# Patient Record
Sex: Female | Born: 1935 | Race: White | Hispanic: No | Smoking: Former smoker
Health system: Southern US, Community
[De-identification: ages and names within clinical notes are randomized; demographics above are authoritative.]

## PROBLEM LIST (undated history)

## (undated) DIAGNOSIS — M1990 Unspecified osteoarthritis, unspecified site: Secondary | ICD-10-CM

## (undated) DIAGNOSIS — C44310 Basal cell carcinoma of skin of unspecified parts of face: Secondary | ICD-10-CM

## (undated) DIAGNOSIS — M545 Low back pain: Secondary | ICD-10-CM

## (undated) DIAGNOSIS — H269 Unspecified cataract: Secondary | ICD-10-CM

## (undated) DIAGNOSIS — IMO0002 Reserved for concepts with insufficient information to code with codable children: Secondary | ICD-10-CM

## (undated) DIAGNOSIS — M810 Age-related osteoporosis without current pathological fracture: Secondary | ICD-10-CM

## (undated) DIAGNOSIS — K219 Gastro-esophageal reflux disease without esophagitis: Secondary | ICD-10-CM

## (undated) DIAGNOSIS — E119 Type 2 diabetes mellitus without complications: Secondary | ICD-10-CM

## (undated) DIAGNOSIS — E785 Hyperlipidemia, unspecified: Secondary | ICD-10-CM

## (undated) DIAGNOSIS — T7840XA Allergy, unspecified, initial encounter: Secondary | ICD-10-CM

## (undated) DIAGNOSIS — D500 Iron deficiency anemia secondary to blood loss (chronic): Secondary | ICD-10-CM

## (undated) DIAGNOSIS — K52832 Lymphocytic colitis: Secondary | ICD-10-CM

## (undated) DIAGNOSIS — IMO0001 Reserved for inherently not codable concepts without codable children: Secondary | ICD-10-CM

## (undated) DIAGNOSIS — I10 Essential (primary) hypertension: Secondary | ICD-10-CM

## (undated) DIAGNOSIS — M549 Dorsalgia, unspecified: Secondary | ICD-10-CM

## (undated) DIAGNOSIS — H9190 Unspecified hearing loss, unspecified ear: Secondary | ICD-10-CM

## (undated) DIAGNOSIS — J301 Allergic rhinitis due to pollen: Secondary | ICD-10-CM

## (undated) DIAGNOSIS — M8580 Other specified disorders of bone density and structure, unspecified site: Secondary | ICD-10-CM

## (undated) DIAGNOSIS — D62 Acute posthemorrhagic anemia: Secondary | ICD-10-CM

## (undated) DIAGNOSIS — K635 Polyp of colon: Secondary | ICD-10-CM

## (undated) DIAGNOSIS — R32 Unspecified urinary incontinence: Secondary | ICD-10-CM

## (undated) HISTORY — DX: Essential (primary) hypertension: I10

## (undated) HISTORY — DX: Unspecified osteoarthritis, unspecified site: M19.90

## (undated) HISTORY — DX: Other specified disorders of bone density and structure, unspecified site: M85.80

## (undated) HISTORY — DX: Acute posthemorrhagic anemia: D62

## (undated) HISTORY — PX: JOINT REPLACEMENT: SHX530

## (undated) HISTORY — DX: Basal cell carcinoma of skin of unspecified parts of face: C44.310

## (undated) HISTORY — DX: Iron deficiency anemia secondary to blood loss (chronic): D50.0

## (undated) HISTORY — PX: TONSILLECTOMY AND ADENOIDECTOMY: SUR1326

## (undated) HISTORY — PX: TOE SURGERY: SHX1073

## (undated) HISTORY — DX: Allergy, unspecified, initial encounter: T78.40XA

## (undated) HISTORY — DX: Gastro-esophageal reflux disease without esophagitis: K21.9

## (undated) HISTORY — DX: Reserved for concepts with insufficient information to code with codable children: IMO0002

## (undated) HISTORY — DX: Type 2 diabetes mellitus without complications: E11.9

## (undated) HISTORY — PX: COLONOSCOPY: SHX174

## (undated) HISTORY — DX: Age-related osteoporosis without current pathological fracture: M81.0

## (undated) HISTORY — PX: OTHER SURGICAL HISTORY: SHX169

## (undated) HISTORY — DX: Lymphocytic colitis: K52.832

## (undated) HISTORY — DX: Low back pain: M54.5

---

## 1998-02-19 ENCOUNTER — Other Ambulatory Visit: Admission: RE | Admit: 1998-02-19 | Discharge: 1998-02-19 | Payer: Self-pay | Admitting: Obstetrics and Gynecology

## 1998-04-23 ENCOUNTER — Other Ambulatory Visit: Admission: RE | Admit: 1998-04-23 | Discharge: 1998-04-23 | Payer: Self-pay | Admitting: Obstetrics and Gynecology

## 1999-03-19 ENCOUNTER — Other Ambulatory Visit: Admission: RE | Admit: 1999-03-19 | Discharge: 1999-03-19 | Payer: Self-pay | Admitting: Obstetrics and Gynecology

## 2000-04-09 ENCOUNTER — Other Ambulatory Visit: Admission: RE | Admit: 2000-04-09 | Discharge: 2000-04-09 | Payer: Self-pay | Admitting: Obstetrics and Gynecology

## 2000-06-03 ENCOUNTER — Ambulatory Visit (HOSPITAL_BASED_OUTPATIENT_CLINIC_OR_DEPARTMENT_OTHER): Admission: RE | Admit: 2000-06-03 | Discharge: 2000-06-03 | Payer: Self-pay | Admitting: Podiatry

## 2001-08-09 ENCOUNTER — Other Ambulatory Visit: Admission: RE | Admit: 2001-08-09 | Discharge: 2001-08-09 | Payer: Self-pay | Admitting: Obstetrics and Gynecology

## 2002-09-29 ENCOUNTER — Ambulatory Visit (HOSPITAL_COMMUNITY): Admission: RE | Admit: 2002-09-29 | Discharge: 2002-09-29 | Payer: Self-pay | Admitting: Otolaryngology

## 2002-09-29 ENCOUNTER — Encounter: Payer: Self-pay | Admitting: Otolaryngology

## 2005-08-13 ENCOUNTER — Encounter: Admission: RE | Admit: 2005-08-13 | Discharge: 2005-08-13 | Payer: Self-pay | Admitting: Neurosurgery

## 2005-08-25 ENCOUNTER — Encounter: Admission: RE | Admit: 2005-08-25 | Discharge: 2005-08-25 | Payer: Self-pay | Admitting: Neurosurgery

## 2005-09-10 ENCOUNTER — Encounter: Admission: RE | Admit: 2005-09-10 | Discharge: 2005-09-10 | Payer: Self-pay | Admitting: Neurosurgery

## 2006-02-02 ENCOUNTER — Ambulatory Visit (HOSPITAL_COMMUNITY): Admission: RE | Admit: 2006-02-02 | Discharge: 2006-02-02 | Payer: Self-pay | Admitting: Neurosurgery

## 2007-02-02 ENCOUNTER — Ambulatory Visit: Payer: Self-pay | Admitting: Internal Medicine

## 2007-02-03 ENCOUNTER — Encounter: Payer: Self-pay | Admitting: Internal Medicine

## 2007-02-16 ENCOUNTER — Encounter: Payer: Self-pay | Admitting: Internal Medicine

## 2007-02-16 ENCOUNTER — Ambulatory Visit (HOSPITAL_COMMUNITY): Admission: RE | Admit: 2007-02-16 | Discharge: 2007-02-16 | Payer: Self-pay | Admitting: Internal Medicine

## 2007-02-16 DIAGNOSIS — K5289 Other specified noninfective gastroenteritis and colitis: Secondary | ICD-10-CM

## 2007-03-03 ENCOUNTER — Ambulatory Visit: Payer: Self-pay | Admitting: Internal Medicine

## 2007-04-07 ENCOUNTER — Ambulatory Visit: Payer: Self-pay | Admitting: Internal Medicine

## 2007-09-09 DIAGNOSIS — I10 Essential (primary) hypertension: Secondary | ICD-10-CM

## 2007-09-09 DIAGNOSIS — D126 Benign neoplasm of colon, unspecified: Secondary | ICD-10-CM

## 2007-09-09 DIAGNOSIS — E785 Hyperlipidemia, unspecified: Secondary | ICD-10-CM | POA: Insufficient documentation

## 2007-09-23 ENCOUNTER — Encounter: Payer: Self-pay | Admitting: Internal Medicine

## 2008-02-29 DIAGNOSIS — M8580 Other specified disorders of bone density and structure, unspecified site: Secondary | ICD-10-CM

## 2008-02-29 HISTORY — DX: Other specified disorders of bone density and structure, unspecified site: M85.80

## 2008-03-15 LAB — HM DEXA SCAN

## 2008-09-27 LAB — HM MAMMOGRAPHY

## 2009-11-07 ENCOUNTER — Encounter: Payer: Self-pay | Admitting: Cardiology

## 2010-08-29 DIAGNOSIS — E119 Type 2 diabetes mellitus without complications: Secondary | ICD-10-CM

## 2010-08-29 HISTORY — DX: Type 2 diabetes mellitus without complications: E11.9

## 2010-11-12 NOTE — Assessment & Plan Note (Signed)
[facility]                         GASTROENTEROLOGY OFFICE NOTE   NAME:Jessica [name], Jessica [name]                     MRN:          [id number]  DATE:04/07/2007                            DOB:          28-Jul-1929    CHIEF COMPLAINT:  Follow up of lymphocytic colitis.   Her colonoscopy on February 16, 2007, revealed some polyps, these were  adenomas, and she had lymphocytic colitis found on her biopsies.  I put  her on Entocort EC and her diarrhea has resolved.  She is currently on 9  mg a day.  She is entering her donut hole with Medicare Part D and it  would be difficult for her to buy this, if she needs to again.   PAST MEDICAL HISTORY:  Reviewed and unchanged.   MEDICATIONS:  Listed and reviewed in the chart.   PHYSICAL EXAM:  A well-developed, elderly white woman, weight 170  pounds, pulse 72, blood pressure 130/70.   ASSESSMENT:  1. Lymphocytic colitis in remission.  2. Adenomatous colon polyps.   PLAN:  Taper the Entocort EC over the next couple of weeks.  If she has  recurrent problems she is to call me.  She may need this again, or if  cost is an issue we could try lower dose prednisone.  I told her that  the clinical course could be that she does well for a very long time  and, perhaps, even forever, or she could have recurrent problems, time  will tell.  I usually do not treat this chronically with Entocort unless  we need to, and there are also other nonsteroid treatments if something  is needed chronically.  I told her that the etiology was not clear.  Regarding her colon polyps, we will plan on a repeat colonoscopy in 3  years.     Iva Boop, MD,FACG  Electronically Signed    CEG/MedQ  DD: 04/07/2007  DT: 04/08/2007  Job #: [id number]   cc:   Jocelyn Lamer D. Pecola Leisure, M.D.

## 2010-11-12 NOTE — Assessment & Plan Note (Signed)
[facility]                         GASTROENTEROLOGY OFFICE NOTE   Jessica [name], Jessica [name]                     MRN:          [id number]  DATE:02/02/2007                            DOB:          [birth year]    CHIEF COMPLAINT:  Diarrhea.   HISTORY:  A 77 year old white woman who, for the past 3 weeks, has had  diarrhea.  She has had chronic constipation controlled by Metamucil.  For some reason, 3-4 weeks ago, she started having 4-6 loose stools a  day.  At that point, her simvastatin was increased from 20-40 mg daily.  It had been incorrectly dispensed at 20 mg, and the pharmacy called her  and they changed it to 40 mg.  There has been no travel, no recent  antibiotics other than Cipro given empirically for the diarrhea by Dr.  Pecola Leisure, her family physician.  She has had CBC on January 20, 2007 which was  normal, except for a slightly elevated RDW at 14.8%, a stool culture is  negative.  No travel, no contacts, no recent antibiotics, otherwise no  contact with hospitalized patients, etc.  Her CMET in June, she had a  BUN 28, total protein 8.5, but otherwise normal.  Triglycerides were 187  and her HDL 50, total cholesterol was 155.  She has used a little bit of  Imodium with some relief.  She stopped that when she took the 5 days of  Cipro which did not help.  She has been on the [facility] Diet for a  year and has lost 30 pounds.  She does not really drink milk, there is  no sugar-free candy.  There was no change in stool caliber or anything  prior to this.  She has never had a colonoscopy.   MEDICATIONS:  1. Nexium 40 mg daily.  2. Lisinopril/hydrochlorothiazide 20/25 daily.  3. Simvastatin 40 mg daily.  4. Dicyclomine 20 mg 4 times a day (did not help).  5. Multivitamin daily.   DRUG ALLERGIES:  None known.   PAST MEDICAL HISTORY:  1. Hypertension.  2. Dyslipidemia.  3. No surgeries reported.   FAMILY HISTORY:  Negative, no colon cancer, no  IBD.   SOCIAL HISTORY:  She is widowed, she lives with her daughter.  No  tobacco or drugs.  She is retired.  She has a son, also.   Some back pain from a herniated disk and eyeglasses are used, otherwise,  review of systems negative.   Note, she does have heartburn intermittently, but this is well-  controlled on Nexium, i.e., the intermittent symptoms were prior to the  Nexium.   PHYSICAL EXAMINATION:  Height 5 feet 4 inches, weight 168 pounds, blood  pressure 138/68, pulse 76.  She is a well developed, well nourished, elderly woman in no acute  distress looking stated age.  EYES:  Anicteric.  MOUTH:  Free of lesions.  NECK:  Supple, no thyromegaly or masses.  CHEST:  Clear.  HEART:  S1, S2.  No murmurs, gallops, or rubs.  ABDOMEN:  Soft, nontender, no organomegaly or mass.  EXTREMITIES:  Free of edema.  LYMPHATIC:  No neck or supraclavicular nodes.  RECTAL EXAM:  Deferred.  SKIN:  No rash.  NEURO/PSYCH:  She is alert and oriented x3.   ASSESSMENT:  Change in bowel habits with a 3-4 week history of diarrhea.  Perhaps it is the simvastatin.  C-diff can be community acquired.  Malignancy must be excluded, i.e., constricting lesion causing diarrhea  is possible in this lady who has never had a colonoscopy at age 34.  Hyperthyroidism could be possible, I suppose, though I would not expect  such a sudden change in her bowels, temporally has been associated with  her simvastatin.   PLAN:  1. Hold the simvastatin.  2. Schedule colonoscopy (needs even if symptoms improve, i.e., over      50, never had a colonoscopy).  3. Loperamide up to 4 a day can be used.  She was only taking 1      before.  4. Discontinue dicyclomine, that was reasonable but it has not helped.  5. Further plans pending clinical course.   Risks, benefits, indications of the procedure explained to the patient.  She understands and agrees to proceed.     Iva Boop, MD,FACG  Electronically  Signed    CEG/MedQ  DD: 02/02/2007  DT: 02/02/2007  Job #: [id number]   cc:   Jocelyn Lamer D. Pecola Leisure, M.D.

## 2011-01-10 ENCOUNTER — Other Ambulatory Visit: Payer: Self-pay | Admitting: Neurosurgery

## 2011-01-10 DIAGNOSIS — M25551 Pain in right hip: Secondary | ICD-10-CM

## 2011-01-16 ENCOUNTER — Ambulatory Visit
Admission: RE | Admit: 2011-01-16 | Discharge: 2011-01-16 | Disposition: A | Discharge status: Home / Self Care | Payer: Medicare Other | Source: Ambulatory Visit | Attending: Neurosurgery | Admitting: Neurosurgery

## 2011-01-16 DIAGNOSIS — M25551 Pain in right hip: Secondary | ICD-10-CM

## 2011-06-17 ENCOUNTER — Ambulatory Visit (INDEPENDENT_AMBULATORY_CARE_PROVIDER_SITE_OTHER): Payer: Medicare Other | Admitting: Physician Assistant

## 2011-06-17 DIAGNOSIS — I10 Essential (primary) hypertension: Secondary | ICD-10-CM

## 2011-06-17 DIAGNOSIS — Z79899 Other long term (current) drug therapy: Secondary | ICD-10-CM

## 2011-06-17 DIAGNOSIS — E119 Type 2 diabetes mellitus without complications: Secondary | ICD-10-CM

## 2011-07-14 ENCOUNTER — Encounter: Payer: Self-pay | Admitting: Physician Assistant

## 2011-07-14 DIAGNOSIS — M1990 Unspecified osteoarthritis, unspecified site: Secondary | ICD-10-CM | POA: Insufficient documentation

## 2011-07-14 DIAGNOSIS — R7303 Prediabetes: Secondary | ICD-10-CM | POA: Insufficient documentation

## 2011-07-14 DIAGNOSIS — IMO0002 Reserved for concepts with insufficient information to code with codable children: Secondary | ICD-10-CM | POA: Insufficient documentation

## 2011-07-14 DIAGNOSIS — E119 Type 2 diabetes mellitus without complications: Secondary | ICD-10-CM | POA: Insufficient documentation

## 2011-07-14 DIAGNOSIS — M8580 Other specified disorders of bone density and structure, unspecified site: Secondary | ICD-10-CM | POA: Insufficient documentation

## 2011-07-14 DIAGNOSIS — K219 Gastro-esophageal reflux disease without esophagitis: Secondary | ICD-10-CM | POA: Insufficient documentation

## 2011-09-12 ENCOUNTER — Other Ambulatory Visit: Payer: Self-pay | Admitting: Physician Assistant

## 2011-09-15 ENCOUNTER — Encounter (HOSPITAL_COMMUNITY): Payer: Self-pay | Admitting: Pharmacy Technician

## 2011-09-17 NOTE — Pre-Procedure Instructions (Signed)
20 Jessica [name]  09/17/2011   Your procedure is scheduled on: Wednesday March 27th  Report to [facility] at 9:00 AM.  Call this number if you have problems the morning of surgery: [phone]   Remember:   Do not eat food:After Midnight.  May have clear liquids: up to 4 Hours before arrival (5:00am).  Clear liquids include soda, tea, black coffee, apple or grape juice, broth.  Take these medicines the morning of surgery with A SIP OF WATER: Nexium (esomeprazole), Norvasc (amlodipine)   Do not wear jewelry, make-up or nail polish.  Do not wear lotions, powders, or perfumes. You may wear deodorant.  Do not shave 48 hours prior to surgery.  Do not bring valuables to the hospital.  Contacts, dentures or bridgework may not be worn into surgery.  Leave suitcase in the car. After surgery it may be brought to your room.  For patients admitted to the hospital, checkout time is 11:00 AM the day of discharge.   Patients discharged the day of surgery will not be allowed to drive home.    Special Instructions: CHG Shower Use Special Wash: 1/2 bottle night before surgery and 1/2 bottle morning of surgery.   Please read over the following fact sheets that you were given: Pain Booklet, Coughing and Deep Breathing, Blood Transfusion Information, Total Joint Packet, MRSA Information and Surgical Site Infection Prevention

## 2011-09-18 ENCOUNTER — Encounter (HOSPITAL_COMMUNITY)
Admission: RE | Admit: 2011-09-18 | Discharge: 2011-09-18 | Disposition: A | Discharge status: Home / Self Care | Payer: Medicare Other | Source: Ambulatory Visit | Attending: Surgery | Admitting: Surgery

## 2011-09-18 ENCOUNTER — Encounter (HOSPITAL_COMMUNITY): Payer: Self-pay

## 2011-09-18 ENCOUNTER — Ambulatory Visit (INDEPENDENT_AMBULATORY_CARE_PROVIDER_SITE_OTHER): Payer: Medicare Other | Admitting: Cardiology

## 2011-09-18 ENCOUNTER — Encounter (HOSPITAL_COMMUNITY)
Admission: RE | Admit: 2011-09-18 | Discharge: 2011-09-18 | Disposition: A | Discharge status: Home / Self Care | Payer: Medicare Other | Source: Ambulatory Visit | Attending: Orthopedic Surgery | Admitting: Orthopedic Surgery

## 2011-09-18 ENCOUNTER — Other Ambulatory Visit: Payer: Self-pay

## 2011-09-18 ENCOUNTER — Encounter: Payer: Self-pay | Admitting: Cardiology

## 2011-09-18 VITALS — BP 126/70 | HR 88 | Ht 64.0 in | Wt 178.0 lb

## 2011-09-18 DIAGNOSIS — E785 Hyperlipidemia, unspecified: Secondary | ICD-10-CM

## 2011-09-18 DIAGNOSIS — Z01810 Encounter for preprocedural cardiovascular examination: Secondary | ICD-10-CM

## 2011-09-18 DIAGNOSIS — I10 Essential (primary) hypertension: Secondary | ICD-10-CM

## 2011-09-18 HISTORY — DX: Dorsalgia, unspecified: M54.9

## 2011-09-18 HISTORY — DX: Unspecified hearing loss, unspecified ear: H91.90

## 2011-09-18 HISTORY — DX: Reserved for inherently not codable concepts without codable children: IMO0001

## 2011-09-18 HISTORY — DX: Unspecified urinary incontinence: R32

## 2011-09-18 HISTORY — DX: Unspecified cataract: H26.9

## 2011-09-18 HISTORY — DX: Hyperlipidemia, unspecified: E78.5

## 2011-09-18 HISTORY — DX: Polyp of colon: K63.5

## 2011-09-18 HISTORY — DX: Allergic rhinitis due to pollen: J30.1

## 2011-09-18 LAB — COMPREHENSIVE METABOLIC PANEL
Albumin: 4.6 g/dL (ref 3.5–5.2)
BUN: 27 mg/dL — ABNORMAL HIGH (ref 6–23)
CO2: 25 mEq/L (ref 19–32)
Calcium: 10.4 mg/dL (ref 8.4–10.5)
Chloride: 98 mEq/L (ref 96–112)
Creatinine, Ser: 0.83 mg/dL (ref 0.50–1.10)
GFR calc non Af Amer: 65 mL/min — ABNORMAL LOW (ref >90)
Total Bilirubin: 0.4 mg/dL (ref 0.3–1.2)

## 2011-09-18 LAB — CBC
HCT: 38.4 % (ref 36.0–46.0)
MCH: 31 pg (ref 26.0–34.0)
MCV: 90.1 fL (ref 78.0–100.0)
RDW: 14 % (ref 11.5–15.5)
WBC: 12.5 10*3/uL — ABNORMAL HIGH (ref 4.0–10.5)

## 2011-09-18 LAB — PROTIME-INR
INR: 1.04 (ref 0.00–1.49)
Prothrombin Time: 13.8 seconds (ref 11.6–15.2)

## 2011-09-18 LAB — URINALYSIS, ROUTINE W REFLEX MICROSCOPIC
Bilirubin Urine: NEGATIVE
Glucose, UA: NEGATIVE mg/dL
Ketones, ur: NEGATIVE mg/dL
pH: 5.5 (ref 5.0–8.0)

## 2011-09-18 LAB — SURGICAL PCR SCREEN: MRSA, PCR: NEGATIVE

## 2011-09-18 LAB — TYPE AND SCREEN: ABO/RH(D): O POS

## 2011-09-18 LAB — URINE MICROSCOPIC-ADD ON

## 2011-09-18 LAB — ABO/RH: ABO/RH(D): O POS

## 2011-09-18 NOTE — Pre-Procedure Instructions (Signed)
20 Jessica [name]  09/18/2011   Your procedure is scheduled on:  Wed, Mar 27 @ 1055 AM  Report to [facility] at 800 AM.  Call this number if you have problems the morning of surgery: [phone]   Remember:   Do not eat food:After Midnight.  May have clear liquids: up to 4 Hours before arrival.(until 4:00 am)  Clear liquids include soda, tea, black coffee, apple or grape juice, broth,water  Take these medicines the morning of surgery with A SIP OF WATER: Amlodipine and Nexium   Do not wear jewelry, make-up or nail polish.  Do not wear lotions, powders, or perfumes.   Do not shave 48 hours prior to surgery.  Do not bring valuables to the hospital.  Contacts, dentures or bridgework may not be worn into surgery.  Leave suitcase in the car. After surgery it may be brought to your room.  For patients admitted to the hospital, checkout time is 11:00 AM the day of discharge.   Special Instructions: CHG Shower Use Special Wash: 1/2 bottle night before surgery and 1/2 bottle morning of surgery.   Please read over the following fact sheets that you were given: Pain Booklet, Coughing and Deep Breathing, Blood Transfusion Information, Total Joint Packet, MRSA Information and Surgical Site Infection Prevention

## 2011-09-18 NOTE — Assessment & Plan Note (Addendum)
The patient has a moderate functional level. She has no high-risk features or findings according to guidelines. Therefore, according to ACC/AHA guidelines the patient is at acceptable risk for the planned hip surgery. Of note I will review a stress perfusion study done in the last few years for completeness. However, this would not change the above opinion if, as the patient was told, this did not have any high risk findings.  I DID REVIEW THE STRESS TEST FROM 11/07/2009 DONE AT [facility].  THIS WAS REPORTED AS A NORMAL MYOCARDIAL PERFUSION STUDY.  09/19/2011

## 2011-09-18 NOTE — Progress Notes (Signed)
Called to Dr.Murphy's office to verify if knee or thigh ted hose.Order in the system for teds but doesn't specify which length

## 2011-09-18 NOTE — Progress Notes (Signed)
HPI The patient presents for preoperative clearance. She has no prior cardiac history but she does have multiple cardiovascular risk factors. She does report having a stress perfusion study approximately 3 years ago at another practice. She was told this was normal. She is not having any cardiovascular symptoms. In particular she denies any chest pressure, neck or arm discomfort. She does not have palpitations, presyncope or syncope. She has not had PND or orthopnea. She's able to do activities such as vacuuming or walking to the grocery store without limitations (greater than 5 METS).    No Known Allergies  Current Outpatient Prescriptions  Medication Sig Dispense Refill  . amLODipine (NORVASC) 5 MG tablet Take 5 mg by mouth every morning.      Marland Kitchen esomeprazole (NEXIUM) 40 MG capsule Take 40 mg by mouth daily before supper.      Marland Kitchen lisinopril-hydrochlorothiazide (PRINZIDE,ZESTORETIC) 20-12.5 MG per tablet Take 2 tablets by mouth every morning.      . Multiple Vitamin (MULITIVITAMIN WITH MINERALS) TABS Take 1 tablet by mouth daily.      . pravastatin (PRAVACHOL) 20 MG tablet Take 20 mg by mouth every evening.        Past Medical History  Diagnosis Date  . Lymphocytic colitis   . Pain, lumbar region     partially herniated disc, s/p steroid injection 2007  . Cystocele   . Low bone mass 02/2008  . Type II or unspecified type diabetes mellitus without mention of complication, not stated as uncontrolled 08/2010  . Essential hypertension, benign     takes Amlodipine and Prinizide daily  . Hyperlipidemia     takes Pravastatin daily  . GERD (gastroesophageal reflux disease)     takes Nexium daily  . Hay fever   . Osteoarthritis     hip, knee; severe  . Back pain     herniated disc  . Constipation   . Colon polyps   . Urinary frequency   . Incontinence     occasionally  . Urinary urgency   . Cataracts, bilateral   . Glaucoma   . Impaired hearing     Past Surgical History    Procedure Date  . Toe surgery     bilateral  . Colonoscopy     Family History  Problem Relation Age of Onset  . Stroke Mother 52  . Anesthesia problems Neg Hx   . Hypotension Neg Hx   . Pseudochol deficiency Neg Hx   . Malignant hyperthermia Neg Hx     History   Social History  . Marital Status: Widowed    Spouse Name: N/A    Number of Children: N/A  . Years of Education: N/A   Occupational History  . retired Print production planner    Social History Main Topics  . Smoking status: Never Smoker   . Smokeless tobacco: Not on file   Comment: quit in 1997  . Alcohol Use: No  . Drug Use: No  . Sexually Active: No   Other Topics Concern  . Not on file   Social History Narrative  . No narrative on file    ROS: As stated in the HPI and negative for all other systems.  PHYSICAL EXAM BP 126/70  Pulse 88  Ht 5\' 4"  (1.626 m)  Wt 178 lb (80.74 kg)  BMI 30.55 kg/m2 GENERAL:  Well appearing HEENT:  Pupils equal round and reactive, fundi not visualized, oral mucosa unremarkable NECK:  No jugular venous distention, waveform within normal  limits, carotid upstroke brisk and symmetric, no bruits, no thyromegaly LYMPHATICS:  No cervical, inguinal adenopathy LUNGS:  Clear to auscultation bilaterally BACK:  No CVA tenderness CHEST:  Unremarkable HEART:  PMI not displaced or sustained,S1 and S2 within normal limits, no S3, no S4, no clicks, no rubs, no murmurs ABD:  Flat, positive bowel sounds normal in frequency in pitch, no bruits, no rebound, no guarding, no midline pulsatile mass, no hepatomegaly, no splenomegaly, obese EXT:  2 plus pulses throughout, no edema, no cyanosis no clubbing SKIN:  No rashes no nodules NEURO:  Cranial nerves II through XII grossly intact, motor grossly intact throughout PSYCH:  Cognitively intact, oriented to person place and time  EKG:  Sinus rhythm, rate 86, axis within normal limits, intervals within normal limits, no acute ST-T wave changes.  PACs   09/18/2011  ASSESSMENT AND PLAN

## 2011-09-18 NOTE — Assessment & Plan Note (Signed)
The blood pressure is at target. No change in medications is indicated. We will continue with therapeutic lifestyle changes (TLC).  

## 2011-09-18 NOTE — Assessment & Plan Note (Signed)
This is followed by her primary provider.

## 2011-09-18 NOTE — Progress Notes (Signed)
Pt going this afternoon to see Dr.Hochreine per Dr.Murphy request  Stress test done about 42yrs ago at [facility] request report  Denies ever having a heart cath or an echo

## 2011-09-19 ENCOUNTER — Telehealth: Payer: Self-pay | Admitting: Cardiology

## 2011-09-19 NOTE — Telephone Encounter (Signed)
Pt aware that she has been cleared for surgery.  Faxed to [facility]

## 2011-09-19 NOTE — Telephone Encounter (Signed)
Spoke with Lavonna Rua - she is aware we are waiting for the results of the stress testing done at [facility] 3 yrs ago.

## 2011-09-19 NOTE — Telephone Encounter (Signed)
New problem:  Per Sheri   From office notes on 3/21, can patient be clear for surgery. Schedule on 3/27.

## 2011-09-22 NOTE — Consult Note (Signed)
Anesthesia:  Patient is a 78 year old female scheduled for right THR on 09/24/11.  History includes former smoker, DM2, HLD, HTN, lymphocytic colitis, OA, GERD, cataracts, impaired hearing, cystocele, glaucoma.  She was evaluated by Cardiologist Dr. Antoine Poche on 09/18/11 for a preoperative evaluation.  According to his note, "The patient has a moderate functional level. She has no high-risk features or findings according to guidelines. Therefore, according to ACC/AHA guidelines the patient is at acceptable risk for the planned hip surgery. Of note I will review a stress perfusion study done in the last few years for completeness. However, this would not change the above opinion if, as the patient was told, this did not have any high risk findings. I DID REVIEW THE STRESS TEST FROM 11/07/2009 DONE AT [facility]. THIS WAS REPORTED AS A NORMAL MYOCARDIAL PERFUSION STUDY. 09/19/2011."  EF was estimated at 86% at that time.  EKG from 09/18/11 showed SR with marked sinus arrhythmia and PACs, non-specific ST abnormality.  CXR on 09/18/11 showed: No acute infiltrate or pulmonary edema. Moderate sized hiatal hernia.   Labs acceptable.  Plan to proceed.

## 2011-09-22 NOTE — H&P (Signed)
  [facility] [address]   [address] [place], [place] [address] [phone] A Division of [facility] Loreta Ave, M.D.     Robert A. Thurston Hole, M.D.     Lunette Stands, M.D. Eulas Post, M.D.    Buford Dresser, M.D. Estell Harpin, M.D. Ralene Cork, D.O.          Genene Churn. Barry Dienes, PA-C            Kirstin A. Shepperson, PA-C [place], OPA-C RE: Jessica, [name]   [id number]      DOB: [birth year] PROGRESS NOTE: 09-12-11 Chief complaint right hip pain. History of present illness: 78 year old white female with history of end stage right hip degenerative joint disease. Symptoms are unchanged and she wants to proceed with total hip replacement as scheduled. She did not be seen by her primary care physician for pre-op clearance. She had stress test couple years ago but we do not have results of that. She has history of right knee degenerative joint disease and wants injection today. Last injection 8/12 gave good relief. Current medications: Lisinopril/HCTZ Amlodipine Nexium Pravastatin and Centrum. No known drug allergies. Past medical/surgical history hypertension GERD hypercholesterolemia. Family history positive heart disease hypertension stroke. Social history she is retired denies alcohol use quit smoking in 1997. Review of systems: nocturia constipation nervousness decrease hearing tinnitus. No fever chills lightheadedness dizziness cardiac pulmonary issues. No blood in stool or urine .   EXAMINATION: Height 5'4" 130 pounds blood pressure 150/79 pulse 81 temp 97.8. Alert and oriented x3 in no acute distress. Skin warm and dry. No increase in respiratory effort. Gait is antalgic. Head normal cephalic atraumatic PERRLA Extraocular motion is intact. Cervical spine unremarkable. Lungs CTA bilaterally. No wheeze noted. Heart regular rate and rhythm S1 S2. No murmurs. Abdomen round non-distended. NABS x4. Soft non-tender.  Negative straight leg raise. Right hip 5 degrees internal and external rotation with marked discomfort limited flexion. Bilateral calves non-tender neurovascularly intact.  IMPRESSION: Right hip degenerative joint disease. Right knee degenerative joint disease.  DISPOSITION: Will proceed with right total hip replacement as scheduled. Discussed risks benefits and possible complications in detail. All questions answered. She'll do rehab at [facility]. For her right knee we did proceed with injection. Advise patient we will be needing something from her primary care physician stating she's medically stable.   PROCEDURE: The patient's clinical condition is marked by substantial pain and/or significant functional disability. Other conservative therapy has not provided relief, is contraindicated, or not appropriate. There is a reasonable likelihood that injection will significantly improve the patient's pain and/or functional disability.  After routine sterile prep of the knee with use of ethyl chloride and 1% plain Xylocaine the knee is injected with Depo-Medrol/Marcaine, accomplished atraumatically.  Patient tolerated the procedure well.  Loreta Ave, M.D. Electronically verified by Loreta Ave, M.D. DFM(JMO):kh Cc:  Ellamae Sia, MD fax [phone]  D 09-14-01    T 09-14-01

## 2011-09-23 ENCOUNTER — Ambulatory Visit: Payer: Medicare Other | Admitting: Physician Assistant

## 2011-09-23 MED ORDER — CEFAZOLIN SODIUM-DEXTROSE 2-3 GM-% IV SOLR
2.0000 g | INTRAVENOUS | Status: AC
  Administered 2011-09-24: 2 g via INTRAVENOUS
  Filled 2011-09-23: qty 50

## 2011-09-24 ENCOUNTER — Inpatient Hospital Stay (HOSPITAL_COMMUNITY)
Admission: RE | Admit: 2011-09-24 | Discharge: 2011-09-26 | DRG: 470 | Disposition: A | Discharge status: Skilled Nursing Facility | Payer: Medicare Other | Source: Ambulatory Visit | Attending: Orthopedic Surgery | Admitting: Orthopedic Surgery

## 2011-09-24 ENCOUNTER — Encounter (HOSPITAL_COMMUNITY)
Admission: RE | Disposition: A | Discharge status: Skilled Nursing Facility | Payer: Self-pay | Source: Ambulatory Visit | Attending: Orthopedic Surgery

## 2011-09-24 ENCOUNTER — Ambulatory Visit (HOSPITAL_COMMUNITY): Payer: Medicare Other | Admitting: Vascular Surgery

## 2011-09-24 ENCOUNTER — Encounter (HOSPITAL_COMMUNITY): Payer: Self-pay | Admitting: Vascular Surgery

## 2011-09-24 ENCOUNTER — Ambulatory Visit (HOSPITAL_COMMUNITY): Payer: Medicare Other

## 2011-09-24 ENCOUNTER — Encounter (HOSPITAL_COMMUNITY): Payer: Self-pay | Admitting: *Deleted

## 2011-09-24 ENCOUNTER — Encounter (HOSPITAL_COMMUNITY): Payer: Self-pay

## 2011-09-24 DIAGNOSIS — Z823 Family history of stroke: Secondary | ICD-10-CM

## 2011-09-24 DIAGNOSIS — M169 Osteoarthritis of hip, unspecified: Principal | ICD-10-CM | POA: Diagnosis present

## 2011-09-24 DIAGNOSIS — E119 Type 2 diabetes mellitus without complications: Secondary | ICD-10-CM | POA: Diagnosis present

## 2011-09-24 DIAGNOSIS — Z860100 Personal history of colon polyps, unspecified: Secondary | ICD-10-CM

## 2011-09-24 DIAGNOSIS — Z471 Aftercare following joint replacement surgery: Secondary | ICD-10-CM

## 2011-09-24 DIAGNOSIS — Z7901 Long term (current) use of anticoagulants: Secondary | ICD-10-CM

## 2011-09-24 DIAGNOSIS — M1710 Unilateral primary osteoarthritis, unspecified knee: Secondary | ICD-10-CM | POA: Diagnosis present

## 2011-09-24 DIAGNOSIS — I10 Essential (primary) hypertension: Secondary | ICD-10-CM | POA: Diagnosis present

## 2011-09-24 DIAGNOSIS — Z01810 Encounter for preprocedural cardiovascular examination: Secondary | ICD-10-CM

## 2011-09-24 DIAGNOSIS — Z87891 Personal history of nicotine dependence: Secondary | ICD-10-CM

## 2011-09-24 DIAGNOSIS — D62 Acute posthemorrhagic anemia: Secondary | ICD-10-CM | POA: Diagnosis present

## 2011-09-24 DIAGNOSIS — K219 Gastro-esophageal reflux disease without esophagitis: Secondary | ICD-10-CM | POA: Diagnosis present

## 2011-09-24 DIAGNOSIS — E785 Hyperlipidemia, unspecified: Secondary | ICD-10-CM | POA: Diagnosis present

## 2011-09-24 DIAGNOSIS — Z8249 Family history of ischemic heart disease and other diseases of the circulatory system: Secondary | ICD-10-CM

## 2011-09-24 DIAGNOSIS — M1610 Unilateral primary osteoarthritis, unspecified hip: Principal | ICD-10-CM | POA: Diagnosis present

## 2011-09-24 DIAGNOSIS — M1990 Unspecified osteoarthritis, unspecified site: Secondary | ICD-10-CM | POA: Diagnosis present

## 2011-09-24 HISTORY — PX: TOTAL HIP ARTHROPLASTY: SHX124

## 2011-09-24 LAB — GLUCOSE, CAPILLARY: Glucose-Capillary: 112 mg/dL — ABNORMAL HIGH (ref 70–99)

## 2011-09-24 SURGERY — ARTHROPLASTY, HIP, TOTAL,POSTERIOR APPROACH
Anesthesia: General | Site: Hip | Laterality: Right | Wound class: Clean

## 2011-09-24 MED ORDER — FENTANYL CITRATE 0.05 MG/ML IJ SOLN
INTRAMUSCULAR | Status: DC | PRN
  Administered 2011-09-24 (×3): 50 ug via INTRAVENOUS
  Administered 2011-09-24: 100 ug via INTRAVENOUS
  Administered 2011-09-24 (×5): 50 ug via INTRAVENOUS

## 2011-09-24 MED ORDER — ONDANSETRON HCL 4 MG/2ML IJ SOLN: 4.0000 mg | Freq: Once | INTRAMUSCULAR | Status: DC | PRN

## 2011-09-24 MED ORDER — PROPOFOL 10 MG/ML IV EMUL
INTRAVENOUS | Status: DC | PRN
  Administered 2011-09-24: 200 mg via INTRAVENOUS

## 2011-09-24 MED ORDER — DOCUSATE SODIUM 100 MG PO CAPS
100.0000 mg | ORAL_CAPSULE | Freq: Two times a day (BID) | ORAL | Status: DC
  Administered 2011-09-24 – 2011-09-26 (×4): 100 mg via ORAL
  Filled 2011-09-24 (×5): qty 1

## 2011-09-24 MED ORDER — ROCURONIUM BROMIDE 100 MG/10ML IV SOLN
INTRAVENOUS | Status: DC | PRN
  Administered 2011-09-24: 50 mg via INTRAVENOUS

## 2011-09-24 MED ORDER — WARFARIN SODIUM 4 MG PO TABS
4.0000 mg | ORAL_TABLET | Freq: Every day | ORAL | Status: DC
  Administered 2011-09-24 – 2011-09-25 (×2): 4 mg via ORAL
  Filled 2011-09-24 (×3): qty 1

## 2011-09-24 MED ORDER — HYDROMORPHONE HCL PF 1 MG/ML IJ SOLN
INTRAMUSCULAR | Status: AC
  Filled 2011-09-24: qty 1

## 2011-09-24 MED ORDER — SODIUM CHLORIDE 0.9 % IR SOLN
Status: DC | PRN
  Administered 2011-09-24: 1000 mL

## 2011-09-24 MED ORDER — ACETAMINOPHEN 650 MG RE SUPP: 650.0000 mg | Freq: Four times a day (QID) | RECTAL | Status: DC | PRN

## 2011-09-24 MED ORDER — AMLODIPINE BESYLATE 5 MG PO TABS
5.0000 mg | ORAL_TABLET | Freq: Every day | ORAL | Status: DC
  Administered 2011-09-25 – 2011-09-26 (×2): 5 mg via ORAL
  Filled 2011-09-24 (×2): qty 1

## 2011-09-24 MED ORDER — LIDOCAINE HCL (CARDIAC) 20 MG/ML IV SOLN
INTRAVENOUS | Status: DC | PRN
  Administered 2011-09-24: 80 mg via INTRAVENOUS

## 2011-09-24 MED ORDER — SENNOSIDES-DOCUSATE SODIUM 8.6-50 MG PO TABS: 1.0000 | ORAL_TABLET | Freq: Every evening | ORAL | Status: DC | PRN

## 2011-09-24 MED ORDER — PHENOL 1.4 % MT LIQD: 1.0000 | OROMUCOSAL | Status: DC | PRN

## 2011-09-24 MED ORDER — FLEET ENEMA 7-19 GM/118ML RE ENEM: 1.0000 | ENEMA | Freq: Once | RECTAL | Status: AC | PRN

## 2011-09-24 MED ORDER — SIMVASTATIN 10 MG PO TABS
10.0000 mg | ORAL_TABLET | Freq: Every day | ORAL | Status: DC
  Administered 2011-09-24 – 2011-09-25 (×2): 10 mg via ORAL
  Filled 2011-09-24 (×3): qty 1

## 2011-09-24 MED ORDER — MIDAZOLAM HCL 5 MG/5ML IJ SOLN
INTRAMUSCULAR | Status: DC | PRN
  Administered 2011-09-24: 2 mg via INTRAVENOUS

## 2011-09-24 MED ORDER — LACTATED RINGERS IV SOLN
INTRAVENOUS | Status: DC | PRN
  Administered 2011-09-24 (×2): via INTRAVENOUS

## 2011-09-24 MED ORDER — CEFAZOLIN SODIUM 1-5 GM-% IV SOLN
1.0000 g | Freq: Three times a day (TID) | INTRAVENOUS | Status: DC
  Administered 2011-09-24 – 2011-09-25 (×2): 1 g via INTRAVENOUS
  Filled 2011-09-24 (×3): qty 50

## 2011-09-24 MED ORDER — ACETAMINOPHEN 10 MG/ML IV SOLN
1000.0000 mg | Freq: Four times a day (QID) | INTRAVENOUS | Status: DC
  Administered 2011-09-24: 1000 mg via INTRAVENOUS

## 2011-09-24 MED ORDER — COUMADIN BOOK
Freq: Once | Status: DC
  Filled 2011-09-24: qty 1

## 2011-09-24 MED ORDER — HYDROMORPHONE HCL PF 1 MG/ML IJ SOLN
0.2500 mg | INTRAMUSCULAR | Status: DC | PRN
  Administered 2011-09-24 (×2): 0.5 mg via INTRAVENOUS

## 2011-09-24 MED ORDER — ACETAMINOPHEN 325 MG PO TABS: 650.0000 mg | ORAL_TABLET | Freq: Four times a day (QID) | ORAL | Status: DC | PRN

## 2011-09-24 MED ORDER — WARFARIN VIDEO: Freq: Once | Status: DC

## 2011-09-24 MED ORDER — PANTOPRAZOLE SODIUM 40 MG PO TBEC
40.0000 mg | DELAYED_RELEASE_TABLET | Freq: Every day | ORAL | Status: DC
  Administered 2011-09-25: 40 mg via ORAL
  Filled 2011-09-24: qty 1

## 2011-09-24 MED ORDER — BUPIVACAINE HCL 0.5 % IJ SOLN
INTRAMUSCULAR | Status: DC | PRN
  Administered 2011-09-24: 10 mL

## 2011-09-24 MED ORDER — ONDANSETRON HCL 4 MG PO TABS: 4.0000 mg | ORAL_TABLET | Freq: Four times a day (QID) | ORAL | Status: DC | PRN

## 2011-09-24 MED ORDER — POTASSIUM CHLORIDE IN NACL 20-0.9 MEQ/L-% IV SOLN
INTRAVENOUS | Status: DC
  Administered 2011-09-24: 1000 mL via INTRAVENOUS
  Administered 2011-09-25 – 2011-09-26 (×2): via INTRAVENOUS
  Filled 2011-09-24 (×7): qty 1000

## 2011-09-24 MED ORDER — LACTATED RINGERS IV SOLN
INTRAVENOUS | Status: DC
  Administered 2011-09-24: 09:00:00 via INTRAVENOUS

## 2011-09-24 MED ORDER — WARFARIN - PHARMACIST DOSING INPATIENT: Freq: Every day | Status: DC

## 2011-09-24 MED ORDER — HYDROMORPHONE HCL PF 1 MG/ML IJ SOLN
0.5000 mg | INTRAMUSCULAR | Status: DC | PRN
  Administered 2011-09-24 – 2011-09-25 (×3): 1 mg via INTRAVENOUS
  Filled 2011-09-24 (×3): qty 1

## 2011-09-24 MED ORDER — MENTHOL 3 MG MT LOZG
1.0000 | LOZENGE | OROMUCOSAL | Status: DC | PRN
  Filled 2011-09-24 (×2): qty 9

## 2011-09-24 MED ORDER — ENOXAPARIN SODIUM 40 MG/0.4ML ~~LOC~~ SOLN
40.0000 mg | SUBCUTANEOUS | Status: DC
  Administered 2011-09-25 – 2011-09-26 (×2): 40 mg via SUBCUTANEOUS
  Filled 2011-09-24 (×3): qty 0.4

## 2011-09-24 MED ORDER — HYDROCODONE-ACETAMINOPHEN 10-325 MG PO TABS
1.0000 | ORAL_TABLET | ORAL | Status: DC | PRN
  Administered 2011-09-24 (×2): 2 via ORAL
  Administered 2011-09-25 (×2): 1 via ORAL
  Administered 2011-09-25: 2 via ORAL
  Administered 2011-09-26 (×3): 1 via ORAL
  Filled 2011-09-24 (×2): qty 1
  Filled 2011-09-24 (×3): qty 2
  Filled 2011-09-24 (×2): qty 1
  Filled 2011-09-24 (×2): qty 2

## 2011-09-24 MED ORDER — ONDANSETRON HCL 4 MG/2ML IJ SOLN
INTRAMUSCULAR | Status: DC | PRN
  Administered 2011-09-24: 4 mg via INTRAVENOUS

## 2011-09-24 MED ORDER — ONDANSETRON HCL 4 MG/2ML IJ SOLN
4.0000 mg | Freq: Four times a day (QID) | INTRAMUSCULAR | Status: DC | PRN
  Administered 2011-09-25 (×3): 4 mg via INTRAVENOUS
  Filled 2011-09-24 (×3): qty 2

## 2011-09-24 SURGICAL SUPPLY — 61 items
BLADE SAW SAG 73X25 THK (BLADE) ×1
BLADE SAW SGTL 73X25 THK (BLADE) ×1 IMPLANT
BOOTCOVER CLEANROOM LRG (PROTECTIVE WEAR) ×4 IMPLANT
BRUSH FEMORAL CANAL (MISCELLANEOUS) IMPLANT
CLOTH BEACON ORANGE TIMEOUT ST (SAFETY) ×2 IMPLANT
COVER BACK TABLE 24X17X13 BIG (DRAPES) IMPLANT
COVER SURGICAL LIGHT HANDLE (MISCELLANEOUS) ×2 IMPLANT
DRAPE INCISE IOBAN 66X45 STRL (DRAPES) IMPLANT
DRAPE ORTHO SPLIT 77X108 STRL (DRAPES) ×4
DRAPE SURG ORHT 6 SPLT 77X108 (DRAPES) ×2 IMPLANT
DRAPE U-SHAPE 47X51 STRL (DRAPES) ×2 IMPLANT
DRILL BIT 7/64X5 (BIT) ×2 IMPLANT
DRSG PAD ABDOMINAL 8X10 ST (GAUZE/BANDAGES/DRESSINGS) ×3 IMPLANT
DURAPREP 26ML APPLICATOR (WOUND CARE) ×2 IMPLANT
ELECT BLADE 6.5 EXT (BLADE) IMPLANT
ELECT CAUTERY BLADE 6.4 (BLADE) ×2 IMPLANT
ELECT REM PT RETURN 9FT ADLT (ELECTROSURGICAL) ×2
ELECTRODE REM PT RTRN 9FT ADLT (ELECTROSURGICAL) ×1 IMPLANT
EVACUATOR 1/8 PVC DRAIN (DRAIN) IMPLANT
FACESHIELD LNG OPTICON STERILE (SAFETY) ×4 IMPLANT
GAUZE XEROFORM 5X9 LF (GAUZE/BANDAGES/DRESSINGS) ×2 IMPLANT
GLOVE BIOGEL PI IND STRL 8 (GLOVE) ×1 IMPLANT
GLOVE BIOGEL PI INDICATOR 8 (GLOVE) ×1
GLOVE ORTHO TXT STRL SZ7.5 (GLOVE) ×4 IMPLANT
GOWN PREVENTION PLUS XLARGE (GOWN DISPOSABLE) ×2 IMPLANT
GOWN STRL NON-REIN LRG LVL3 (GOWN DISPOSABLE) ×2 IMPLANT
GOWN STRL REIN 2XL XLG LVL4 (GOWN DISPOSABLE) ×2 IMPLANT
HANDPIECE INTERPULSE COAX TIP (DISPOSABLE)
KIT BASIN OR (CUSTOM PROCEDURE TRAY) ×2 IMPLANT
KIT ROOM TURNOVER OR (KITS) ×2 IMPLANT
MANIFOLD NEPTUNE II (INSTRUMENTS) ×1 IMPLANT
NDL HYPO 25GX1X1/2 BEV (NEEDLE) ×1 IMPLANT
NEEDLE HYPO 25GX1X1/2 BEV (NEEDLE) ×2 IMPLANT
NS IRRIG 1000ML POUR BTL (IV SOLUTION) ×2 IMPLANT
PACK TOTAL JOINT (CUSTOM PROCEDURE TRAY) ×2 IMPLANT
PAD ARMBOARD 7.5X6 YLW CONV (MISCELLANEOUS) ×4 IMPLANT
PASSER SUT SWANSON 36MM LOOP (INSTRUMENTS) ×2 IMPLANT
PILLOW ABDUCTION HIP (SOFTGOODS) ×2 IMPLANT
PRESSURIZER FEMORAL UNIV (MISCELLANEOUS) IMPLANT
SET HNDPC FAN SPRY TIP SCT (DISPOSABLE) IMPLANT
SPONGE GAUZE 4X4 12PLY (GAUZE/BANDAGES/DRESSINGS) ×2 IMPLANT
SPONGE LAP 4X18 X RAY DECT (DISPOSABLE) IMPLANT
STAPLER VISISTAT 35W (STAPLE) ×2 IMPLANT
SUCTION FRAZIER TIP 10 FR DISP (SUCTIONS) ×2 IMPLANT
SUT FIBERWIRE #2 38 REV NDL BL (SUTURE) ×6
SUT VIC AB 1 CTX 36 (SUTURE) ×4
SUT VIC AB 1 CTX36XBRD ANBCTR (SUTURE) ×4 IMPLANT
SUT VIC AB 2-0 CT1 27 (SUTURE)
SUT VIC AB 2-0 CT1 TAPERPNT 27 (SUTURE) IMPLANT
SUT VIC AB 2-0 SH 27 (SUTURE) ×4
SUT VIC AB 2-0 SH 27XBRD (SUTURE) ×2 IMPLANT
SUT VIC AB 3-0 SH 27 (SUTURE)
SUT VIC AB 3-0 SH 27X BRD (SUTURE) IMPLANT
SUTURE FIBERWR#2 38 REV NDL BL (SUTURE) ×3 IMPLANT
SYR 30ML SLIP (SYRINGE) ×2 IMPLANT
SYR CONTROL 10ML LL (SYRINGE) ×2 IMPLANT
TOWEL OR 17X24 6PK STRL BLUE (TOWEL DISPOSABLE) ×2 IMPLANT
TOWEL OR 17X26 10 PK STRL BLUE (TOWEL DISPOSABLE) ×2 IMPLANT
TOWER CARTRIDGE SMART MIX (DISPOSABLE) IMPLANT
TRAY FOLEY CATH 14FR (SET/KITS/TRAYS/PACK) ×2 IMPLANT
WATER STERILE IRR 1000ML POUR (IV SOLUTION) ×5 IMPLANT

## 2011-09-24 NOTE — Anesthesia Preprocedure Evaluation (Addendum)
Anesthesia Evaluation  Patient identified by MRN, date of birth, ID band Patient awake    Reviewed: Allergy & Precautions, H&P , NPO status , Patient's Chart, lab work & pertinent test results  Airway Mallampati: I TM Distance: >3 FB Neck ROM: Full    Dental  (+) Teeth Intact   Pulmonary          Cardiovascular hypertension, Pt. on medications Rhythm:regular Rate:Normal     Neuro/Psych    GI/Hepatic GERD-  Medicated,  Endo/Other  Diabetes mellitus-, Well Controlled, Type obesity  Renal/GU      Musculoskeletal   Abdominal   Peds  Hematology   Anesthesia Other Findings   Reproductive/Obstetrics                          Anesthesia Physical Anesthesia Plan  ASA: II  Anesthesia Plan: General ETT   Post-op Pain Management:    Induction: Intravenous  Airway Management Planned: Oral ETT  Additional Equipment:   Intra-op Plan:   Post-operative Plan: Extubation in OR  Informed Consent: I have reviewed the patients History and Physical, chart, labs and discussed the procedure including the risks, benefits and alternatives for the proposed anesthesia with the patient or authorized representative who has indicated his/her understanding and acceptance.     Plan Discussed with: Anesthesiologist, CRNA and Surgeon  Anesthesia Plan Comments:         Anesthesia Quick Evaluation

## 2011-09-24 NOTE — Progress Notes (Signed)
Portable right hip x-ray done as ordered

## 2011-09-24 NOTE — Anesthesia Postprocedure Evaluation (Signed)
  Anesthesia Post-op Note  Patient: Linwood Dibbles  Procedure(s) Performed: Procedure(s) (LRB): TOTAL HIP ARTHROPLASTY (Right)  Patient Location: PACU  Anesthesia Type: General  Level of Consciousness: awake, alert , oriented and patient cooperative  Airway and Oxygen Therapy: Patient Spontanous Breathing and Patient connected to nasal cannula oxygen  Post-op Pain: mild  Post-op Assessment: Post-op Vital signs reviewed, Patient's Cardiovascular Status Stable, Respiratory Function Stable, Patent Airway, No signs of Nausea or vomiting and Pain level controlled  Post-op Vital Signs: stable  Complications: No apparent anesthesia complications

## 2011-09-24 NOTE — Interval H&P Note (Signed)
History and Physical Interval Note:  09/24/2011 8:20 AM  Jessica [name]  has presented today for surgery, with the diagnosis of DJD RIGHT HIP  The various methods of treatment have been discussed with the patient and family. After consideration of risks, benefits and other options for treatment, the patient has consented to  Procedure(s) (LRB): TOTAL HIP ARTHROPLASTY (Right) as a surgical intervention .  The patients' history has been reviewed, patient examined, no change in status, stable for surgery.  I have reviewed the patients' chart and labs.  Questions were answered to the patient's satisfaction.     Darshana Curnutt F

## 2011-09-24 NOTE — Transfer of Care (Signed)
Immediate Anesthesia Transfer of Care Note  Patient: Jessica [name]  Procedure(s) Performed: Procedure(s) (LRB): TOTAL HIP ARTHROPLASTY (Right)  Patient Location: PACU  Anesthesia Type: General  Level of Consciousness: awake, alert  and oriented  Airway & Oxygen Therapy: Patient Spontanous Breathing and Patient connected to nasal cannula oxygen  Post-op Assessment: Report given to PACU RN, Post -op Vital signs reviewed and stable and Patient moving all extremities  Post vital signs: Reviewed and stable  Complications: No apparent anesthesia complications

## 2011-09-24 NOTE — Progress Notes (Signed)
Report given to maria rn as caregiver 

## 2011-09-24 NOTE — Preoperative (Signed)
Beta Blockers   Reason not to administer Beta Blockers:Not Applicable 

## 2011-09-24 NOTE — Progress Notes (Signed)
ANTICOAGULATION CONSULT NOTE - Initial Consult  Pharmacy Consult for Coumadin Indication: VTE prophylaxis  No Known Allergies  Patient Measurements: Wt. = 81 kg  Vital Signs: Temp: 98.1 F (36.7 C) (03/27 1315) Temp src: Oral (03/27 0744) BP: 140/59 mmHg (03/27 1317) Pulse Rate: 84  (03/27 1317)  Labs: No results found for this basename: HGB:2,HCT:3,PLT:3,APTT:3,LABPROT:3,INR:3,HEPARINUNFRC:3,CREATININE:3,CKTOTAL:3,CKMB:3,TROPONINI:3 in the last 72 hours CrCl is unknown because there is no height on file for the current visit.  Medical History: Past Medical History  Diagnosis Date  . Lymphocytic colitis   . Pain, lumbar region     partially herniated disc, s/p steroid injection 2007  . Cystocele   . Low bone mass 02/2008  . Type II or unspecified type diabetes mellitus without mention of complication, not stated as uncontrolled 08/2010    Diet controlled  . Essential hypertension, benign     takes Amlodipine and Prinizide daily  . Hyperlipidemia     takes Pravastatin daily  . GERD (gastroesophageal reflux disease)     takes Nexium daily  . Hay fever   . Osteoarthritis     hip, knee; severe  . Back pain     herniated disc  . Constipation   . Colon polyps   . Urinary frequency   . Incontinence     occasionally  . Urinary urgency   . Cataracts, bilateral   . Glaucoma   . Impaired hearing     Medications:  Prescriptions prior to admission  Medication Sig Dispense Refill  . amLODipine (NORVASC) 5 MG tablet Take 5 mg by mouth every morning.      Marland Kitchen esomeprazole (NEXIUM) 40 MG capsule Take 40 mg by mouth daily before supper.      Marland Kitchen lisinopril-hydrochlorothiazide (PRINZIDE,ZESTORETIC) 20-12.5 MG per tablet Take 2 tablets by mouth every morning.      . Multiple Vitamin (MULITIVITAMIN WITH MINERALS) TABS Take 1 tablet by mouth daily.      . pravastatin (PRAVACHOL) 20 MG tablet Take 20 mg by mouth every evening.        Assessment: 78 year old beginning Coumadin for  VTE prophylaxis s/p hip  Goal of Therapy:  INR 2-3   Plan:  1) Coumadin 4 mg po daily at 1800 2) Daily INR 3) Coumadin book, video  Okey Regal, PharmD Clinical Pharmacist [phone] 09/24/2011,2:01 PM

## 2011-09-24 NOTE — Brief Op Note (Signed)
09/24/2011  1:03 PM  PATIENT:  Jessica [name]  78 y.o. female  PRE-OPERATIVE DIAGNOSIS:  degenerative joint disease  RIGHT HIP  POST-OPERATIVE DIAGNOSIS:  degenerative joint disease right hip  PROCEDURE:  Procedure(s) (LRB): TOTAL HIP ARTHROPLASTY (Right)  SURGEON:  Surgeon(s) and Role:    * Loreta Ave, MD - Primary  PHYSICIAN ASSISTANT: Zonia Kief M   ANESTHESIA:   general  EBL:  Total I/O In: 1600 [I.V.:1600] Out: 400 [Urine:300; Blood:100]  SPECIMEN:  No Specimen  DISPOSITION OF SPECIMEN:  N/A  COUNTS:  YES  TOURNIQUET:  * No tourniquets in log *  PATIENT DISPOSITION:  PACU - hemodynamically stable.

## 2011-09-24 NOTE — Anesthesia Procedure Notes (Signed)
Procedure Name: Intubation Date/Time: 09/24/2011 10:10 AM Performed by: Rossie Muskrat L Pre-anesthesia Checklist: Patient identified, Timeout performed, Emergency Drugs available, Suction available and Patient being monitored Patient Re-evaluated:Patient Re-evaluated prior to inductionOxygen Delivery Method: Circle system utilized Preoxygenation: Pre-oxygenation with 100% oxygen Intubation Type: IV induction Ventilation: Mask ventilation without difficulty Laryngoscope Size: Miller and 2 Grade View: Grade III Tube type: Oral Tube size: 7.5 mm Number of attempts: 1 Airway Equipment and Method: Stylet Placement Confirmation: ETT inserted through vocal cords under direct vision and breath sounds checked- equal and bilateral Secured at: 21 cm Tube secured with: Tape Dental Injury: Teeth and Oropharynx as per pre-operative assessment

## 2011-09-25 ENCOUNTER — Encounter (HOSPITAL_COMMUNITY): Payer: Self-pay | Admitting: Orthopedic Surgery

## 2011-09-25 ENCOUNTER — Ambulatory Visit: Payer: Medicare Other | Admitting: Cardiology

## 2011-09-25 LAB — CBC
MCH: 30.7 pg (ref 26.0–34.0)
MCHC: 33.6 g/dL (ref 30.0–36.0)
MCV: 91.4 fL (ref 78.0–100.0)
Platelets: 239 10*3/uL (ref 150–400)
RBC: 3.36 MIL/uL — ABNORMAL LOW (ref 3.87–5.11)
RDW: 14.1 % (ref 11.5–15.5)

## 2011-09-25 LAB — BASIC METABOLIC PANEL
CO2: 26 mEq/L (ref 19–32)
Calcium: 8.6 mg/dL (ref 8.4–10.5)
Creatinine, Ser: 0.64 mg/dL (ref 0.50–1.10)
GFR calc Af Amer: >90 mL/min (ref >90)
GFR calc non Af Amer: 83 mL/min — ABNORMAL LOW (ref >90)
Sodium: 132 mEq/L — ABNORMAL LOW (ref 135–145)

## 2011-09-25 LAB — PROTIME-INR: Prothrombin Time: 15.5 seconds — ABNORMAL HIGH (ref 11.6–15.2)

## 2011-09-25 MED ORDER — ESOMEPRAZOLE MAGNESIUM 40 MG PO CPDR
40.0000 mg | DELAYED_RELEASE_CAPSULE | Freq: Every day | ORAL | Status: DC
  Administered 2011-09-25 – 2011-09-26 (×2): 40 mg via ORAL
  Filled 2011-09-25 (×3): qty 1

## 2011-09-25 MED ORDER — CEFAZOLIN SODIUM 1-5 GM-% IV SOLN
1.0000 g | Freq: Once | INTRAVENOUS | Status: AC
  Administered 2011-09-25: 1 g via INTRAVENOUS
  Filled 2011-09-25: qty 50

## 2011-09-25 MED ORDER — ALUM & MAG HYDROXIDE-SIMETH 200-200-20 MG/5ML PO SUSP
30.0000 mL | ORAL | Status: DC | PRN
  Administered 2011-09-25 (×2): 30 mL via ORAL
  Filled 2011-09-25 (×2): qty 30

## 2011-09-25 NOTE — Progress Notes (Signed)
UR COMPLETED  

## 2011-09-25 NOTE — Progress Notes (Signed)
Subjective: Doing well.  C/o of some epigastric burning. No chest pain, sob.     Objective: Vital signs in last 24 hours: Temp:  [97.2 F (36.2 C)-98.3 F (36.8 C)] 98.3 F (36.8 C) (03/28 0546) Pulse Rate:  [78-89] 83  (03/28 0546) Resp:  [10-23] 18  (03/28 0546) BP: (123-165)/(49-68) 128/58 mmHg (03/28 0546) SpO2:  [96 %-100 %] 98 % (03/28 0546)  Intake/Output from previous day: 03/27 0701 - 03/28 0700 In: 2720 [P.O.:120; I.V.:2600] Out: 1650 [Urine:1550; Blood:100] Intake/Output this shift:     Basename 09/25/11 0530  HGB 10.3*    Basename 09/25/11 0530  WBC 9.4  RBC 3.36*  HCT 30.7*  PLT 239    Basename 09/25/11 0530  NA 132*  K 3.7  CL 97  CO2 26  BUN 12  CREATININE 0.64  GLUCOSE 150*  CALCIUM 8.6    Basename 09/25/11 0530  LABPT --  INR 1.20    Exam:  Dressing c/d/i.  Calf nt, nvi.  Assessment/Plan: Needs snf placement for rehab.  Patient has been out to [facility] and wants to go there.  Transfer Friday if bed available.  D/c dilaudid.  Patient has hx gerd.  Will monitor.  Has PPI ordered.     Lore Polka M 09/25/2011, 10:14 AM

## 2011-09-25 NOTE — Evaluation (Signed)
Occupational Therapy Evaluation Patient Details Name: Jessica [name] MRN: [id number] DOB: 07-06-1929 Today's Date: 09/25/2011  Problem List:  Patient Active Problem List  Diagnoses  . COLONIC POLYPS, ADENOMATOUS  . HYPERLIPIDEMIA  . HYPERTENSION  . COLITIS  . GERD (gastroesophageal reflux disease)  . Pain, lumbar region  . Cystocele  . Low bone mass  . Type II or unspecified type diabetes mellitus without mention of complication, not stated as uncontrolled  . Osteoarthritis  . Preop cardiovascular exam    Past Medical History:  Past Medical History  Diagnosis Date  . Lymphocytic colitis   . Pain, lumbar region     partially herniated disc, s/p steroid injection 2007  . Cystocele   . Low bone mass 02/2008  . Type II or unspecified type diabetes mellitus without mention of complication, not stated as uncontrolled 08/2010    Diet controlled  . Essential hypertension, benign     takes Amlodipine and Prinizide daily  . Hyperlipidemia     takes Pravastatin daily  . GERD (gastroesophageal reflux disease)     takes Nexium daily  . Hay fever   . Osteoarthritis     hip, knee; severe  . Back pain     herniated disc  . Constipation   . Colon polyps   . Urinary frequency   . Incontinence     occasionally  . Urinary urgency   . Cataracts, bilateral   . Glaucoma   . Impaired hearing    Past Surgical History:  Past Surgical History  Procedure Date  . Toe surgery     bilateral  . Colonoscopy   . Tonsillectomy and adenoidectomy   . Total hip arthroplasty 09/24/2011    Procedure: TOTAL HIP ARTHROPLASTY;  Surgeon: Loreta Ave, MD;  Location: [facility] OR;  Service: Orthopedics;  Laterality: Right;    OT Assessment/Plan/Recommendation OT Assessment Clinical Impression Statement: Pt. presents s/p THA presenting with R LE TWB and with below problem list. Pt. will benefit from skilled OT to increase functional indepndence and get pt. to supervision level at D/C. OT  Recommendation/Assessment: Patient will need skilled OT in the acute care venue OT Problem List: Decreased strength;Decreased activity tolerance;Impaired balance (sitting and/or standing);Decreased safety awareness;Decreased knowledge of use of DME or AE;Pain OT Therapy Diagnosis : Generalized weakness;Acute pain OT Plan OT Frequency: Min 1X/week OT Treatment/Interventions: Self-care/ADL training;DME and/or AE instruction;Therapeutic activities;Patient/family education;Balance training OT Recommendation Follow Up Recommendations: Skilled nursing facility Equipment Recommended: Defer to next venue Individuals Consulted Consulted and Agree with Results and Recommendations: Patient OT Goals Acute Rehab OT Goals OT Goal Formulation: With patient Time For Goal Achievement: 2 weeks ADL Goals Pt Will Perform Grooming: with set-up;with supervision;Standing at sink ADL Goal: Grooming - Progress: Goal set today Pt Will Perform Lower Body Bathing: with set-up;with supervision;Sit to stand from chair;with adaptive equipment ADL Goal: Lower Body Bathing - Progress: Goal set today Pt Will Perform Lower Body Dressing: with set-up;with supervision;Sit to stand from chair;with adaptive equipment ADL Goal: Lower Body Dressing - Progress: Goal set today Pt Will Transfer to Toilet: with set-up;with supervision;with DME;Ambulation;3-in-1 ADL Goal: Toilet Transfer - Progress: Goal set today Additional ADL Goal #1: Pt. will recall 3 hip precautions ADL Goal: Additional Goal #1 - Progress: Goal set today  OT Evaluation Precautions/Restrictions  Precautions Precautions: Posterior Hip Precaution Booklet Issued: Yes (comment) Required Braces or Orthoses: No Restrictions Weight Bearing Restrictions: Yes RLE Weight Bearing: Touchdown weight bearing Prior Functioning Home Living Lives With: Daughter Type of Home:  House Home Layout: One level Home Access: Stairs to enter Entrance Stairs-Rails: Can reach  both Entrance Stairs-Number of Steps: 2 Bathroom Shower/Tub: Health visitor: Standard Home Adaptive Equipment: Straight cane Additional Comments: pt reports "I'm going to [employer]." Prior Function Level of Independence: Requires assistive device for independence;Independent with basic ADLs (used cane prior to sx) Able to Take Stairs?: Yes Driving: Yes Vocation: Retired ADL ADL Eating/Feeding: Simulated;Independent Where Assessed - Eating/Feeding: Chair Grooming: Performed;Wash/dry face;Set up;Minimal assistance Where Assessed - Grooming: Standing at sink Upper Body Bathing: Simulated;Chest;Right arm;Left arm;Abdomen;Set up Where Assessed - Upper Body Bathing: Sitting, chair Lower Body Bathing: Simulated;Maximal assistance Where Assessed - Lower Body Bathing: Sit to stand from chair Upper Body Dressing: Performed;Minimal assistance Upper Body Dressing Details (indicate cue type and reason): with donning gown Where Assessed - Upper Body Dressing: Sitting, chair Lower Body Dressing: Simulated;Maximal assistance Where Assessed - Lower Body Dressing: Sit to stand from chair Toilet Transfer: Performed;+2 Total assistance;Comment for patient % (pt=70%) Toilet Transfer Details (indicate cue type and reason): Max verbal cues to maintain weightbearing precautions touchdown Rt. LE and for technique of pivoting on left foot  Toilet Transfer Method: Stand pivot Toilet Transfer Equipment: Bedside commode Toileting - Clothing Manipulation: Performed;Maximal assistance Toileting - Clothing Manipulation Details (indicate cue type and reason): With moving gown and pulling up/down undergarments Where Assessed - Toileting Clothing Manipulation: Sit to stand from 3-in-1 or toilet Toileting - Hygiene: Performed;Minimal assistance Toileting - Hygiene Details (indicate cue type and reason): Assist to maintain upright position and for bil UE weightbearing on RW Where Assessed -  Toileting Hygiene: Standing Equipment Used: Rolling walker ADL Comments: Pt. educated on techniques for LB ADLs to increase independence     Cognition Cognition Arousal/Alertness: Awake/alert Overall Cognitive Status: Appears within functional limits for tasks assessed Orientation Level: Oriented X4 Sensation/Coordination Sensation Light Touch:  (numbness from R knee to ankle, had prior to sx) Extremity Assessment RUE Assessment RUE Assessment: Within Functional Limits LUE Assessment LUE Assessment: Within Functional Limits Mobility  Bed Mobility Bed Mobility: Yes Supine to Sit: 3: Mod assist;With rails;HOB flat Supine to Sit Details (indicate cue type and reason): Assist provided for Rt. LE scooting EOB Transfers Transfers: Yes Sit to Stand: 3: Mod assist;From bed;From chair/3-in-1;With upper extremity assist Sit to Stand Details (indicate cue type and reason): Mod verbal cues for Rt LE TWB and bil UE support through RW  Stand to Sit: 4: Min assist;To chair/3-in-1 Stand to Sit Details: v/c's for hand placement and R LE management   End of Session OT - End of Session Equipment Utilized During Treatment: Gait belt Activity Tolerance: Patient tolerated treatment well Patient left: in chair;with call bell in reach Nurse Communication: Mobility status for transfers General Behavior During Session: [facility] for tasks performed Cognition: [facility] for tasks performed   Tiphani Mells, OTR/L Pager [phone] 09/25/2011, 1:16 PM

## 2011-09-25 NOTE — Progress Notes (Signed)
Physical Therapy Evaluation Patient Details Name: Jessica [name] MRN: [id number] DOB: July 07, 1929 Today's Date: 09/25/2011  Problem List:  Patient Active Problem List  Diagnoses  . COLONIC POLYPS, ADENOMATOUS  . HYPERLIPIDEMIA  . HYPERTENSION  . COLITIS  . GERD (gastroesophageal reflux disease)  . Pain, lumbar region  . Cystocele  . Low bone mass  . Type II or unspecified type diabetes mellitus without mention of complication, not stated as uncontrolled  . Osteoarthritis  . Preop cardiovascular exam    Past Medical History:  Past Medical History  Diagnosis Date  . Lymphocytic colitis   . Pain, lumbar region     partially herniated disc, s/p steroid injection 2007  . Cystocele   . Low bone mass 02/2008  . Type II or unspecified type diabetes mellitus without mention of complication, not stated as uncontrolled 08/2010    Diet controlled  . Essential hypertension, benign     takes Amlodipine and Prinizide daily  . Hyperlipidemia     takes Pravastatin daily  . GERD (gastroesophageal reflux disease)     takes Nexium daily  . Hay fever   . Osteoarthritis     hip, knee; severe  . Back pain     herniated disc  . Constipation   . Colon polyps   . Urinary frequency   . Incontinence     occasionally  . Urinary urgency   . Cataracts, bilateral   . Glaucoma   . Impaired hearing    Past Surgical History:  Past Surgical History  Procedure Date  . Toe surgery     bilateral  . Colonoscopy   . Tonsillectomy and adenoidectomy   . Total hip arthroplasty 09/24/2011    Procedure: TOTAL HIP ARTHROPLASTY;  Surgeon: Loreta Ave, MD;  Location: [facility] OR;  Service: Orthopedics;  Laterality: Right;    PT Assessment/Plan/Recommendation PT Assessment Clinical Impression Statement: Patient s/p R THA presenting with R LE TWB and R post hip prec. Patient currently requires significant increased assist for all mobility and ADLs. Patient to benefit from SNF upon d/c to maximize  functional recovery for safe transition back home. Patient has daughter who lives with her but is unable to provide assist/support. PT Recommendation/Assessment: Patient will need skilled PT in the acute care venue PT Problem List: Decreased strength;Decreased range of motion;Decreased activity tolerance;Decreased balance;Decreased mobility;Pain Barriers to Discharge: Decreased caregiver support PT Therapy Diagnosis : Difficulty walking;Abnormality of gait;Generalized weakness PT Plan PT Frequency: 7X/week PT Treatment/Interventions: DME instruction;Gait training;Stair training;Functional mobility training;Therapeutic activities;Therapeutic exercise;Balance training PT Recommendation Recommendations for Other Services:  (social work consult) Follow Up Recommendations: Skilled nursing facility;Supervision/Assistance - 24 hour Equipment Recommended: Defer to next venue PT Goals  Acute Rehab PT Goals PT Goal Formulation: With patient Time For Goal Achievement: 7 days Pt will go Supine/Side to Sit: with supervision;with HOB 0 degrees PT Goal: Supine/Side to Sit - Progress: Goal set today Pt will go Sit to Stand: with supervision (while adhereing to R LE TWB.) PT Goal: Sit to Stand - Progress: Goal set today Pt will Transfer Bed to Chair/Chair to Bed: with supervision (with RW and R LE TWB.) PT Transfer Goal: Bed to Chair/Chair to Bed - Progress: Goal set today Pt will Ambulate: 16 - 50 feet;with supervision;with rolling walker (with R LE TWB) PT Goal: Ambulate - Progress: Goal set today Additional Goals Additional Goal #1: Patient 100% compliant with R LE TWB and post hip prec. PT Goal: Additional Goal #1 - Progress: Goal set today  PT Evaluation Precautions/Restrictions  Precautions Precautions: Posterior Hip Precaution Booklet Issued: Yes (comment) Required Braces or Orthoses: No Restrictions Weight Bearing Restrictions: Yes RLE Weight Bearing: Touchdown weight bearing Prior  Functioning  Home Living Lives With: Daughter Type of Home: House Home Layout: One level Home Access: Stairs to enter Entrance Stairs-Rails: Can reach both Entrance Stairs-Number of Steps: 2 Bathroom Shower/Tub: Health visitor: Standard Home Adaptive Equipment: Straight cane Additional Comments: pt reports "I'm going to [employer]." Prior Function Level of Independence: Requires assistive device for independence;Independent with basic ADLs (used cane prior to sx) Able to Take Stairs?: Yes Driving: Yes Vocation: Retired Financial risk analyst Arousal/Alertness: Awake/alert Overall Cognitive Status: Appears within functional limits for tasks assessed Orientation Level: Oriented X4 Sensation/Coordination Sensation Light Touch:  (numbness from R knee to ankle, had prior to sx) Extremity Assessment RUE Assessment RUE Assessment: Within Functional Limits LUE Assessment LUE Assessment: Within Functional Limits RLE Assessment RLE Assessment:  (pt able to completed LAQ but unable to hold R LE up to maintain R LE TWB LLE Assessment LLE Assessment: Within Functional Limits Mobility (including Balance) Bed Mobility Bed Mobility: Yes Supine to Sit: 3: Mod assist;With rails;HOB flat Supine to Sit Details (indicate cue type and reason): Assist provided for Rt. LE scooting EOB Transfers Transfers: Yes Sit to Stand: 3: Mod assist;From bed;From chair/3-in-1;With upper extremity assist Sit to Stand Details (indicate cue type and reason): v/c's to maintain R LE TWB, v/c's for walker safety Stand to Sit: 4: Min assist;To chair/3-in-1 Stand to Sit Details: v/c's for hand placement and R LE management Stand Pivot Transfers: 2: Max assist Stand Pivot Transfer Details (indicate cue type and reason): max directional verbal cues, assist for R LE due to TWB only, assist for walker management Ambulation/Gait Ambulation/Gait: No (pt unable to "hop" on L LE to maintain R TWB) Stairs:  No Wheelchair Mobility Wheelchair Mobility: No    Exercise  Total Joint Exercises Ankle Circles/Pumps: AROM;10 reps;Supine;Both Quad Sets: AROM;Right;10 reps;Supine Gluteal Sets: AROM;Both;10 reps;Supine (5 sec hold) End of Session PT - End of Session Equipment Utilized During Treatment: Gait belt Activity Tolerance:  (limited by inability to follow R TWB) Patient left: in chair;with call bell in reach Nurse Communication: Mobility status for transfers;Mobility status for ambulation;Weight bearing status (TWB marked on board in room) General Behavior During Session: [facility] for tasks performed Cognition:  (difficulty following precautions)  Pain: 8/10, RN provided patient with pain medication  Lewis Shock, PT, DPT Pager #: [phone] Office #: [phone]

## 2011-09-25 NOTE — Op Note (Signed)
NAME:  Jessica [name], Jessica [name] NO.:  [id number]  MEDICAL RECORD NO.:  [id number]  LOCATION:  5016                         FACILITY:  [facility]  PHYSICIAN:  Loreta Ave, M.D. DATE OF BIRTH:  05-20-1930  DATE OF PROCEDURE:  09/24/2011 DATE OF DISCHARGE:                              OPERATIVE REPORT   PREOPERATIVE DIAGNOSIS:  End-stage degenerative arthritis, right hip.  POSTOPERATIVE DIAGNOSIS:  End-stage degenerative arthritis, right hip.  PROCEDURE:  Right total hip replacement utilizing Stryker Secur-Fit prosthesis.  A Press-Fit 48-mm acetabular component screw fixation x2. A 32-mm internal diameter liner 10-degree overhang.  A Press-Fit #7 femoral component with 127-degree neck angle.  A 32-mm +0 metallic head.  SURGEON:  Loreta Ave, MD  ASSISTANT:  Genene Churn. Barry Dienes, PA present throughout the entire case and necessary for timely completion of procedure.  ANESTHESIA:  General.  BLOOD LOSS:  200 mL.  BLOOD GIVEN:  None.  SPECIMENS:  None.  CULTURES:  None.  COMPLICATIONS:  None.  DRESSINGS:  Soft compressive with abduction pillow.  DESCRIPTION OF PROCEDURE:  The patient was brought to the operating room, placed on the operating room table in supine position.  After adequate anesthesia had been obtained, turned to lateral position right side up, prepped and draped in usual sterile fashion.  Incision along the shaft of femur extending posterosuperior.  Skin and subcutaneous tissue divided.  Hemostasis with cautery.  Iliotibial band exposed, incised.  Charnley retractor put in place.  Neurovascular structures identified, protected.  External rotator capsule taken down off the back of the intertrochanteric groove, tied with FiberWire.  Hip dislocated posteriorly.  Femoral neck cut 1 fingerbreadth above the lesser trochanter.  Acetabulum exposed.  Grade 4 change throughout.  Remnants of labrum debrided and spurs cleared out.  The acetabulum was brought  up to good bleeding bone, sufficient medial and inferior placement.  Fitted with a 48-mm component at 45 degrees of abduction, 20 degrees of anteversion.  Good capturing and fixation augmented with 2 screws through the cup.  A 32-mm internal diameter liner with the 10-degree overhang placed posterosuperior.  Attention was turned to the femur. The canal was opened.  Distal reamers, proximal broaches brought up to good sizing proximally distally.  Eventually, fitted with a #7 component.  After appropriate trials, a 32-mm head +0 was attached. With this construct, the hip was reduced.  Equal leg lengths.  Excellent stability in flexion and extension confirmed.  Wound thoroughly irrigated.  External rotator capsule repaired to the back of the intertrochanteric groove through drill holes.  Sutures tied over bony bridge.  Charnley retractor removed.  Iliotibial band closed with 1 Vicryl.  Skin and subcutaneous tissue with Vicryl staples.  Margins were injected with Marcaine.  Sterile compressive dressing applied.  Returned to supine position.  Abduction pillow placed.  Anesthesia reversed. Brought to the recovery room.  Tolerated surgery well. No complications.     Loreta Ave, M.D.     DFM/MEDQ  D:  09/24/2011  T:  09/25/2011  Job:  [id number]

## 2011-09-25 NOTE — Progress Notes (Signed)
ANTICOAGULATION CONSULT NOTE - Initial Consult  Pharmacy Consult for Coumadin Indication: VTE prophylaxis  No Known Allergies  Patient Measurements: Wt. = 81 kg  Vital Signs: Temp: 98.3 F (36.8 C) (03/28 0546) BP: 128/58 mmHg (03/28 0546) Pulse Rate: 83  (03/28 0546)  Labs:  Basename 09/25/11 0530  HGB 10.3*  HCT 30.7*  PLT 239  APTT --  LABPROT 15.5*  INR 1.20  HEPARINUNFRC --  CREATININE 0.64  CKTOTAL --  CKMB --  TROPONINI --   CrCl is unknown because there is no height on file for the current visit.  Medical History: Past Medical History  Diagnosis Date  . Lymphocytic colitis   . Pain, lumbar region     partially herniated disc, s/p steroid injection 2007  . Cystocele   . Low bone mass 02/2008  . Type II or unspecified type diabetes mellitus without mention of complication, not stated as uncontrolled 08/2010    Diet controlled  . Essential hypertension, benign     takes Amlodipine and Prinizide daily  . Hyperlipidemia     takes Pravastatin daily  . GERD (gastroesophageal reflux disease)     takes Nexium daily  . Hay fever   . Osteoarthritis     hip, knee; severe  . Back pain     herniated disc  . Constipation   . Colon polyps   . Urinary frequency   . Incontinence     occasionally  . Urinary urgency   . Cataracts, bilateral   . Glaucoma   . Impaired hearing     Medications:  Prescriptions prior to admission  Medication Sig Dispense Refill  . amLODipine (NORVASC) 5 MG tablet Take 5 mg by mouth every morning.      Marland Kitchen esomeprazole (NEXIUM) 40 MG capsule Take 40 mg by mouth daily before supper.      Marland Kitchen lisinopril-hydrochlorothiazide (PRINZIDE,ZESTORETIC) 20-12.5 MG per tablet Take 2 tablets by mouth every morning.      . Multiple Vitamin (MULITIVITAMIN WITH MINERALS) TABS Take 1 tablet by mouth daily.      . pravastatin (PRAVACHOL) 20 MG tablet Take 20 mg by mouth every evening.        Assessment: 78 year old beginning Coumadin for VTE  prophylaxis s/p hip  Goal of Therapy:  INR 2-3   Plan:  1) Coumadin 4 mg po daily at 1800 2) Daily INR 3) Coumadin education  Talbert Cage, PharmD Clinical Pharmacist [phone] 09/25/2011,9:41 AM

## 2011-09-26 DIAGNOSIS — M549 Dorsalgia, unspecified: Secondary | ICD-10-CM | POA: Insufficient documentation

## 2011-09-26 DIAGNOSIS — H409 Unspecified glaucoma: Secondary | ICD-10-CM | POA: Insufficient documentation

## 2011-09-26 DIAGNOSIS — D62 Acute posthemorrhagic anemia: Secondary | ICD-10-CM

## 2011-09-26 DIAGNOSIS — M546 Pain in thoracic spine: Secondary | ICD-10-CM | POA: Insufficient documentation

## 2011-09-26 DIAGNOSIS — N3281 Overactive bladder: Secondary | ICD-10-CM | POA: Insufficient documentation

## 2011-09-26 DIAGNOSIS — K52832 Lymphocytic colitis: Secondary | ICD-10-CM | POA: Insufficient documentation

## 2011-09-26 DIAGNOSIS — K5900 Constipation, unspecified: Secondary | ICD-10-CM | POA: Insufficient documentation

## 2011-09-26 DIAGNOSIS — H9190 Unspecified hearing loss, unspecified ear: Secondary | ICD-10-CM | POA: Insufficient documentation

## 2011-09-26 DIAGNOSIS — H269 Unspecified cataract: Secondary | ICD-10-CM | POA: Insufficient documentation

## 2011-09-26 HISTORY — DX: Acute posthemorrhagic anemia: D62

## 2011-09-26 LAB — PROTIME-INR
INR: 1.46 (ref 0.00–1.49)
Prothrombin Time: 18 seconds — ABNORMAL HIGH (ref 11.6–15.2)

## 2011-09-26 LAB — CBC
Hemoglobin: 8.8 g/dL — ABNORMAL LOW (ref 12.0–15.0)
MCH: 29.6 pg (ref 26.0–34.0)
MCHC: 32.8 g/dL (ref 30.0–36.0)
RDW: 14.1 % (ref 11.5–15.5)

## 2011-09-26 MED ORDER — SENNOSIDES-DOCUSATE SODIUM 8.6-50 MG PO TABS: 1.0000 | ORAL_TABLET | Freq: Every evening | ORAL | Status: DC | PRN

## 2011-09-26 MED ORDER — HYDROCODONE-ACETAMINOPHEN 10-325 MG PO TABS: 1.0000 | ORAL_TABLET | ORAL | Status: AC | PRN

## 2011-09-26 MED ORDER — WARFARIN SODIUM 4 MG PO TABS: 4.0000 mg | ORAL_TABLET | Freq: Every day | ORAL | Status: DC

## 2011-09-26 NOTE — Progress Notes (Signed)
Clinical Social Work Department BRIEF PSYCHOSOCIAL ASSESSMENT 09/26/2011  Patient:  Jessica [name], Jessica [name]     Account Number:  [id number]     Admit date:  09/24/2011  Clinical Social Worker:  Peggyann Shoals  Date/Time:  09/26/2011 09:30 AM  Referred by:  Physician  Date Referred:  09/25/2011 Referred for  SNF Placement   Other Referral:   Interview type:  Patient Other interview type:    PSYCHOSOCIAL DATA Living Status:  WITH ADULT CHILDREN Admitted from facility:   Level of care:   Primary support name:  Derrek Gu Primary support relationship to patient:  CHILD, ADULT Degree of support available:   Supportive, [phone].  Son, Clovis Fredrickson  Daughter in [place], [phone]    CURRENT CONCERNS Current Concerns  Post-Acute Placement   Other Concerns:    SOCIAL WORK ASSESSMENT / PLAN CSW met with pt to address consult. CSW explained role of clinical social work. Pt lives with her adult daughter, who is supportive, however is unable to provide the needed care when pt is discharged. Pt shared that she would like to go to [facility] [place] at discharge. Pt has already completed the admissions paperwork.   Assessment/plan status:  Other - See comment Other assessment/ plan:   CSW completed FL2 and sent request to [facility] [place]. CSW also contacted Admissions Coordinator, as pt has completed admission paperwork prior to surgery.    CSW will continue to follow to facilitate discharge to SNF.   Information/referral to community resources:   As needed.    PATIENT'S/FAMILY'S RESPONSE TO PLAN OF CARE: Pt was alert and oriented. Pt was very pleasant and agreeable to discharge plan.    Dede Query, MSW, Theresia Majors [phone]

## 2011-09-26 NOTE — Progress Notes (Addendum)
Pt is ready for dishcarge today to [facility] in [place]. Facility is ready to accept pt. [facility] will provide transportation to facility. Pt is agreeable to discharge plan. CSW will send discharge summary to facility. CSW is signing off as no further clinical social work needs identified.   Dede Query, MSW, Theresia Majors [phone]  Addendum: For clarity, pt's insurance does not require a 3 night qualifying stay. This has been clarified with facility.  Dede Query, MSW, Theresia Majors

## 2011-09-26 NOTE — Progress Notes (Addendum)
Clinical Social Work Department CLINICAL SOCIAL WORK PLACEMENT NOTE 09/26/2011  Patient:  Jessica [name], Jessica [name]  Account Number:  [id number] Admit date:  09/24/2011  Clinical Social Worker:  Peggyann Shoals  Date/time:  09/26/2011 10:00 AM  Clinical Social Work is seeking post-discharge placement for this patient at the following level of care:   SKILLED NURSING   (*CSW will update this form in Epic as items are completed)   09/26/2011  Patient/family provided with [facility] Department of Clinical Social Work's list of facilities offering this level of care within the geographic area requested by the patient (or if unable, by the patient's family).  09/26/2011  Patient/family informed of their freedom to choose among providers that offer the needed level of care, that participate in Medicare, Medicaid or managed care program needed by the patient, have an available bed and are willing to accept the patient.  09/26/2011  Patient/family informed of [facility]' ownership interest in [facility], as well as of the fact that they are under no obligation to receive care at this facility.  PASARR submitted to EDS on 09/25/2011 PASARR number received from EDS on 09/26/11  FL2 transmitted to all facilities in geographic area requested by pt/family on  09/26/2011 FL2 transmitted to all facilities within larger geographic area on   Patient informed that his/her managed care company has contracts with or will negotiate with  certain facilities, including the following:   Yes     Patient/family informed of bed offers received:  08/2911 Patient chooses bed at [facility] in [place].  Physician recommends and patient chooses bed at  [facility] in [place]  Patient to be transferred to [facility] in [place] on   Patient to be transferred to facility by [facility]  The following physician request were entered in Epic:   Additional  Comments: PASARR was submitted prior to assessment on 09/25/2011 as it is a holiday weekend. As of note, PASARR has not been recieved. PASARR has been received.   Dede Query, MSW, Theresia Majors [phone]

## 2011-09-26 NOTE — Progress Notes (Signed)
Physical Therapy Treatment Note   09/26/11 1210  PT Visit Information  Last PT Received On 06/08/12  Precautions  Precautions Posterior Hip  Precaution Booklet Issued Yes (comment)  Precaution Comments pt able to recall all precautions  Restrictions  RLE Weight Bearing TWB (per patient report, Dr. Eulah Pont said " I could put as much weight as I wanted to through my right leg" attempted to page both Fayrene Fearing, [place] and Dr. Eulah Pont multiple times but was unable to reach either person. con't to instruct pt on R LE TWB.  Bed Mobility  Bed Mobility (pt received sitting up in chair)  Transfers  Sit to Stand 4: Min assist  Sit to Stand Details (indicate cue type and reason) v/c's to maintain TWB and hand placement  Stand to Sit 4: Min assist  Stand to Sit Details v/c's to reach back and for R LE management  Ambulation/Gait  Ambulation/Gait Yes  Ambulation/Gait Assistance 4: Min assist  Ambulation/Gait Assistance Details (indicate cue type and reason) patient reports "I'm putting about 50% of my weight through my right leg." despite instruction for TWB  Ambulation Distance (Feet) 10 Feet (x2, to/from bathroom)  Assistive device Rolling walker  Gait Pattern Step-to pattern;Decreased step length - right;Decreased stance time - right  Gait velocity slow  Stairs No  Wheelchair Mobility  Wheelchair Mobility No  Total Joint Exercises  Quad Sets AROM;Right;10 reps;Supine  Gluteal Sets AROM;Both;10 reps;Supine  Hip ABduction/ADduction AROM;Right;10 reps;Standing  Straight Leg Raises AROM;Right;10 reps;Seated (with LEs elevated)  Long Arc Quad AROM;Right;10 reps;Seated  Marching in Standing AROM;Right;10 reps;Standing  Standing Hip Extension AROM;Right;10 reps;Standing  PT - End of Session  Equipment Utilized During Treatment Gait belt  Activity Tolerance Patient tolerated treatment well  Patient left in chair;with call bell in reach  General  Behavior During Session [facility] for tasks performed    Cognition [facility] for tasks performed  PT - Assessment/Plan  Comments on Treatment Session Patient non-compliant with R LE TWB however patient reports MD to have told her she could bear as much weight as she could tolerate. patient with much improved ambulation ability with putting 50% of her weight through R LE. Patient unable to maintain   PT Plan Discharge plan remains appropriate;Frequency remains appropriate  PT Frequency 7X/week  Follow Up Recommendations Skilled nursing facility  Equipment Recommended Defer to next venue  Acute Rehab PT Goals  PT Goal: Supine/Side to Sit - Progress Progressing toward goal  PT Goal: Sit to Stand - Progress Progressing toward goal  PT Transfer Goal: Bed to Chair/Chair to Bed - Progress Progressing toward goal  PT Goal: Ambulate - Progress Progressing toward goal  Additional Goals  PT Goal: Additional Goal #1 - Progress Progressing toward goal    Pain: pt c/o R knee pain over R hip. Pt unable to rate.  Lewis Shock, PT, DPT Pager #: [phone] Office #: [phone]

## 2011-09-26 NOTE — Progress Notes (Signed)
Patient ID: Jessica [name], female   DOB: 05-Mar-1930, 78 y.o.   MRN: [id number]   Priority Discharge Summary # [phone]

## 2011-09-26 NOTE — Discharge Summary (Signed)
NAME:  Jessica [name], Jessica [name] NO.:  [id number]  MEDICAL RECORD NO.:  [id number]  LOCATION:  5016                         FACILITY:  [facility]  PHYSICIAN:  Loreta Ave, M.D. DATE OF BIRTH:  06-23-1930  DATE OF ADMISSION:  09/24/2011 DATE OF DISCHARGE:                              DISCHARGE SUMMARY   FINAL DIAGNOSES: 1. 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 2. Hypertension. 3. Gastroesophageal reflux disease. 4. Hypercholesterolemia.  HISTORY:  A 78 year old white female with history of end-stage DJD right hip and chronic pain, presented to our office for preop evaluation for total hip replacement.  She had progressively worsening pain with no response with conservative treatment.  Significant decrease in her daily activities due to the ongoing complaint.  We received appropriate preop medical clearance.  HOSPITAL COURSE:  On September 24, 2011, the patient was taken to the [facility] OR and a right total hip replacement procedure was performed. Surgeon, Mckinley Jewel, MD, and assistant, [facility].  Anesthesia, general.  Minimal blood loss.  No specimens or cultures.  Sponge counts correct.  The patient tolerated the procedure well, was transferred to recovery in stable condition.  On September 25, 2011, the patient was doing well, but complained of some epigastric burning.  No specific chest pain or shortness of breath.  The patient does have a history of acid reflux. The patient lives alone will be needing skilled nursing facility placement at [facility] at her request.  Temp 98.3, pulse 83, respirations 18, blood pressure 120/58.  Right hip dressing clean, dry, intact.  Calf nontender.  Neurovascular intact.  Hemoglobin 10.3, hematocrit 30.7. Sodium 132.  Discontinued Dilaudid.  We will order patient's home Nexium.  Maalox p.r.n.  Pharmacy protocol, Coumadin, Lovenox started for DVT prophylaxis.  PT/OT consults.  On September 26, 2011, the patient doing well.  WBC 10.6, hemoglobin 8.8, hematocrit 26.8.  Ready for transfer to a skilled facility when bed available.  CONDITION:  Stable.  DISPOSITION:  Transfer to [facility] for rehab.  DISCHARGE MEDICATIONS: 1. Norvasc 5 mg 1 tablet p.o. q.a.m. 2. Nexium 40 mg 1 tablet p.o. daily. 3. Lisinopril/HCTZ 20/12.5 mg 1 tablet p.o. q.a.m. 4. Pravastatin 20 mg 1 tablet p.o. q.a.m. 5. Norco 10/325 one to two tablets p.o. q.4-6 h. p.r.n. for pain. 6. Robaxin 500 mg 1 tablet p.o. q.6 h. p.r.n. for spasms. 7. Coumadin pharmacy protocol.  Pharmacist to dose and maintain INR 2-     3 x4 weeks postop for DVT prophylaxis. 8. Lovenox 40 mg 1 subcu injection daily.  Discontinue Lovenox when     Coumadin is therapeutic with INR 2-3. 9. Colace 100 mg 1 tablet p.o. b.i.d. with food.  HOME INSTRUCTIONS:  While at [facility], the patient continue PT/OT for total hip protocol.  She is touchdown weightbearing right lower extremity with a walker.  If she begins to have any difficulty with touchdown weightbearing restrictions, call our office immediately to see if this can be changed.  Okay to shower, but no tub soaking.  Do not apply any creams or ointments to her incision.  Daily dressing changes with  4 x 4 gauze and tape.  The patient will follow up in our office in 2 weeks when she is 2 weeks' postop for recheck and hip staples will be removed at that time.  Therapist will go over hip precautions with the patient.  We can be reached at area code [phone] if there are any questions or concerns.     Genene Churn. Denton Meek.   ______________________________ Loreta Ave, M.D.    JMO/MEDQ  D:  09/26/2011  T:  09/26/2011  Job:  [id number]

## 2011-09-26 NOTE — Discharge Summary (Signed)
Physician Discharge Summary  Patient ID: Jessica [name] MRN: [id number] DOB/AGE: [birth year] 78 y.o.  Admit date: 09/24/2011 Discharge date: 09/26/2011  Admission Diagnoses: Past Medical History  Diagnosis Date  . Lymphocytic colitis   . Pain, lumbar region     partially herniated disc, s/p steroid injection 2007  . Cystocele   . Low bone mass 02/2008  . Type II or unspecified type diabetes mellitus without mention of complication, not stated as uncontrolled 08/2010    Diet controlled  . Essential hypertension, benign     takes Amlodipine and Prinizide daily  . Hyperlipidemia     takes Pravastatin daily  . GERD (gastroesophageal reflux disease)     takes Nexium daily  . Hay fever   . Osteoarthritis     hip, knee; severe  . Back pain     herniated disc  . Constipation   . Colon polyps   . Urinary frequency   . Incontinence     occasionally  . Urinary urgency   . Cataracts, bilateral   . Glaucoma   . Impaired hearing    Discharge Diagnoses:  Active Problems:  HYPERTENSION  GERD (gastroesophageal reflux disease)  Osteoarthritis  Postoperative anemia due to acute blood loss   Discharged Condition: good  Hospital Course: 09/24/2011   Right total hip replacement by Dr Eulah Pont.  Posterior approach. Coumadin started. Post op day one patient did well with physical therapy.  Metabolically stable and mildly anemic.  Post op day 2 patient is alert and oriented. She ambulated well with physical therapy.  Surgical wound is well approximated.  2 small skin tears covered with Mepelex boarder.  Mild drainage.  Hgb is 8.8.  She is asymptomatic.  Stable for discharge to SNF  Consults: None  Significant Diagnostic Studies:  Results for orders placed during the hospital encounter of 09/24/11 (from the past 72 hour(s))  GLUCOSE, CAPILLARY     Status: Abnormal   Collection Time   09/24/11  7:56 AM      Component Value Range Comment   Glucose-Capillary 112 (*) 70 - 99 (mg/dL)     GLUCOSE, CAPILLARY     Status: Abnormal   Collection Time   09/24/11 12:29 PM      Component Value Range Comment   Glucose-Capillary 112 (*) 70 - 99 (mg/dL)   PROTIME-INR     Status: Abnormal   Collection Time   09/25/11  5:30 AM      Component Value Range Comment   Prothrombin Time 15.5 (*) 11.6 - 15.2 (seconds)    INR 1.20  0.00 - 1.49    CBC     Status: Abnormal   Collection Time   09/25/11  5:30 AM      Component Value Range Comment   WBC 9.4  4.0 - 10.5 (K/uL)    RBC 3.36 (*) 3.87 - 5.11 (MIL/uL)    Hemoglobin 10.3 (*) 12.0 - 15.0 (g/dL)    HCT 40.9 (*) 81.1 - 46.0 (%)    MCV 91.4  78.0 - 100.0 (fL)    MCH 30.7  26.0 - 34.0 (pg)    MCHC 33.6  30.0 - 36.0 (g/dL)    RDW 91.4  78.2 - 95.6 (%)    Platelets 239  150 - 400 (K/uL)   BASIC METABOLIC PANEL     Status: Abnormal   Collection Time   09/25/11  5:30 AM      Component Value Range Comment   Sodium 132 (*) 135 -  145 (mEq/L)    Potassium 3.7  3.5 - 5.1 (mEq/L)    Chloride 97  96 - 112 (mEq/L)    CO2 26  19 - 32 (mEq/L)    Glucose, Bld 150 (*) 70 - 99 (mg/dL)    BUN 12  6 - 23 (mg/dL)    Creatinine, Ser 0.45  0.50 - 1.10 (mg/dL)    Calcium 8.6  8.4 - 10.5 (mg/dL)    GFR calc non Af Amer 83 (*) >90 (mL/min)    GFR calc Af Amer >90  >90 (mL/min)   PROTIME-INR     Status: Abnormal   Collection Time   09/26/11  6:10 AM      Component Value Range Comment   Prothrombin Time 18.0 (*) 11.6 - 15.2 (seconds)    INR 1.46  0.00 - 1.49    CBC     Status: Abnormal   Collection Time   09/26/11  6:10 AM      Component Value Range Comment   WBC 10.6 (*) 4.0 - 10.5 (K/uL)    RBC 2.97 (*) 3.87 - 5.11 (MIL/uL)    Hemoglobin 8.8 (*) 12.0 - 15.0 (g/dL)    HCT 40.9 (*) 81.1 - 46.0 (%)    MCV 90.2  78.0 - 100.0 (fL)    MCH 29.6  26.0 - 34.0 (pg)    MCHC 32.8  30.0 - 36.0 (g/dL)    RDW 91.4  78.2 - 95.6 (%)    Platelets 208  150 - 400 (K/uL)     Treatments: IV hydration, antibiotics: Ancef, anticoagulation: LMW heparin and warfarin,  therapies: PT, OT, RN and SW and surgery: right total hip replacement  Discharge Exam: Blood pressure 133/47, pulse 103, temperature 98.8 F (37.1 C), temperature source Tympanic, resp. rate 16, SpO2 96.00%. General appearance: alert, cooperative and appears stated age Resp: clear to auscultation bilaterally GI: soft, non-tender; bowel sounds normal; no masses,  no organomegaly Extremities: extremities normal, atraumatic, no cyanosis or edema Pulses: 2+ and symmetric Neurologic: Grossly normal Incision/Wound: well approximated.  2 small skin tears covered with mepelex boarder  Disposition: Stable  Discharge Orders    Future Orders Please Complete By Expires   Diet - low sodium heart healthy      Diet - low sodium heart healthy      Call MD / Call 911      Comments:   If you experience chest pain or shortness of breath, CALL 911 and be transported to the hospital emergency room.  If you develope a fever above 101 F, pus (white drainage) or increased drainage or redness at the wound, or calf pain, call your surgeon's office.   Constipation Prevention      Comments:   Drink plenty of fluids.  Prune juice may be helpful.  You may use a stool softener, such as Colace (over the counter) 100 mg twice a day.  Use MiraLax (over the counter) for constipation as needed.   Increase activity slowly as tolerated      Discharge instructions      Comments:   Ok to shower, but no tub soaking.  Do not apply any creams or ointments to incision.  Continue physical therapy protocol.  Strict hip precautions.  Touchdown weightbearing right leg.   Weight Bearing as taught in Physical Therapy      Comments:   Use a walker or crutches as instructed.   Driving restrictions      Comments:   No driving  until further notice.   Lifting restrictions      Comments:   No lifting until further notice.   Follow the hip precautions as taught in Physical Therapy      Change dressing      Comments:   You may  change your dressing daily with sterile 4 x 4 inch gauze dressing and paper tape.       TED hose      Comments:   Use stockings (TED hose) for 3-4 weeks on both leg(s).  You may remove them at night for sleeping.    Call MD / Call 911      Comments:   If you experience chest pain or shortness of breath, CALL 911 and be transported to the hospital emergency room.  If you develope a fever above 101 F, pus (white drainage) or increased drainage or redness at the wound, or calf pain, call your surgeon's office.   Constipation Prevention      Comments:   Drink plenty of fluids.  Prune juice may be helpful.  You may use a stool softener, such as Colace (over the counter) 100 mg twice a day.  Use MiraLax (over the counter) for constipation as needed.   Increase activity slowly as tolerated      Weight Bearing as taught in Physical Therapy      Comments:   Use a walker or crutches as instructed.   Follow the hip precautions as taught in Physical Therapy      Comments:   Posterior total hip precautions   Change dressing      Comments:   You may change the dressing daily with sterile 4 x 4 inch gauze dressing and paper tape.  You may clean the incision with alcohol prior to redressing   TED hose      Comments:   Use stockings (TED hose) for 2 weeks on both leg(s).  You may remove them at night for sleeping.     Medication List  As of 09/26/2011  2:00 PM   STOP taking these medications         mulitivitamin with minerals Tabs         TAKE these medications         amLODipine 5 MG tablet   Commonly known as: NORVASC   Take 5 mg by mouth every morning.      esomeprazole 40 MG capsule   Commonly known as: NEXIUM   Take 40 mg by mouth daily before supper.      HYDROcodone-acetaminophen 10-325 MG per tablet   Commonly known as: NORCO   Take 1-2 tablets by mouth every 4 (four) hours as needed.      lisinopril-hydrochlorothiazide 20-12.5 MG per tablet   Commonly known as:  PRINZIDE,ZESTORETIC   Take 2 tablets by mouth every morning.      pravastatin 20 MG tablet   Commonly known as: PRAVACHOL   Take 20 mg by mouth every evening.      senna-docusate 8.6-50 MG per tablet   Commonly known as: Senokot-S   Take 1 tablet by mouth at bedtime as needed.      warfarin 4 MG tablet   Commonly known as: COUMADIN   Take 1 tablet (4 mg total) by mouth daily at 6 PM.             SignedPascal Lux 09/26/2011, 2:00 PM

## 2012-01-02 ENCOUNTER — Other Ambulatory Visit: Payer: Self-pay | Admitting: Physician Assistant

## 2012-01-02 NOTE — Telephone Encounter (Signed)
Needs office visit before runs out 

## 2012-01-15 ENCOUNTER — Ambulatory Visit: Payer: Medicare Other | Admitting: Physician Assistant

## 2012-01-27 ENCOUNTER — Encounter: Payer: Self-pay | Admitting: Physician Assistant

## 2012-01-27 ENCOUNTER — Ambulatory Visit (INDEPENDENT_AMBULATORY_CARE_PROVIDER_SITE_OTHER): Payer: Medicare Other | Admitting: Physician Assistant

## 2012-01-27 VITALS — BP 138/70 | HR 74 | Temp 98.0°F | Resp 16 | Ht 63.75 in | Wt 172.8 lb

## 2012-01-27 DIAGNOSIS — E119 Type 2 diabetes mellitus without complications: Secondary | ICD-10-CM

## 2012-01-27 DIAGNOSIS — I10 Essential (primary) hypertension: Secondary | ICD-10-CM

## 2012-01-27 DIAGNOSIS — E785 Hyperlipidemia, unspecified: Secondary | ICD-10-CM

## 2012-01-27 DIAGNOSIS — L659 Nonscarring hair loss, unspecified: Secondary | ICD-10-CM

## 2012-01-27 DIAGNOSIS — K219 Gastro-esophageal reflux disease without esophagitis: Secondary | ICD-10-CM

## 2012-01-27 LAB — LIPID PANEL
Cholesterol: 170 mg/dL (ref 0–200)
LDL Cholesterol: 88 mg/dL (ref 0–99)
Total CHOL/HDL Ratio: 4 Ratio
Triglycerides: 194 mg/dL — ABNORMAL HIGH (ref <150)
VLDL: 39 mg/dL (ref 0–40)

## 2012-01-27 LAB — COMPREHENSIVE METABOLIC PANEL
AST: 19 U/L (ref 0–37)
Albumin: 4.6 g/dL (ref 3.5–5.2)
Alkaline Phosphatase: 69 U/L (ref 39–117)
Glucose, Bld: 93 mg/dL (ref 70–99)
Potassium: 4.4 mEq/L (ref 3.5–5.3)
Sodium: 139 mEq/L (ref 135–145)
Total Bilirubin: 0.4 mg/dL (ref 0.3–1.2)
Total Protein: 7.9 g/dL (ref 6.0–8.3)

## 2012-01-27 LAB — GLUCOSE, POCT (MANUAL RESULT ENTRY): POC Glucose: 91 mg/dl (ref 70–99)

## 2012-01-27 NOTE — Progress Notes (Signed)
Subjective:    Patient ID: Jessica [name], female    DOB: 1929-09-28, 78 y.o.   MRN: [id number]  HPI This 78 y.o. Female presents for re-evaluation of HTN and hyperlipidemia.  This is a re-scheduled appointment from March 2013 when she underwent knee surgery.  After a brief rehabilitation in a nursing facility, she is back home and independent.  She has been able to lose some weight and hopes that with healthy eating choices and regular exercise she'll be able to keep it off.  Review of Systems No chest pain, SOB, HA, dizziness, vision change, N/V, diarrhea, constipation, dysuria, urinary urgency or frequency, myalgias, arthralgias or rash.   Past Medical History  Diagnosis Date  . Lymphocytic colitis   . Pain, lumbar region     partially herniated disc, s/p steroid injection 2007  . Cystocele   . Low bone mass 02/2008  . Type II or unspecified type diabetes mellitus without mention of complication, not stated as uncontrolled 08/2010    Diet controlled  . Essential hypertension, benign     takes Amlodipine and Prinizide daily  . Hyperlipidemia     takes Pravastatin daily  . GERD (gastroesophageal reflux disease)     takes Nexium daily  . Hay fever   . Osteoarthritis     hip, knee; severe  . Back pain     herniated disc  . Constipation   . Colon polyps   . Urinary frequency   . Incontinence     occasionally  . Urinary urgency   . Cataracts, bilateral   . Glaucoma   . Impaired hearing     Past Surgical History  Procedure Date  . Toe surgery     bilateral  . Colonoscopy   . Tonsillectomy and adenoidectomy   . Total hip arthroplasty 09/24/2011    Procedure: TOTAL HIP ARTHROPLASTY;  Surgeon: Loreta Ave, MD;  Location: [facility] OR;  Service: Orthopedics;  Laterality: Right;   Prior to Admission medications   Medication Sig Start Date End Date Taking? Authorizing Provider  amLODipine (NORVASC) 5 MG tablet TAKE 1 TABLET BY MOUTH EVERY DAY 01/02/12  Yes Ryan M Dunn, PA-C    GLUCOSAMINE PO Take by mouth.   Yes Historical Provider, MD  lisinopril-hydrochlorothiazide (PRINZIDE,ZESTORETIC) 20-12.5 MG per tablet TAKE 2 TABLETS BY MOUTH EVERY MORNING 01/02/12  Yes Ryan M Dunn, PA-C  NEXIUM 40 MG capsule TAKE ONE CAPSULE BY MOUTH EVERY DAY 01/02/12  Yes Ryan M Dunn, PA-C  pravastatin (PRAVACHOL) 20 MG tablet Take 20 mg by mouth every evening.   Yes Historical Provider, MD    No Known Allergies  History   Social History  . Marital Status: Widowed   Occupational History  . Retired Print production planner    Social History Main Topics  . Smoking status: Former Smoker    Types: Cigarettes    Quit date: 07/07/1995  . Smokeless tobacco: Not on file  . Alcohol Use: No  . Drug Use: No  . Sexually Active: No   Family History  Problem Relation Age of Onset  . Stroke Mother 39  . Hypertension Mother   . Anesthesia problems Neg Hx   . Hypotension Neg Hx   . Pseudochol deficiency Neg Hx   . Malignant hyperthermia Neg Hx   . Heart disease Father 38    Endocarditis  . Rheumatic fever Father       Objective:   Physical Exam  Blood pressure 138/70, pulse 74, temperature 98 F (36.7  C), temperature source Oral, resp. rate 16, height 5' 3.75" (1.619 m), weight 172 lb 12.8 oz (78.382 kg), SpO2 97.00%. Body mass index is 29.89 kg/(m^2). Well-developed, well nourished WF who is awake, alert and oriented, in NAD. HEENT: [place]/AT, sclera and conjunctiva are clear.   Neck: supple, non-tender, no lymphadenopathy, thyromegaly. Heart: RRR, no murmur Lungs: CTA Extremities: no cyanosis, clubbing or edema. Skin: warm and dry without rash.  Results for orders placed in visit on 01/27/12  GLUCOSE, POCT (MANUAL RESULT ENTRY)      Component Value Range   POC Glucose 91  70 - 99 mg/dl  POCT GLYCOSYLATED HEMOGLOBIN (HGB A1C)      Component Value Range   Hemoglobin A1C 6.0        Assessment & Plan:   1. HTN (hypertension)  Comprehensive metabolic panel, continue current treatment   2. Type II or unspecified type diabetes mellitus without mention of complication, not stated as uncontrolled; diet-controlled.  POCT glucose (manual entry), POCT glycosylated hemoglobin (Hb A1C), Comprehensive metabolic panel, continue lifestyle modification  3. GERD (gastroesophageal reflux disease)  Continue Nexium  4. Hyperlipidemia LDL goal < 100  Comprehensive metabolic panel, Lipid panel  5. Hair loss, likely due to stress of surgery/rehab  TSH   RTC 6 months.

## 2012-01-27 NOTE — Patient Instructions (Addendum)
Please schedule a follow-up with Dr. Leone Payor at [facility] for a repeat colonoscopy, your last one was 02/16/2007. Please schedule your mammogram.[phone] Start taking 1-2 baby aspirin daily.

## 2012-01-28 ENCOUNTER — Encounter: Payer: Self-pay | Admitting: Physician Assistant

## 2012-02-06 ENCOUNTER — Other Ambulatory Visit: Payer: Self-pay | Admitting: Surgery

## 2012-04-02 ENCOUNTER — Other Ambulatory Visit: Payer: Self-pay | Admitting: Physician Assistant

## 2012-04-06 ENCOUNTER — Telehealth: Payer: Self-pay

## 2012-04-06 NOTE — Telephone Encounter (Signed)
Lisa from [facility] [address] called to verify that [facility] had received the refill request for 4 medications that were sent on Friday 04/02/12.  Belinda Fisher that we had received the requests and they were pending. Please review.  Phone number for [facility] [address] is [phone].

## 2012-04-07 NOTE — Telephone Encounter (Signed)
Rx's authorized via Rx Request

## 2012-06-24 ENCOUNTER — Telehealth: Payer: Self-pay

## 2012-06-24 NOTE — Telephone Encounter (Signed)
Pt has an appt scheduled with chelle on 08/10/12, but states will be out of all meds (4 of them she cant remember the names) [facility] [address]

## 2012-06-25 MED ORDER — PRAVASTATIN SODIUM 20 MG PO TABS: 20.0000 mg | ORAL_TABLET | Freq: Every evening | ORAL | Status: DC

## 2012-06-25 MED ORDER — ESOMEPRAZOLE MAGNESIUM 40 MG PO CPDR: 40.0000 mg | DELAYED_RELEASE_CAPSULE | Freq: Every day | ORAL | Status: DC

## 2012-06-25 MED ORDER — AMLODIPINE BESYLATE 5 MG PO TABS: 5.0000 mg | ORAL_TABLET | Freq: Every day | ORAL | Status: DC

## 2012-06-25 MED ORDER — LISINOPRIL-HYDROCHLOROTHIAZIDE 20-12.5 MG PO TABS: 1.0000 | ORAL_TABLET | Freq: Every day | ORAL | Status: DC

## 2012-06-25 NOTE — Telephone Encounter (Signed)
Called patient to advise meds sent in/ okay per [facility]. PA

## 2012-06-28 ENCOUNTER — Telehealth: Payer: Self-pay

## 2012-06-28 MED ORDER — LISINOPRIL-HYDROCHLOROTHIAZIDE 20-12.5 MG PO TABS: 2.0000 | ORAL_TABLET | ORAL | Status: DC

## 2012-06-28 NOTE — Telephone Encounter (Signed)
PT STATES SHE WENT TO P/U HER MEDICINE AND HER LISINOPRIL WASN'T WHAT SHE USUALLY TAKES. JUST WANTED TO CHECK AND MAKE SURE PLEASE CALL [phone]    [facility] [address]

## 2012-06-28 NOTE — Telephone Encounter (Signed)
Advised pt we would fix the rx and send in new rx for 2 tabs

## 2012-07-02 ENCOUNTER — Telehealth: Payer: Self-pay

## 2012-07-02 NOTE — Telephone Encounter (Signed)
Chelle has already corrected this. I have advised patient. She will contact pharmacy.

## 2012-07-02 NOTE — Telephone Encounter (Signed)
Pt says that we sent in her rx for lisinipril for taking it one time a day and it is supposed to be twice a day please call patient at [phone]

## 2012-08-10 ENCOUNTER — Ambulatory Visit: Payer: Medicare Other | Admitting: Physician Assistant

## 2012-09-07 ENCOUNTER — Encounter: Payer: Self-pay | Admitting: Physician Assistant

## 2012-09-07 ENCOUNTER — Ambulatory Visit (INDEPENDENT_AMBULATORY_CARE_PROVIDER_SITE_OTHER): Payer: Medicare Other | Admitting: Physician Assistant

## 2012-09-07 VITALS — BP 145/75 | HR 82 | Temp 97.9°F | Resp 18 | Ht 63.25 in | Wt 178.6 lb

## 2012-09-07 DIAGNOSIS — I10 Essential (primary) hypertension: Secondary | ICD-10-CM

## 2012-09-07 DIAGNOSIS — K219 Gastro-esophageal reflux disease without esophagitis: Secondary | ICD-10-CM

## 2012-09-07 DIAGNOSIS — M549 Dorsalgia, unspecified: Secondary | ICD-10-CM

## 2012-09-07 DIAGNOSIS — D649 Anemia, unspecified: Secondary | ICD-10-CM

## 2012-09-07 DIAGNOSIS — Z23 Encounter for immunization: Secondary | ICD-10-CM

## 2012-09-07 DIAGNOSIS — Z1159 Encounter for screening for other viral diseases: Secondary | ICD-10-CM

## 2012-09-07 DIAGNOSIS — E785 Hyperlipidemia, unspecified: Secondary | ICD-10-CM

## 2012-09-07 LAB — POCT GLYCOSYLATED HEMOGLOBIN (HGB A1C): Hemoglobin A1C: 5.7

## 2012-09-07 LAB — GLUCOSE, POCT (MANUAL RESULT ENTRY): POC Glucose: 113 mg/dl — AB (ref 70–99)

## 2012-09-07 MED ORDER — HYDROCODONE-ACETAMINOPHEN 5-325 MG PO TABS: 1.0000 | ORAL_TABLET | Freq: Four times a day (QID) | ORAL | Status: DC | PRN

## 2012-09-07 MED ORDER — PRAVASTATIN SODIUM 20 MG PO TABS: 20.0000 mg | ORAL_TABLET | Freq: Every evening | ORAL | Status: DC

## 2012-09-07 MED ORDER — LISINOPRIL-HYDROCHLOROTHIAZIDE 20-12.5 MG PO TABS: 2.0000 | ORAL_TABLET | ORAL | Status: DC

## 2012-09-07 MED ORDER — AMLODIPINE BESYLATE 5 MG PO TABS: 5.0000 mg | ORAL_TABLET | Freq: Every day | ORAL | Status: DC

## 2012-09-07 MED ORDER — ESOMEPRAZOLE MAGNESIUM 40 MG PO CPDR: 40.0000 mg | DELAYED_RELEASE_CAPSULE | Freq: Every day | ORAL | Status: DC

## 2012-09-07 NOTE — Progress Notes (Signed)
Subjective:    Patient ID: Jessica [name], female    DOB: 1929/10/03, 79 y.o.   MRN: [id number]  HPI This 79 y.o. female presents for evaluation of HTN, hyperlipidemia, DM type 2, pain from osteoarthritis. Increasing arthritis pain.  Needing NSAIDS more often. Otherwise she feels well.  Her diagnosis of DM was made last year due to 2+ fasting glucose readings above 100, but A1C has not measured >6.  She doesn't check her glucose at home.   Past Medical History  Diagnosis Date  . Lymphocytic colitis   . Pain, lumbar region     partially herniated disc, s/p steroid injection 2007  . Cystocele   . Low bone mass 02/2008  . Type II or unspecified type diabetes mellitus without mention of complication, not stated as uncontrolled 08/2010    Diet controlled  . Essential hypertension, benign     takes Amlodipine and Prinizide daily  . Hyperlipidemia     takes Pravastatin daily  . GERD (gastroesophageal reflux disease)     takes Nexium daily  . Hay fever   . Osteoarthritis     hip, knee; severe  . Back pain     herniated disc  . Constipation   . Colon polyps   . Urinary frequency   . Incontinence     occasionally  . Urinary urgency   . Cataracts, bilateral   . Glaucoma(365)   . Impaired hearing     Past Surgical History  Procedure Laterality Date  . Toe surgery      bilateral  . Colonoscopy    . Tonsillectomy and adenoidectomy    . Total hip arthroplasty  09/24/2011    Procedure: TOTAL HIP ARTHROPLASTY;  Surgeon: Loreta Ave, MD;  Location: [facility] OR;  Service: Orthopedics;  Laterality: Right;    Prior to Admission medications   Medication Sig Start Date End Date Taking? Authorizing Provider  amLODipine (NORVASC) 5 MG tablet Take 1 tablet (5 mg total) by mouth daily. 06/25/12  Yes Lucendia Leard S Orene Abbasi, PA-C  Biotin 1 MG CAPS Take by mouth.   Yes Historical Provider, MD  esomeprazole (NEXIUM) 40 MG capsule Take 1 capsule (40 mg total) by mouth daily before breakfast. 06/25/12   Yes Gladis Soley S Laynie Espy, PA-C  GLUCOSAMINE PO Take by mouth.   Yes Historical Provider, MD  lisinopril-hydrochlorothiazide (PRINZIDE,ZESTORETIC) 20-12.5 MG per tablet Take 2 tablets by mouth every morning. 06/28/12  Yes Huie Ghuman S Hatsue Sime, PA-C  Multiple Vitamins-Minerals (MULTIVITAMIN WITH MINERALS) tablet Take 1 tablet by mouth daily.   Yes Historical Provider, MD  pravastatin (PRAVACHOL) 20 MG tablet Take 1 tablet (20 mg total) by mouth every evening. 06/25/12  Yes Caley Volkert S Kaston Faughn, PA-C    No Known Allergies  History   Social History  . Marital Status: Widowed    Spouse Name: N/A    Number of Children: N/A  . Years of Education: N/A   Occupational History  . Retired Print production planner    Social History Main Topics  . Smoking status: Former Smoker    Types: Cigarettes    Quit date: 07/07/1995  . Smokeless tobacco: Not on file  . Alcohol Use: No  . Drug Use: No  . Sexually Active: No   Other Topics Concern  . Not on file   Social History Narrative   Daughter lives with patient.    Family History  Problem Relation Age of Onset  . Stroke Mother 84  . Hypertension Mother   . Anesthesia problems  Neg Hx   . Hypotension Neg Hx   . Pseudochol deficiency Neg Hx   . Malignant hyperthermia Neg Hx   . Heart disease Father 65    Endocarditis  . Rheumatic fever Father     Review of Systems No chest pain, SOB, HA, dizziness, vision change, N/V, diarrhea, constipation, dysuria, urinary urgency or frequency, myalgias, arthralgias or rash.     Objective:   Physical Exam Blood pressure 145/75, pulse 82, temperature 97.9 F (36.6 C), temperature source Oral, resp. rate 18, height 5' 3.25" (1.607 m), weight 178 lb 9.6 oz (81.012 kg), SpO2 96.00%. Body mass index is 31.37 kg/(m^2). Well-developed, well nourished WF who is awake, alert and oriented, in NAD. HEENT: [place]/AT, PERRL, EOMI.  Sclera and conjunctiva are clear.  EAC are patent, TMs are normal in appearance. Nasal mucosa is pink  and moist. OP is clear. Neck: supple, non-tender, no lymphadenopathy, thyromegaly. Heart: RRR, no murmur Lungs: normal effort, CTA Abdomen: normo-active bowel sounds, supple, non-tender, no mass or organomegaly. Extremities: no cyanosis, clubbing or edema. Mild crepitus in the knees, R>L. Skin: warm and dry without rash. Psychologic: good mood and appropriate affect, normal speech and behavior.  Results for orders placed in visit on 09/07/12  GLUCOSE, POCT (MANUAL RESULT ENTRY)      Result Value Range   POC Glucose 113 (*) 70 - 99 mg/dl  POCT GLYCOSYLATED HEMOGLOBIN (HGB A1C)      Result Value Range   Hemoglobin A1C 5.7        Assessment & Plan:  Need for immunization against influenza - Plan: Flu vaccine greater than or equal to 3yo preservative free IM  HYPERLIPIDEMIA - Plan: Lipid panel, pravastatin (PRAVACHOL) 20 MG tablet  HYPERTENSION - Plan: Comprehensive metabolic panel, amLODipine (NORVASC) 5 MG tablet, lisinopril-hydrochlorothiazide (PRINZIDE,ZESTORETIC) 20-12.5 MG per tablet  Type II or unspecified type diabetes mellitus without mention of complication, not stated as uncontrolled - Plan: Microalbumin, urine, POCT glucose (manual entry), POCT glycosylated hemoglobin (Hb A1C)  Osteoarthritis - Plan: HYDROcodone-acetaminophen (NORCO) 5-325 MG per tablet  Back pain - Plan: HYDROcodone-acetaminophen (NORCO) 5-325 MG per tablet  Anemia - Plan: CBC with Differential  Need for hepatitis C screening test - Plan: Hepatitis C antibody  Osteoporosis, unspecified - Plan: DG Bone Density  GERD (gastroesophageal reflux disease) - Plan: esomeprazole (NEXIUM) 40 MG capsule  RTC 6 months, sooner if needed. Fernande Bras, PA-C Certified Physician Assistant [facility]

## 2012-09-08 LAB — LIPID PANEL
HDL: 56 mg/dL (ref >39)
Triglycerides: 204 mg/dL — ABNORMAL HIGH (ref <150)

## 2012-09-08 LAB — CBC WITH DIFFERENTIAL/PLATELET
HCT: 38.2 % (ref 36.0–46.0)
Hemoglobin: 12.5 g/dL (ref 12.0–15.0)
Lymphocytes Relative: 37 % (ref 12–46)
Lymphs Abs: 3.4 10*3/uL (ref 0.7–4.0)
MCHC: 32.7 g/dL (ref 30.0–36.0)
Monocytes Absolute: 0.5 10*3/uL (ref 0.1–1.0)
Monocytes Relative: 5 % (ref 3–12)
Neutro Abs: 5.2 10*3/uL (ref 1.7–7.7)

## 2012-09-08 LAB — COMPREHENSIVE METABOLIC PANEL
Albumin: 5.1 g/dL (ref 3.5–5.2)
BUN: 27 mg/dL — ABNORMAL HIGH (ref 6–23)
CO2: 23 mEq/L (ref 19–32)
Calcium: 10.2 mg/dL (ref 8.4–10.5)
Chloride: 103 mEq/L (ref 96–112)
Glucose, Bld: 110 mg/dL — ABNORMAL HIGH (ref 70–99)
Potassium: 4.5 mEq/L (ref 3.5–5.3)

## 2012-09-09 ENCOUNTER — Ambulatory Visit: Payer: Medicare Other | Admitting: Physician Assistant

## 2012-09-10 ENCOUNTER — Encounter: Payer: Self-pay | Admitting: Physician Assistant

## 2012-09-19 ENCOUNTER — Other Ambulatory Visit: Payer: Self-pay | Admitting: Physician Assistant

## 2012-12-23 IMAGING — CR DG CHEST 2V
2 series · 2 of 2 positions shown · non-contrast
Comparison: None.

CLINICAL DATA: Preop

CHEST - 2 VIEW

[view not recorded (1 of 2)]
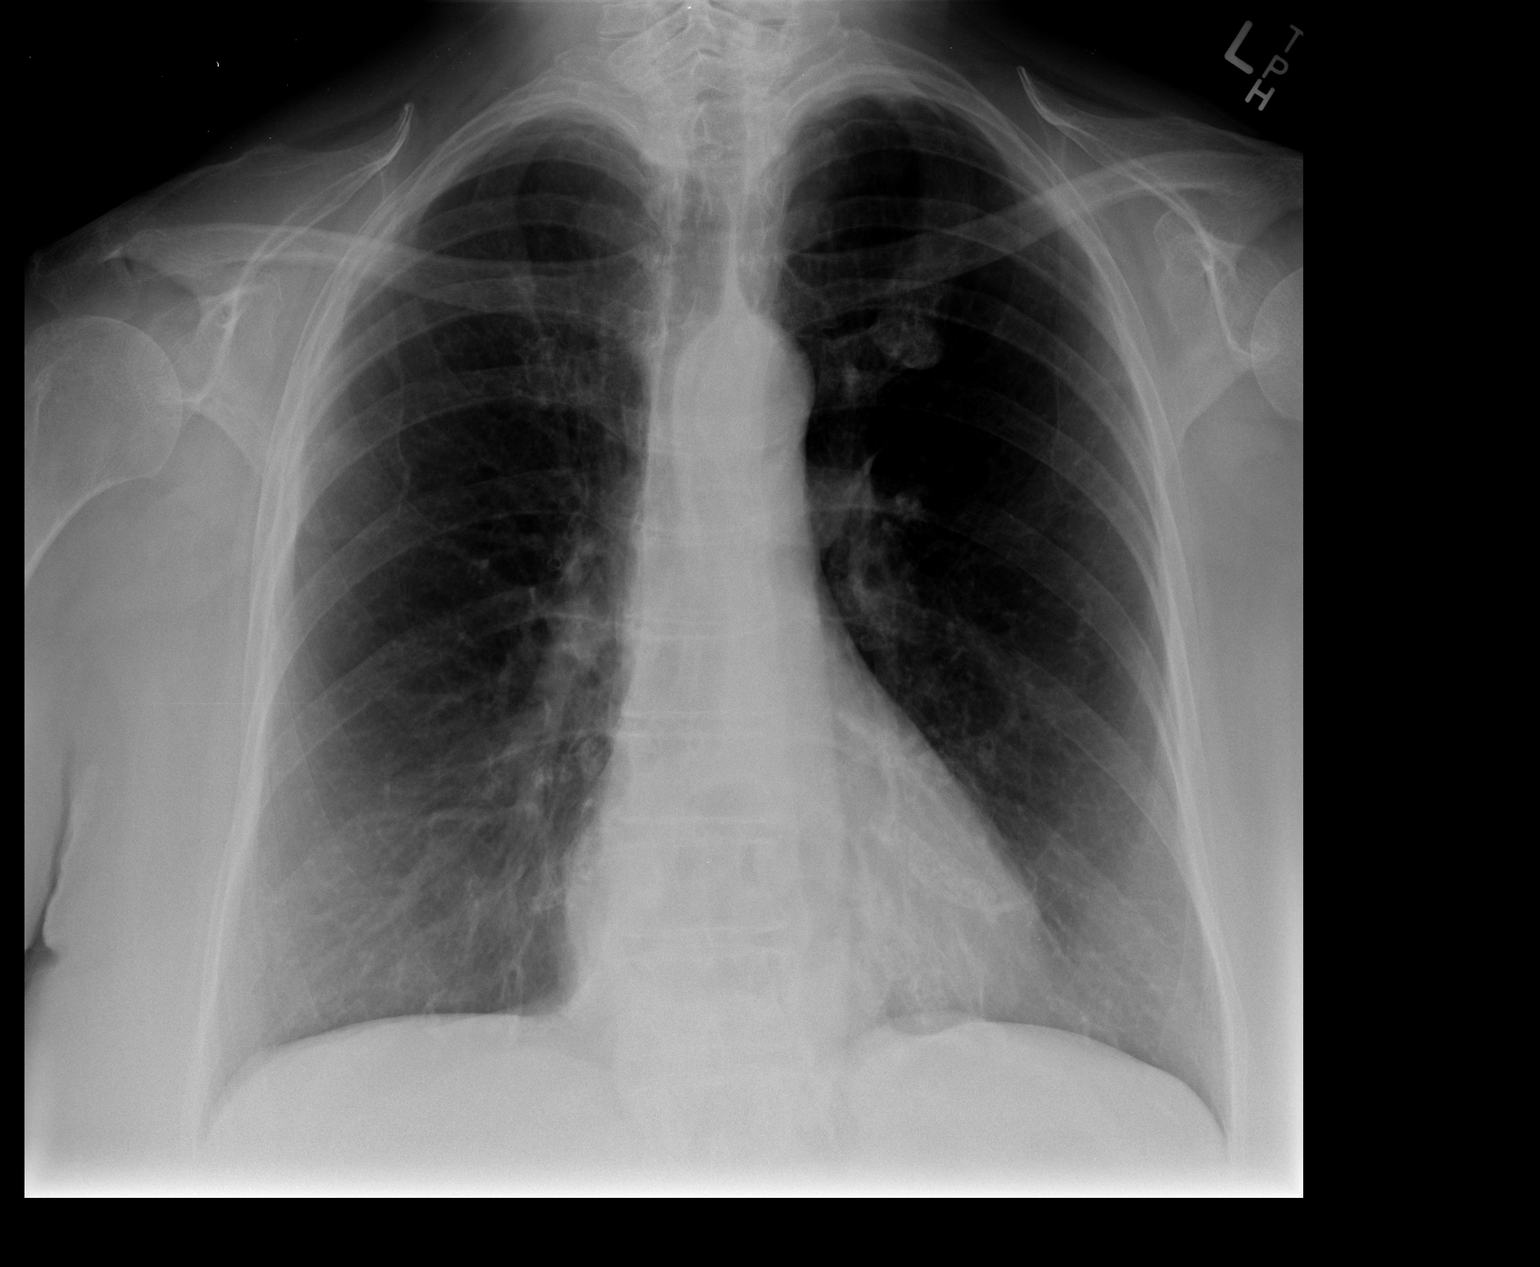

[view not recorded (2 of 2)]
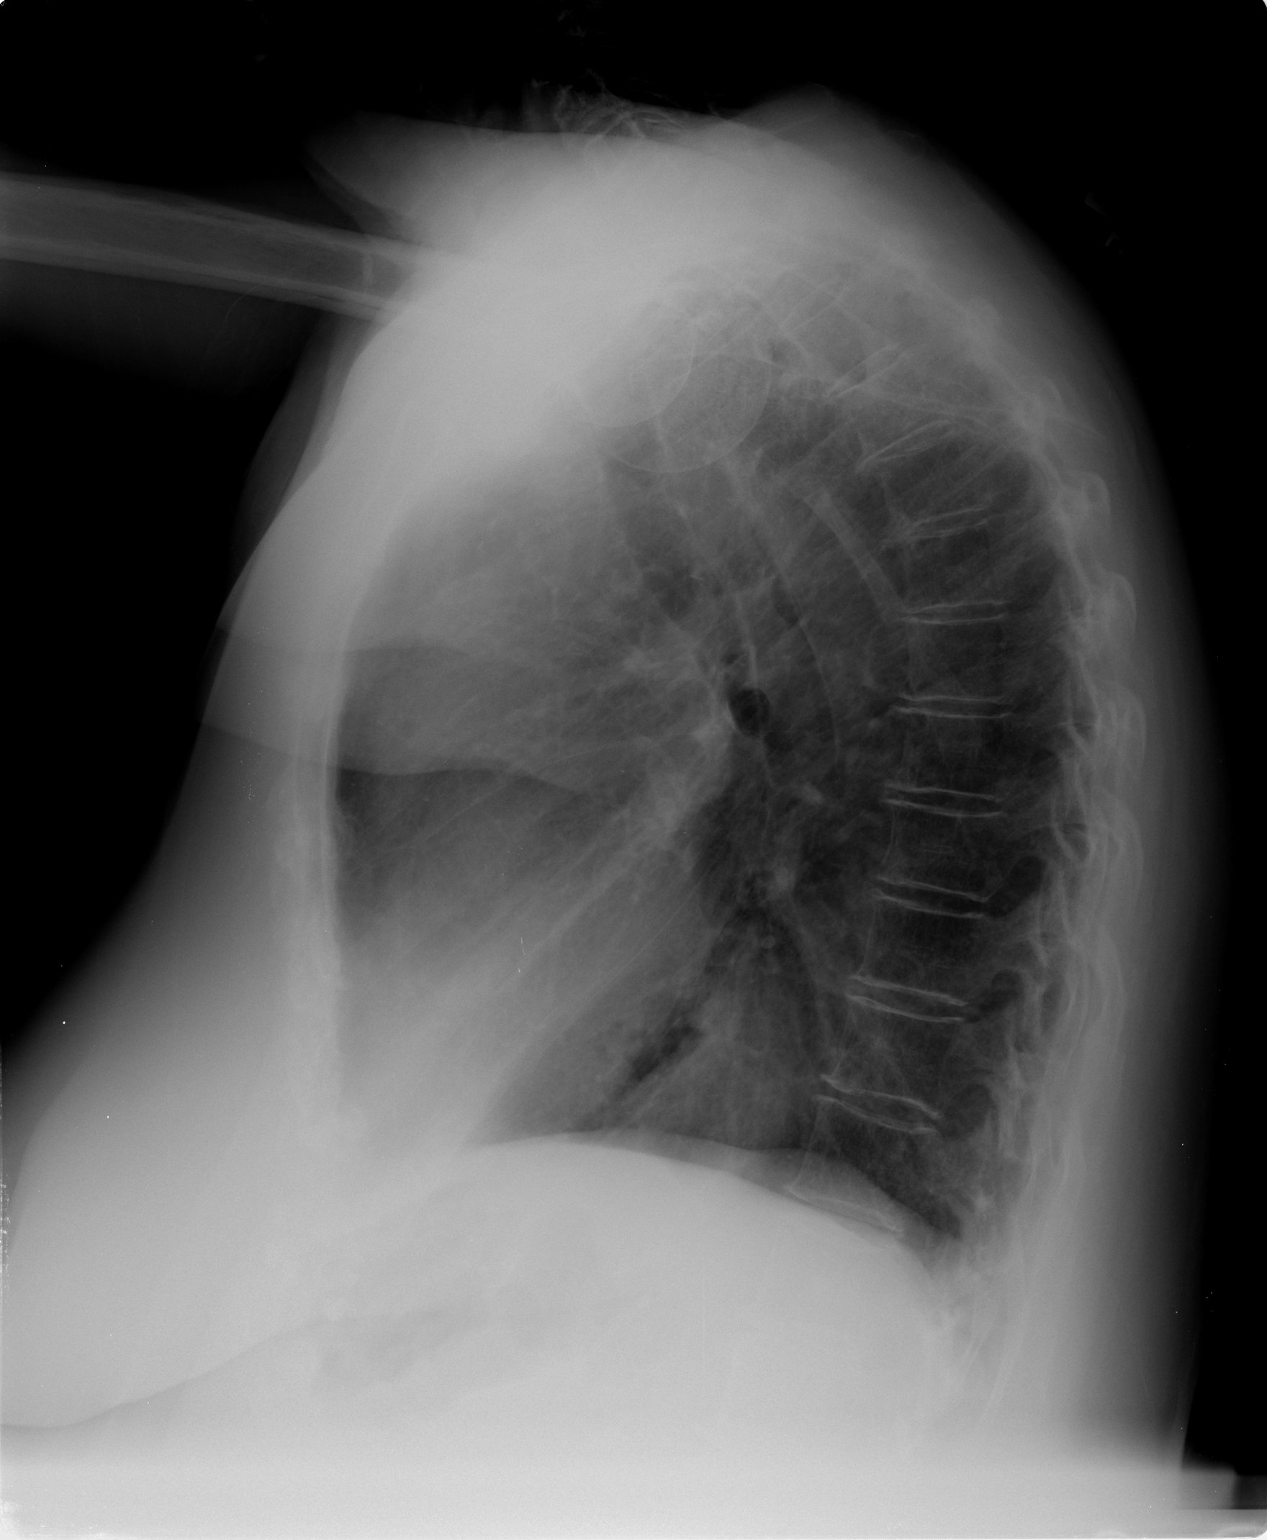

[2 of 2 positions shown; findings below may reference images not displayed]

FINDINGS: Cardiomediastinal silhouette is unremarkable.  No acute
infiltrate or pleural effusion.  No pulmonary edema.  Mild
degenerative changes thoracic spine.  There is moderate sized
hiatal hernia.
IMPRESSION: No acute infiltrate or pulmonary edema.  Moderate sized hiatal
hernia.

## 2012-12-29 IMAGING — CR DG HIP 1V PORT*R*
1 series · 1 of 1 positions shown · non-contrast
Comparison: [HOSPITAL] MRI right hip dated 01/16/2011

CLINICAL DATA: Postop right hip arthroplasty

PORTABLE RIGHT HIP - 1 VIEW

[view not recorded]
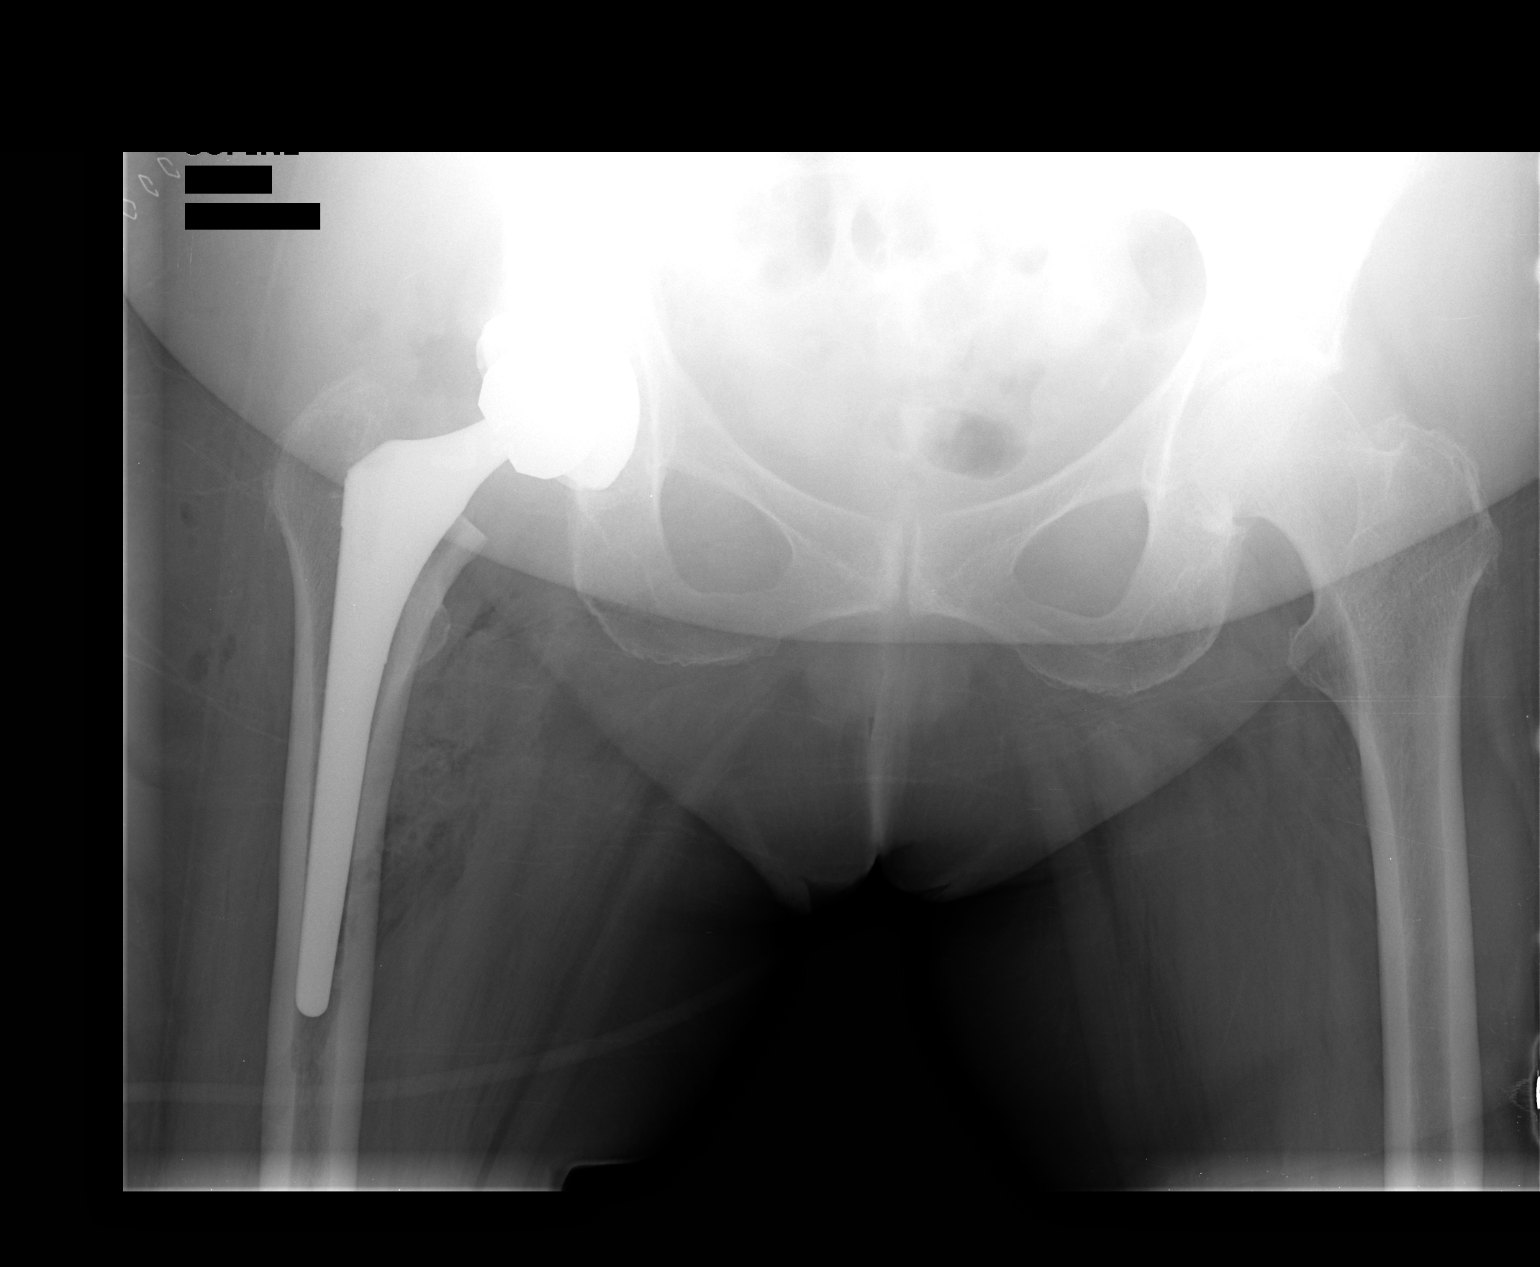

[1 of 1 positions shown; findings below may reference images not displayed]

FINDINGS: Status post right total hip arthroplasty.  Associated
subcutaneous gas.  No evidence of hardware complication.

Mild degenerate changes of the left hip.

No fracture or dislocation is seen.
IMPRESSION: Satisfactory appearance status post right total hip arthroplasty.

## 2013-03-09 ENCOUNTER — Encounter: Payer: Self-pay | Admitting: Physician Assistant

## 2013-03-16 ENCOUNTER — Other Ambulatory Visit: Payer: Self-pay | Admitting: Physician Assistant

## 2013-03-22 ENCOUNTER — Ambulatory Visit (INDEPENDENT_AMBULATORY_CARE_PROVIDER_SITE_OTHER): Payer: Medicare Other | Admitting: Physician Assistant

## 2013-03-22 ENCOUNTER — Encounter: Payer: Self-pay | Admitting: Physician Assistant

## 2013-03-22 VITALS — BP 132/64 | HR 70 | Temp 98.0°F | Resp 16 | Ht 63.5 in | Wt 178.8 lb

## 2013-03-22 DIAGNOSIS — I10 Essential (primary) hypertension: Secondary | ICD-10-CM

## 2013-03-22 DIAGNOSIS — R739 Hyperglycemia, unspecified: Secondary | ICD-10-CM

## 2013-03-22 DIAGNOSIS — E785 Hyperlipidemia, unspecified: Secondary | ICD-10-CM

## 2013-03-22 DIAGNOSIS — Z23 Encounter for immunization: Secondary | ICD-10-CM

## 2013-03-22 DIAGNOSIS — R7309 Other abnormal glucose: Secondary | ICD-10-CM

## 2013-03-22 LAB — COMPREHENSIVE METABOLIC PANEL
ALT: 14 U/L (ref 0–35)
AST: 20 U/L (ref 0–37)
Albumin: 4.6 g/dL (ref 3.5–5.2)
Alkaline Phosphatase: 67 U/L (ref 39–117)
BUN: 24 mg/dL — ABNORMAL HIGH (ref 6–23)
Calcium: 10.1 mg/dL (ref 8.4–10.5)
Potassium: 4.4 mEq/L (ref 3.5–5.3)
Sodium: 135 mEq/L (ref 135–145)
Total Bilirubin: 0.5 mg/dL (ref 0.3–1.2)
Total Protein: 8.4 g/dL — ABNORMAL HIGH (ref 6.0–8.3)

## 2013-03-22 LAB — CBC WITH DIFFERENTIAL/PLATELET
Basophils Absolute: 0.1 10*3/uL (ref 0.0–0.1)
Basophils Relative: 1 % (ref 0–1)
Eosinophils Absolute: 0.1 10*3/uL (ref 0.0–0.7)
Eosinophils Relative: 1 % (ref 0–5)
Hemoglobin: 13.5 g/dL (ref 12.0–15.0)
Lymphs Abs: 3.1 10*3/uL (ref 0.7–4.0)
MCH: 31.1 pg (ref 26.0–34.0)
MCHC: 34.4 g/dL (ref 30.0–36.0)
MCV: 90.6 fL (ref 78.0–100.0)
Monocytes Absolute: 0.5 10*3/uL (ref 0.1–1.0)
Monocytes Relative: 7 % (ref 3–12)
Neutro Abs: 4.1 10*3/uL (ref 1.7–7.7)
Neutrophils Relative %: 52 % (ref 43–77)
Platelets: 307 10*3/uL (ref 150–400)
RBC: 4.34 MIL/uL (ref 3.87–5.11)
RDW: 14 % (ref 11.5–15.5)

## 2013-03-22 LAB — POCT GLYCOSYLATED HEMOGLOBIN (HGB A1C): Hemoglobin A1C: 6.2

## 2013-03-22 LAB — LIPID PANEL
Cholesterol: 167 mg/dL (ref 0–200)
HDL: 49 mg/dL (ref >39)
LDL Cholesterol: 79 mg/dL (ref 0–99)
Triglycerides: 194 mg/dL — ABNORMAL HIGH (ref <150)
VLDL: 39 mg/dL (ref 0–40)

## 2013-03-22 MED ORDER — PRAVASTATIN SODIUM 20 MG PO TABS: ORAL_TABLET | ORAL | Status: DC

## 2013-03-22 MED ORDER — AMLODIPINE BESYLATE 5 MG PO TABS: ORAL_TABLET | ORAL | Status: DC

## 2013-03-22 MED ORDER — LOSARTAN POTASSIUM-HCTZ 100-25 MG PO TABS: 1.0000 | ORAL_TABLET | Freq: Every day | ORAL | Status: DC

## 2013-03-22 NOTE — Patient Instructions (Signed)
Check your blood pressure twice a week and record the results. Let me know the readings in about 4 weeks, to make sure the change in the blood pressure medication is doing what we need it too!

## 2013-03-22 NOTE — Progress Notes (Signed)
  Subjective:    Patient ID: Jessica [name], female    DOB: 04-30-1930, 79 y.o.   MRN: [id number]  HPI  Jessica [name] is an 79 year old white female with a history of HTN, hyperlipidemia, GERD, and elevated fasting blood sugar levels. She is here today for medication refills and has no complaints.   Denies headaches, dizziness, edema. Patient does state that she has a dry cough from her lisinopril but it is 'tolerable.'   Jessica. Jessica [name] denies muscle aches, chest pain, or palpitations. Her GERD is controlled with OTC Nexium. She states that she will have cataract surgery later this fall and is currently using eye drops.    Review of Systems    as above Objective:   Physical Exam  Constitutional: She is oriented to person, place, and time. She appears well-developed and well-nourished.  HENT:  Head: Normocephalic and atraumatic.  Neck: No thyromegaly present.  Cardiovascular: Normal rate, regular rhythm, normal heart sounds and intact distal pulses.   No murmur heard. Pulmonary/Chest: Effort normal and breath sounds normal.  Neurological: She is alert and oriented to person, place, and time.  Skin: Skin is warm and dry.  Psychiatric: She has a normal mood and affect.          Assessment & Plan:  Essential hypertension, benign - Plan: amLODipine (NORVASC) 5 MG tablet, losartan-hydrochlorothiazide (HYZAAR) 100-25 MG per tablet, CBC with Differential, Comprehensive metabolic panel  Other and unspecified hyperlipidemia - Plan: pravastatin (PRAVACHOL) 20 MG tablet, Lipid panel  Need for prophylactic vaccination and inoculation against influenza - Plan: Flu Vaccine QUAD 36+ mos IM  Hyperglycemia - Plan: POCT glucose (manual entry), POCT glycosylated hemoglobin (Hb A1C)   Switch from Lisinopril-HCTZ to Losartan-HCTZ due to dry cough. Patient to check BP twice/week and record values. Patient to let us know her readings in 4 weeks to see if we need to titrate dose of Losartan-HCTZ.  F/U in 6 months or sooner as needed.

## 2013-03-23 NOTE — Progress Notes (Signed)
I have examined this patient along with the student and agree.  

## 2013-04-16 ENCOUNTER — Encounter: Payer: Self-pay | Admitting: Physician Assistant

## 2013-04-16 DIAGNOSIS — I10 Essential (primary) hypertension: Secondary | ICD-10-CM

## 2013-04-16 DIAGNOSIS — E785 Hyperlipidemia, unspecified: Secondary | ICD-10-CM

## 2013-04-18 ENCOUNTER — Other Ambulatory Visit: Payer: Self-pay | Admitting: Physician Assistant

## 2013-04-18 MED ORDER — AMLODIPINE BESYLATE 5 MG PO TABS: ORAL_TABLET | ORAL | Status: DC

## 2013-04-18 MED ORDER — PRAVASTATIN SODIUM 20 MG PO TABS: ORAL_TABLET | ORAL | Status: DC

## 2013-05-07 ENCOUNTER — Other Ambulatory Visit: Payer: Self-pay | Admitting: Physician Assistant

## 2013-09-20 ENCOUNTER — Ambulatory Visit (INDEPENDENT_AMBULATORY_CARE_PROVIDER_SITE_OTHER): Payer: Medicare Other | Admitting: Physician Assistant

## 2013-09-20 ENCOUNTER — Encounter: Payer: Self-pay | Admitting: Physician Assistant

## 2013-09-20 VITALS — BP 150/72 | HR 73 | Temp 98.4°F | Resp 16 | Ht 63.5 in | Wt 176.0 lb

## 2013-09-20 DIAGNOSIS — I10 Essential (primary) hypertension: Secondary | ICD-10-CM

## 2013-09-20 DIAGNOSIS — E119 Type 2 diabetes mellitus without complications: Secondary | ICD-10-CM

## 2013-09-20 DIAGNOSIS — E785 Hyperlipidemia, unspecified: Secondary | ICD-10-CM

## 2013-09-20 LAB — COMPLETE METABOLIC PANEL WITH GFR
ALBUMIN: 4.7 g/dL (ref 3.5–5.2)
ALT: 16 U/L (ref 0–35)
AST: 21 U/L (ref 0–37)
Alkaline Phosphatase: 61 U/L (ref 39–117)
BUN: 28 mg/dL — ABNORMAL HIGH (ref 6–23)
CALCIUM: 10.1 mg/dL (ref 8.4–10.5)
CO2: 24 mEq/L (ref 19–32)
Chloride: 96 mEq/L (ref 96–112)
Creat: 0.85 mg/dL (ref 0.50–1.10)
GFR, Est African American: 74 mL/min
GFR, Est Non African American: 64 mL/min
GLUCOSE: 103 mg/dL — AB (ref 70–99)
POTASSIUM: 4.4 meq/L (ref 3.5–5.3)
Sodium: 132 mEq/L — ABNORMAL LOW (ref 135–145)
Total Bilirubin: 0.5 mg/dL (ref 0.2–1.2)
Total Protein: 7.7 g/dL (ref 6.0–8.3)

## 2013-09-20 LAB — LIPID PANEL
Cholesterol: 151 mg/dL (ref 0–200)
HDL: 53 mg/dL (ref >39)
LDL Cholesterol: 77 mg/dL (ref 0–99)
Total CHOL/HDL Ratio: 2.8 Ratio
Triglycerides: 103 mg/dL (ref <150)
VLDL: 21 mg/dL (ref 0–40)

## 2013-09-20 LAB — POCT GLYCOSYLATED HEMOGLOBIN (HGB A1C): Hemoglobin A1C: 5.8

## 2013-09-20 LAB — GLUCOSE, POCT (MANUAL RESULT ENTRY): POC Glucose: 116 mg/dl — AB (ref 70–99)

## 2013-09-20 MED ORDER — LISINOPRIL-HYDROCHLOROTHIAZIDE 20-12.5 MG PO TABS: 2.0000 | ORAL_TABLET | Freq: Every day | ORAL | Status: DC

## 2013-09-20 NOTE — Patient Instructions (Signed)
I will contact you with your lab results as soon as they are available.   If you have not heard from me in 2 weeks, please contact me.  The fastest way to get your results is to register for My Chart (see the instructions on the last page of this printout).   

## 2013-09-20 NOTE — Progress Notes (Signed)
Subjective:    Patient ID: Jessica [name], female    DOB: Oct 25, 1929, 80 y.o.   MRN: [id number]   PCP: Liz Pinho, PA-C  Chief Complaint  Patient presents with  . Diabetes  . Hyperlipidemia  . Hypertension    pt would like to discuss Amlodipine Besylate she does not feel right since starting on it    Diabetes  Hyperlipidemia  Hypertension    "I just don't feel like myself."  D/C'd lisinopril due to dry cough in 02/2013, but notes that there's been no change in it. She continues on Amlodipine, and started LOSARTANHCT when then lisinoprilHCT was stopped.  Trying to wean herself off Nexium by making healthier eating choices and losing weight.  Review of Systems Denies chest pain, shortness of breath, HA, dizziness, vision change, nausea, vomiting, diarrhea, constipation, melena, hematochezia, dysuria, increased urinary urgency or frequency, increased hunger or thirst, unintentional weight change, unexplained myalgias or arthralgias, rash.     Objective:   Physical Exam  Vitals reviewed. Constitutional: She is oriented to person, place, and time. Vital signs are normal. She appears well-developed and well-nourished. She is active and cooperative. No distress.  BP 150/72  Pulse 73  Temp(Src) 98.4 F (36.9 C) (Oral)  Resp 16  Ht 5' 3.5" (1.613 m)  Wt 176 lb (79.833 kg)  BMI 30.68 kg/m2  SpO2 96%   HENT:  Head: Normocephalic and atraumatic.  Right Ear: Hearing normal.  Left Ear: Hearing normal.  Eyes: Conjunctivae are normal. No scleral icterus.  Neck: Normal range of motion. Neck supple. No thyromegaly present.  Cardiovascular: Normal rate, regular rhythm and normal heart sounds.   Pulses:      Radial pulses are 2+ on the right side, and 2+ on the left side.  Pulmonary/Chest: Effort normal and breath sounds normal.  Lymphadenopathy:       Head (right side): No tonsillar, no preauricular, no posterior auricular and no occipital adenopathy present.   Head (left side): No tonsillar, no preauricular, no posterior auricular and no occipital adenopathy present.    She has no cervical adenopathy.       Right: No supraclavicular adenopathy present.       Left: No supraclavicular adenopathy present.  Neurological: She is alert and oriented to person, place, and time. No sensory deficit.  Skin: Skin is warm, dry and intact. No rash noted. No cyanosis or erythema. Nails show no clubbing.  Psychiatric: She has a normal mood and affect.   Results for orders placed in visit on 09/20/13  GLUCOSE, POCT (MANUAL RESULT ENTRY)      Result Value Ref Range   POC Glucose 116 (*) 70 - 99 mg/dl  POCT GLYCOSYLATED HEMOGLOBIN (HGB A1C)      Result Value Ref Range   Hemoglobin A1C 5.8            Assessment & Plan:  1. Type II or unspecified type diabetes mellitus without mention of complication, not stated as uncontrolled Well-controlled.  COntinue healthy eating and regular exercise. - POCT glucose (manual entry) - POCT glycosylated hemoglobin (Hb A1C) - Microalbumin, urine - HM Diabetes Foot Exam  2. Other and unspecified hyperlipidemia Await labs. Continue current lifestyle changes. - Lipid panel  3. Unspecified essential hypertension Above goal today. D/C losartan HCTZ. Continue Amlodipine. Restart Lisinopril HCTZ. - COMPLETE METABOLIC PANEL WITH GFR - lisinopril-hydrochlorothiazide (PRINZIDE,ZESTORETIC) 20-12.5 MG per tablet; Take 2 tablets by mouth daily.  Dispense: 180 tablet; Refill: 4  Return in about 3 months (around  12/21/2013).  Fara Chute, PA-C Physician Assistant-Certified [facility]

## 2013-12-20 ENCOUNTER — Ambulatory Visit (INDEPENDENT_AMBULATORY_CARE_PROVIDER_SITE_OTHER): Payer: Medicare Other | Admitting: Physician Assistant

## 2013-12-20 ENCOUNTER — Encounter: Payer: Self-pay | Admitting: Physician Assistant

## 2013-12-20 VITALS — BP 148/78 | HR 67 | Temp 98.4°F | Resp 16 | Ht 63.5 in | Wt 175.4 lb

## 2013-12-20 DIAGNOSIS — I10 Essential (primary) hypertension: Secondary | ICD-10-CM

## 2013-12-20 DIAGNOSIS — E785 Hyperlipidemia, unspecified: Secondary | ICD-10-CM

## 2013-12-20 DIAGNOSIS — E119 Type 2 diabetes mellitus without complications: Secondary | ICD-10-CM

## 2013-12-20 LAB — POCT GLYCOSYLATED HEMOGLOBIN (HGB A1C): HEMOGLOBIN A1C: 5.8

## 2013-12-20 LAB — COMPLETE METABOLIC PANEL WITH GFR
ALK PHOS: 62 U/L (ref 39–117)
ALT: 10 U/L (ref 0–35)
AST: 19 U/L (ref 0–37)
Albumin: 4.8 g/dL (ref 3.5–5.2)
BILIRUBIN TOTAL: 0.5 mg/dL (ref 0.2–1.2)
BUN: 21 mg/dL (ref 6–23)
CO2: 25 mEq/L (ref 19–32)
CREATININE: 0.95 mg/dL (ref 0.50–1.10)
Calcium: 10.1 mg/dL (ref 8.4–10.5)
Chloride: 96 mEq/L (ref 96–112)
GFR, EST NON AFRICAN AMERICAN: 56 mL/min — AB
GFR, Est African American: 65 mL/min
GLUCOSE: 108 mg/dL — AB (ref 70–99)
Potassium: 4.5 mEq/L (ref 3.5–5.3)
Sodium: 132 mEq/L — ABNORMAL LOW (ref 135–145)
TOTAL PROTEIN: 8.5 g/dL — AB (ref 6.0–8.3)

## 2013-12-20 LAB — GLUCOSE, POCT (MANUAL RESULT ENTRY): POC Glucose: 113 mg/dl — AB (ref 70–99)

## 2013-12-20 LAB — LIPID PANEL
CHOL/HDL RATIO: 3.1 ratio
Cholesterol: 163 mg/dL (ref 0–200)
HDL: 53 mg/dL (ref >39)
LDL Cholesterol: 78 mg/dL (ref 0–99)
Triglycerides: 160 mg/dL — ABNORMAL HIGH (ref <150)
VLDL: 32 mg/dL (ref 0–40)

## 2013-12-20 NOTE — Progress Notes (Addendum)
Subjective:    Patient ID: Jessica [name], female    DOB: 1929-12-07, 80 y.o.   MRN: [id number]   PCP: JEFFERY,CHELLE, PA-C  Chief Complaint  Patient presents with  . Diabetes    3 month follow up  . Hypertension    Medications, allergies, past medical history, surgical history, family history, social history and problem list reviewed and updated.  Patient Active Problem List   Diagnosis Date Noted  . Postoperative anemia due to acute blood loss 09/26/2011  . Lymphocytic colitis   . Incontinence   . Impaired hearing   . Glaucoma   . Cataracts, bilateral   . Urinary urgency   . Urinary frequency   . Constipation   . Back pain   . Preop cardiovascular exam 09/18/2011  . GERD (gastroesophageal reflux disease)   . Pain, lumbar region   . Cystocele   . Low bone mass   . Type II or unspecified type diabetes mellitus without mention of complication, not stated as uncontrolled   . Osteoarthritis   . COLONIC POLYPS, ADENOMATOUS 09/09/2007  . HYPERLIPIDEMIA 09/09/2007  . HYPERTENSION 09/09/2007  . COLITIS 02/16/2007    Prior to Admission medications   Medication Sig Start Date End Date Taking? Authorizing Provider  amLODipine (NORVASC) 5 MG tablet TAKE 1 TABLET (5 MG TOTAL) BY MOUTH DAILY. 04/18/13  Yes Chelle S Jeffery, PA-C  Biotin 1 MG CAPS Take by mouth.   Yes Historical Provider, MD  cycloSPORINE (RESTASIS) 0.05 % ophthalmic emulsion Place 1 drop into both eyes 2 (two) times daily.   Yes Historical Provider, MD  esomeprazole (NEXIUM) 20 MG capsule Take 20 mg by mouth daily at 12 noon.   Yes Historical Provider, MD  GLUCOSAMINE PO Take by mouth.   Yes Historical Provider, MD  HYDROcodone-acetaminophen (NORCO) 5-325 MG per tablet Take 1 tablet by mouth every 6 (six) hours as needed for pain. 09/07/12  Yes Chelle S Jeffery, PA-C  lisinopril-hydrochlorothiazide (PRINZIDE,ZESTORETIC) 20-12.5 MG per tablet Take 2 tablets by mouth daily. 09/20/13  Yes Chelle S Jeffery, PA-C    Multiple Vitamins-Minerals (MULTIVITAMIN WITH MINERALS) tablet Take 1 tablet by mouth daily.   Yes Historical Provider, MD  PATADAY 0.2 % SOLN  01/18/13  Yes Historical Provider, MD  pravastatin (PRAVACHOL) 20 MG tablet TAKE 1 TABLET (20 MG TOTAL) BY MOUTH EVERY EVENING. 04/18/13  Yes Chelle S Jeffery, PA-C    Diabetes Pertinent negatives for diabetes include no chest pain.  Hypertension Pertinent negatives include no chest pain or palpitations.   Presents to update labs and follow-up on DM, HTN and lipids.  Continues to have RIGHT knee pain, but otherwise feels pretty well.  She does not have a living will. Wants no extreme measures-doesn't want to be on life support "for what time I have left." Reports her children know what she wants, but that she doesn't have anything in writing.  She declines DEXA scan for osteoporosis screening, stating, "What are they really gonna do about it."   Review of Systems  Constitutional: Negative.   HENT: Negative.   Eyes: Negative.   Respiratory: Negative.   Cardiovascular: Positive for leg swelling (R>L, in the evenings, worse after spending longer hours on her feet.). Negative for chest pain and palpitations.  Gastrointestinal: Negative.   Endocrine: Negative.   Genitourinary: Negative.   Musculoskeletal: Positive for arthralgias (RIGHT knee, due to osteoarthritis) and back pain.  Skin: Negative.   Neurological: Negative.   Psychiatric/Behavioral: Negative.  Objective:   Physical Exam  Vitals reviewed. Constitutional: She is oriented to person, place, and time. She appears well-developed and well-nourished. No distress.  BP 148/78  Pulse 67  Temp(Src) 98.4 F (36.9 C) (Oral)  Resp 16  Ht 5' 3.5" (1.613 m)  Wt 175 lb 6.4 oz (79.561 kg)  BMI 30.58 kg/m2  SpO2 96%   Eyes: Conjunctivae are normal. No scleral icterus.  Neck: No thyromegaly present.  Cardiovascular: Normal rate, regular rhythm, normal heart sounds and intact  distal pulses.   Pulmonary/Chest: Effort normal and breath sounds normal.  Lymphadenopathy:    She has no cervical adenopathy.  Neurological: She is alert and oriented to person, place, and time.  Skin: Skin is warm and dry.  Psychiatric: She has a normal mood and affect. Her behavior is normal.    See diabetic foot exam.   Results for orders placed in visit on 12/20/13  GLUCOSE, POCT (MANUAL RESULT ENTRY)      Result Value Ref Range   POC Glucose 113 (*) 70 - 99 mg/dl  POCT GLYCOSYLATED HEMOGLOBIN (HGB A1C)      Result Value Ref Range   Hemoglobin A1C 5.8         Assessment & Plan:  1. Type II or unspecified type diabetes mellitus without mention of complication, not stated as uncontrolled Well controlled. Continue healthy lifestyle changes. - POCT glucose (manual entry) - POCT glycosylated hemoglobin (Hb A1C)  2. HYPERLIPIDEMIA Await labs.  Continue current regimen. - Lipid panel  3. HYPERTENSION Slightly elevated today.  Continue current regimen for now. - Microalbumin, urine - COMPLETE METABOLIC PANEL WITH GFR   Fara Chute, PA-C Physician Assistant-Certified [facility]

## 2013-12-20 NOTE — Patient Instructions (Addendum)
I will contact you with your lab results as soon as they are available.   If you have not heard from me in 2 weeks, please contact me.  The fastest way to get your results is to register for My Chart (see the instructions on the last page of this printout).  Keep up the great work-and don't worry too much. They say that salt water is the cure for everything: sweat, tears or the sea.

## 2014-01-11 ENCOUNTER — Telehealth: Payer: Self-pay | Admitting: Physician Assistant

## 2014-01-11 DIAGNOSIS — M1990 Unspecified osteoarthritis, unspecified site: Secondary | ICD-10-CM

## 2014-01-11 DIAGNOSIS — M549 Dorsalgia, unspecified: Secondary | ICD-10-CM

## 2014-01-11 MED ORDER — HYDROCODONE-ACETAMINOPHEN 5-325 MG PO TABS: 1.0000 | ORAL_TABLET | Freq: Four times a day (QID) | ORAL | Status: DC | PRN

## 2014-01-11 NOTE — Telephone Encounter (Signed)
Patient notified via My Chart. Meds ordered this encounter  Medications  . HYDROcodone-acetaminophen (NORCO) 5-325 MG per tablet    Sig: Take 1 tablet by mouth every 6 (six) hours as needed.    Dispense:  60 tablet    Refill:  0    Order Specific Question:  Supervising Provider    Answer:  DOOLITTLE, ROBERT P [[id number]]

## 2014-03-28 ENCOUNTER — Ambulatory Visit (INDEPENDENT_AMBULATORY_CARE_PROVIDER_SITE_OTHER): Payer: Medicare Other | Admitting: Emergency Medicine

## 2014-03-28 VITALS — BP 155/70 | HR 70 | Temp 97.6°F | Resp 16 | Ht 63.5 in | Wt 176.0 lb

## 2014-03-28 DIAGNOSIS — I10 Essential (primary) hypertension: Secondary | ICD-10-CM

## 2014-03-28 DIAGNOSIS — Z23 Encounter for immunization: Secondary | ICD-10-CM

## 2014-03-28 DIAGNOSIS — E785 Hyperlipidemia, unspecified: Secondary | ICD-10-CM

## 2014-03-28 LAB — COMPLETE METABOLIC PANEL WITH GFR
ALT: 15 U/L (ref 0–35)
AST: 20 U/L (ref 0–37)
Albumin: 4.3 g/dL (ref 3.5–5.2)
Alkaline Phosphatase: 65 U/L (ref 39–117)
BUN: 22 mg/dL (ref 6–23)
CALCIUM: 9.9 mg/dL (ref 8.4–10.5)
CHLORIDE: 98 meq/L (ref 96–112)
CO2: 24 mEq/L (ref 19–32)
Creat: 0.85 mg/dL (ref 0.50–1.10)
GFR, EST NON AFRICAN AMERICAN: 64 mL/min
GFR, Est African American: 74 mL/min
GLUCOSE: 96 mg/dL (ref 70–99)
POTASSIUM: 4.4 meq/L (ref 3.5–5.3)
Sodium: 133 mEq/L — ABNORMAL LOW (ref 135–145)
Total Bilirubin: 0.5 mg/dL (ref 0.2–1.2)
Total Protein: 7.8 g/dL (ref 6.0–8.3)

## 2014-03-28 LAB — LIPID PANEL
CHOL/HDL RATIO: 2.9 ratio
CHOLESTEROL: 165 mg/dL (ref 0–200)
HDL: 56 mg/dL (ref >39)
LDL Cholesterol: 75 mg/dL (ref 0–99)
Triglycerides: 172 mg/dL — ABNORMAL HIGH (ref <150)
VLDL: 34 mg/dL (ref 0–40)

## 2014-03-28 LAB — CBC WITH DIFFERENTIAL/PLATELET
Basophils Absolute: 0.1 10*3/uL (ref 0.0–0.1)
Basophils Relative: 1 % (ref 0–1)
Eosinophils Absolute: 0.2 10*3/uL (ref 0.0–0.7)
Eosinophils Relative: 2 % (ref 0–5)
HEMATOCRIT: 36.9 % (ref 36.0–46.0)
HEMOGLOBIN: 12.5 g/dL (ref 12.0–15.0)
LYMPHS ABS: 3.7 10*3/uL (ref 0.7–4.0)
LYMPHS PCT: 43 % (ref 12–46)
MCH: 30.6 pg (ref 26.0–34.0)
MCHC: 33.9 g/dL (ref 30.0–36.0)
MCV: 90.2 fL (ref 78.0–100.0)
MONO ABS: 0.6 10*3/uL (ref 0.1–1.0)
MONOS PCT: 7 % (ref 3–12)
Neutro Abs: 4 10*3/uL (ref 1.7–7.7)
Neutrophils Relative %: 47 % (ref 43–77)
Platelets: 323 10*3/uL (ref 150–400)
RBC: 4.09 MIL/uL (ref 3.87–5.11)
RDW: 14.3 % (ref 11.5–15.5)
WBC: 8.6 10*3/uL (ref 4.0–10.5)

## 2014-03-28 NOTE — Progress Notes (Signed)
   Subjective:    Patient ID: Jessica [name], female    DOB: 1929/09/01, 80 y.o.   MRN: [id number]  HPI Patient of Jessica [name] presents in follow up today. She has a history of hyperlipidemia and hypertension. She states she feels great has no complaints at the present time.   Review of Systems     Objective:   Physical Exam HEENT exam is unremarkable neck was supple. Chest clear to auscultation and percussion. Heart regular rate no murmurs. No orders of the defined types were placed in this encounter.          Assessment & Plan:  Vaccinations were updated. No change in medications at the present time. Labs were done. Patient given flu shot and Prevnar today

## 2014-04-15 ENCOUNTER — Other Ambulatory Visit: Payer: Self-pay | Admitting: Physician Assistant

## 2014-07-11 ENCOUNTER — Ambulatory Visit: Payer: Medicare Other | Admitting: Physician Assistant

## 2014-07-18 ENCOUNTER — Encounter: Payer: Self-pay | Admitting: Physician Assistant

## 2014-07-18 ENCOUNTER — Ambulatory Visit (INDEPENDENT_AMBULATORY_CARE_PROVIDER_SITE_OTHER): Payer: Medicare Other | Admitting: Physician Assistant

## 2014-07-18 VITALS — BP 128/74 | HR 73 | Temp 97.4°F | Resp 16 | Ht 64.0 in | Wt 177.0 lb

## 2014-07-18 DIAGNOSIS — K52832 Lymphocytic colitis: Secondary | ICD-10-CM

## 2014-07-18 DIAGNOSIS — M170 Bilateral primary osteoarthritis of knee: Secondary | ICD-10-CM

## 2014-07-18 DIAGNOSIS — K5289 Other specified noninfective gastroenteritis and colitis: Secondary | ICD-10-CM

## 2014-07-18 DIAGNOSIS — Z23 Encounter for immunization: Secondary | ICD-10-CM | POA: Diagnosis not present

## 2014-07-18 DIAGNOSIS — N3281 Overactive bladder: Secondary | ICD-10-CM

## 2014-07-18 DIAGNOSIS — E119 Type 2 diabetes mellitus without complications: Secondary | ICD-10-CM

## 2014-07-18 DIAGNOSIS — Z1239 Encounter for other screening for malignant neoplasm of breast: Secondary | ICD-10-CM

## 2014-07-18 DIAGNOSIS — E785 Hyperlipidemia, unspecified: Secondary | ICD-10-CM | POA: Diagnosis not present

## 2014-07-18 DIAGNOSIS — I10 Essential (primary) hypertension: Secondary | ICD-10-CM

## 2014-07-18 DIAGNOSIS — M5441 Lumbago with sciatica, right side: Secondary | ICD-10-CM

## 2014-07-18 DIAGNOSIS — M8580 Other specified disorders of bone density and structure, unspecified site: Secondary | ICD-10-CM

## 2014-07-18 LAB — COMPREHENSIVE METABOLIC PANEL
ALBUMIN: 4.6 g/dL (ref 3.5–5.2)
ALT: 16 U/L (ref 0–35)
AST: 21 U/L (ref 0–37)
Alkaline Phosphatase: 60 U/L (ref 39–117)
BILIRUBIN TOTAL: 0.5 mg/dL (ref 0.2–1.2)
BUN: 22 mg/dL (ref 6–23)
CALCIUM: 10 mg/dL (ref 8.4–10.5)
CHLORIDE: 100 meq/L (ref 96–112)
CO2: 24 mEq/L (ref 19–32)
CREATININE: 0.83 mg/dL (ref 0.50–1.10)
Glucose, Bld: 92 mg/dL (ref 70–99)
Potassium: 4.6 mEq/L (ref 3.5–5.3)
Sodium: 135 mEq/L (ref 135–145)
TOTAL PROTEIN: 8.2 g/dL (ref 6.0–8.3)

## 2014-07-18 LAB — HEMOGLOBIN A1C
HEMOGLOBIN A1C: 5.9 % — AB (ref <5.7)
Mean Plasma Glucose: 123 mg/dL — ABNORMAL HIGH (ref <117)

## 2014-07-18 LAB — POCT GLYCOSYLATED HEMOGLOBIN (HGB A1C): Hemoglobin A1C: 5.8

## 2014-07-18 LAB — GLUCOSE, POCT (MANUAL RESULT ENTRY): POC GLUCOSE: 111 mg/dL — AB (ref 70–99)

## 2014-07-18 NOTE — Progress Notes (Signed)
Subjective:    Patient ID: Jessica [name], female    DOB: [birth year], 81 y.o.   MRN: [id number]   PCP: Elynor Kallenberger, PA-C  Chief Complaint  Patient presents with  . Follow-up  . Hypertension  . Lab work    No Known Allergies  Patient Active Problem List   Diagnosis Date Noted  . Lymphocytic colitis   . Overactive bladder   . Impaired hearing   . Glaucoma   . Cataracts, bilateral   . Constipation   . Back pain   . GERD (gastroesophageal reflux disease)   . Cystocele   . Low bone mass   . Diabetes type 2, controlled   . Osteoarthritis   . COLONIC POLYPS, ADENOMATOUS 09/09/2007  . Hyperlipidemia 09/09/2007  . HTN (hypertension) 09/09/2007  . COLITIS 02/16/2007    Prior to Admission medications   Medication Sig Start Date End Date Taking? Authorizing Provider  amLODipine (NORVASC) 5 MG tablet TAKE 1 TABLET (5 MG TOTAL) BY MOUTH DAILY. 04/15/14  Yes Darlyne Russian, MD  Biotin 1 MG CAPS Take by mouth.   Yes Historical Provider, MD  cycloSPORINE (RESTASIS) 0.05 % ophthalmic emulsion Place 1 drop into both eyes 2 (two) times daily.   Yes Historical Provider, MD  esomeprazole (NEXIUM) 20 MG capsule Take 20 mg by mouth daily at 12 noon.   Yes Historical Provider, MD  GLUCOSAMINE PO Take by mouth.   Yes Historical Provider, MD  HYDROcodone-acetaminophen (NORCO) 5-325 MG per tablet Take 1 tablet by mouth every 6 (six) hours as needed. 01/11/14  Yes Mahmoud Blazejewski S Emri Sample, PA-C  lisinopril-hydrochlorothiazide (PRINZIDE,ZESTORETIC) 20-12.5 MG per tablet Take 2 tablets by mouth daily. 09/20/13  Yes Jake Fuhrmann S Magaret Justo, PA-C  Multiple Vitamins-Minerals (MULTIVITAMIN WITH MINERALS) tablet Take 1 tablet by mouth daily.   Yes Historical Provider, MD  PATADAY 0.2 % SOLN  01/18/13  Yes Historical Provider, MD  pravastatin (PRAVACHOL) 20 MG tablet TAKE 1 TABLET (20 MG TOTAL) BY MOUTH EVERY EVENING. 04/15/14  Yes Darlyne Russian, MD    Medical, Surgical, Family and Social History reviewed and  updated.  HPI  Presents for follow-up of chronic medical problems, specifically HTN, hyperlipidemia and diabetes. She's tolerating her medications well, without adverse effects, and is managing diabetes with lifestyle modifications.    Review of Systems Denies chest pain, shortness of breath, HA, dizziness, vision change, nausea, vomiting, diarrhea, constipation, melena, hematochezia, dysuria, increased urinary urgency or frequency, increased hunger or thirst, unintentional weight change, unexplained myalgias or arthralgias, rash.     Objective:   Physical Exam  Constitutional: She is oriented to person, place, and time. She appears well-developed and well-nourished. No distress.  BP 128/74 mmHg  Pulse 73  Temp(Src) 97.4 F (36.3 C) (Oral)  Resp 16  Ht 5\' 4"  (1.626 m)  Wt 177 lb (80.287 kg)  BMI 30.37 kg/m2  SpO2 99%   Eyes: Conjunctivae are normal. No scleral icterus.  Neck: No thyromegaly present.  Cardiovascular: Normal rate, regular rhythm, normal heart sounds and intact distal pulses.   Pulmonary/Chest: Effort normal and breath sounds normal.  Lymphadenopathy:    She has no cervical adenopathy.  Neurological: She is alert and oriented to person, place, and time.  Skin: Skin is warm and dry.  Psychiatric: She has a normal mood and affect. Her behavior is normal.          Assessment & Plan:  1. Essential hypertension Controlled. Continue current treatment.   2. Hyperlipidemia Await labs. Continue current  treatment.  3. Diabetes type 2, controlled Await labs (sending out A1C to test controls). Continue healthy lifestyle. - POCT glucose (manual entry) - POCT glycosylated hemoglobin (Hb A1C) - Comprehensive metabolic panel - Hemoglobin A1c  4. Primary osteoarthritis of both knees Stable. Not interested i  5. Low bone mass Update DEXA and vitamin D level. - DG Bone Density; Future - Vit D  25 hydroxy (rtn osteoporosis monitoring)  6. Screening for breast  cancer She intends to call to schedule herself. - MM Digital Screening; Future  7. Need for tetanus booster - Td vaccine greater than or equal to 7yo preservative free IM  RTC 3-4 months.  Fara Chute, PA-C Physician Assistant-Certified [facility]

## 2014-07-19 LAB — VITAMIN D 25 HYDROXY (VIT D DEFICIENCY, FRACTURES): Vit D, 25-Hydroxy: 38 ng/mL (ref 30–100)

## 2014-10-24 DIAGNOSIS — Z9849 Cataract extraction status, unspecified eye: Secondary | ICD-10-CM | POA: Diagnosis not present

## 2014-10-24 DIAGNOSIS — H40013 Open angle with borderline findings, low risk, bilateral: Secondary | ICD-10-CM | POA: Diagnosis not present

## 2014-10-24 LAB — HM DIABETES EYE EXAM

## 2014-10-30 ENCOUNTER — Other Ambulatory Visit: Payer: Self-pay | Admitting: Physician Assistant

## 2014-10-30 DIAGNOSIS — D485 Neoplasm of uncertain behavior of skin: Secondary | ICD-10-CM | POA: Diagnosis not present

## 2014-10-30 DIAGNOSIS — C44319 Basal cell carcinoma of skin of other parts of face: Secondary | ICD-10-CM | POA: Diagnosis not present

## 2014-11-14 ENCOUNTER — Ambulatory Visit (INDEPENDENT_AMBULATORY_CARE_PROVIDER_SITE_OTHER): Payer: Medicare Other | Admitting: Physician Assistant

## 2014-11-14 ENCOUNTER — Encounter: Payer: Self-pay | Admitting: Physician Assistant

## 2014-11-14 VITALS — BP 166/76 | HR 69 | Temp 97.8°F | Resp 16 | Ht 63.5 in | Wt 178.8 lb

## 2014-11-14 DIAGNOSIS — E119 Type 2 diabetes mellitus without complications: Secondary | ICD-10-CM | POA: Diagnosis not present

## 2014-11-14 DIAGNOSIS — M8580 Other specified disorders of bone density and structure, unspecified site: Secondary | ICD-10-CM

## 2014-11-14 DIAGNOSIS — E785 Hyperlipidemia, unspecified: Secondary | ICD-10-CM

## 2014-11-14 DIAGNOSIS — Z6830 Body mass index (BMI) 30.0-30.9, adult: Secondary | ICD-10-CM | POA: Insufficient documentation

## 2014-11-14 DIAGNOSIS — M5441 Lumbago with sciatica, right side: Secondary | ICD-10-CM | POA: Diagnosis not present

## 2014-11-14 DIAGNOSIS — M170 Bilateral primary osteoarthritis of knee: Secondary | ICD-10-CM | POA: Diagnosis not present

## 2014-11-14 DIAGNOSIS — K5900 Constipation, unspecified: Secondary | ICD-10-CM

## 2014-11-14 DIAGNOSIS — Z6831 Body mass index (BMI) 31.0-31.9, adult: Secondary | ICD-10-CM

## 2014-11-14 DIAGNOSIS — I10 Essential (primary) hypertension: Secondary | ICD-10-CM | POA: Diagnosis not present

## 2014-11-14 LAB — CBC WITH DIFFERENTIAL/PLATELET
BASOS ABS: 0 10*3/uL (ref 0.0–0.1)
BASOS PCT: 0 % (ref 0–1)
Eosinophils Absolute: 0.1 10*3/uL (ref 0.0–0.7)
Eosinophils Relative: 1 % (ref 0–5)
HCT: 36.2 % (ref 36.0–46.0)
Hemoglobin: 12.3 g/dL (ref 12.0–15.0)
LYMPHS PCT: 39 % (ref 12–46)
Lymphs Abs: 3.6 10*3/uL (ref 0.7–4.0)
MCH: 31.1 pg (ref 26.0–34.0)
MCHC: 34 g/dL (ref 30.0–36.0)
MCV: 91.4 fL (ref 78.0–100.0)
MPV: 9.6 fL (ref 8.6–12.4)
Monocytes Absolute: 0.6 10*3/uL (ref 0.1–1.0)
Monocytes Relative: 6 % (ref 3–12)
NEUTROS ABS: 5 10*3/uL (ref 1.7–7.7)
NEUTROS PCT: 54 % (ref 43–77)
PLATELETS: 328 10*3/uL (ref 150–400)
RBC: 3.96 MIL/uL (ref 3.87–5.11)
RDW: 13.9 % (ref 11.5–15.5)
WBC: 9.3 10*3/uL (ref 4.0–10.5)

## 2014-11-14 LAB — POCT GLYCOSYLATED HEMOGLOBIN (HGB A1C): Hemoglobin A1C: 5.6

## 2014-11-14 LAB — LIPID PANEL
Cholesterol: 163 mg/dL (ref 0–200)
HDL: 58 mg/dL (ref >=46)
LDL CALC: 66 mg/dL (ref 0–99)
Total CHOL/HDL Ratio: 2.8 Ratio
Triglycerides: 194 mg/dL — ABNORMAL HIGH (ref <150)
VLDL: 39 mg/dL (ref 0–40)

## 2014-11-14 LAB — COMPREHENSIVE METABOLIC PANEL
ALT: 14 U/L (ref 0–35)
AST: 19 U/L (ref 0–37)
Albumin: 4.3 g/dL (ref 3.5–5.2)
Alkaline Phosphatase: 59 U/L (ref 39–117)
BUN: 24 mg/dL — ABNORMAL HIGH (ref 6–23)
CHLORIDE: 101 meq/L (ref 96–112)
CO2: 22 meq/L (ref 19–32)
Calcium: 9.9 mg/dL (ref 8.4–10.5)
Creat: 0.78 mg/dL (ref 0.50–1.10)
Glucose, Bld: 103 mg/dL — ABNORMAL HIGH (ref 70–99)
Potassium: 4.7 mEq/L (ref 3.5–5.3)
SODIUM: 136 meq/L (ref 135–145)
TOTAL PROTEIN: 7.9 g/dL (ref 6.0–8.3)
Total Bilirubin: 0.4 mg/dL (ref 0.2–1.2)

## 2014-11-14 LAB — MICROALBUMIN, URINE: Microalb, Ur: 0.5 mg/dL (ref <2.0)

## 2014-11-14 LAB — GLUCOSE, POCT (MANUAL RESULT ENTRY): POC Glucose: 112 mg/dl — AB (ref 70–99)

## 2014-11-14 MED ORDER — AMLODIPINE BESYLATE 10 MG PO TABS: 10.0000 mg | ORAL_TABLET | Freq: Every day | ORAL | Status: DC

## 2014-11-14 MED ORDER — HYDROCODONE-ACETAMINOPHEN 7.5-325 MG PO TABS: 1.0000 | ORAL_TABLET | Freq: Four times a day (QID) | ORAL | Status: DC | PRN

## 2014-11-14 NOTE — Progress Notes (Signed)
  Medical screening examination/treatment/procedure(s) were performed by non-physician practitioner and as supervising physician I was immediately available for consultation/collaboration.     

## 2014-11-14 NOTE — Addendum Note (Signed)
Addended by: Roselee Culver on: 11/14/2014 05:23 PM   Modules accepted: Miquel Dunn

## 2014-11-14 NOTE — Progress Notes (Signed)
Patient ID: Jessica [name], female    DOB: Jan 01, 1930, 81 y.o.   MRN: [id number]  PCP: Wynne Dust  Subjective:   Chief Complaint  Patient presents with  . Follow-up    4 mos  . Hypertension  . Medication Refill    Hydrocodone-acetaminophen 5-325 mg    HPI Presents for evaluation of HTN, hyperglycemia.  She needs medication refill of hydrocodone, which she uses for pain in the knee and back. She still has pills left from my last prescription, but they are expired and she doesn't feel safe taking expired medication.  Feels good over all. No new problems. Has found a small skin cancer lateral to the LEFT eye, scheduled for removal in 11/2014. Joints continue to cause pain, especially back and RIGHT knee. She is s/p THR on the RIGHT. Plans to follow-up with Dr. Percell Miller for another injection, but isn't quite ready. Rarely uses hydrocodone, but when she does, the 5 mg dose isn't so helpful. Requests a higher dose.  Always has constiptation. Urinary urgency.  Eye exam a few weeks ago. BP there was normal.  Review of Systems  Constitutional: Negative.   HENT: Negative.   Eyes: Negative for visual disturbance.  Respiratory: Negative.   Cardiovascular: Negative.   Gastrointestinal: Positive for constipation. Negative for nausea, vomiting, abdominal pain, diarrhea, blood in stool, abdominal distention, anal bleeding and rectal pain.  Endocrine: Negative.   Genitourinary: Positive for urgency. Negative for dysuria, frequency, hematuria, vaginal discharge and pelvic pain.  Musculoskeletal: Positive for myalgias, back pain, arthralgias and gait problem. Negative for neck pain.  Neurological: Negative for dizziness, weakness and headaches.  Psychiatric/Behavioral: Negative for sleep disturbance, self-injury and dysphoric mood. The patient is not nervous/anxious.        Patient Active Problem List   Diagnosis Date Noted  . Lymphocytic colitis   . Overactive bladder   .  Impaired hearing   . Glaucoma   . Cataracts, bilateral   . Constipation   . Back pain   . GERD (gastroesophageal reflux disease)   . Cystocele   . Low bone mass   . Diabetes type 2, controlled   . Osteoarthritis   . COLONIC POLYPS, ADENOMATOUS 09/09/2007  . Hyperlipidemia 09/09/2007  . HTN (hypertension) 09/09/2007  . COLITIS 02/16/2007     Prior to Admission medications   Medication Sig Start Date End Date Taking? Authorizing Provider  amLODipine (NORVASC) 5 MG tablet TAKE 1 TABLET (5 MG TOTAL) BY MOUTH DAILY. 04/15/14  Yes Darlyne Russian, MD  Biotin 1 MG CAPS Take by mouth.   Yes Historical Provider, MD  cycloSPORINE (RESTASIS) 0.05 % ophthalmic emulsion Place 1 drop into both eyes 2 (two) times daily.   Yes Historical Provider, MD  esomeprazole (NEXIUM) 20 MG capsule Take 20 mg by mouth daily at 12 noon.   Yes Historical Provider, MD  GLUCOSAMINE PO Take by mouth.   Yes Historical Provider, MD  HYDROcodone-acetaminophen (NORCO) 5-325 MG per tablet Take 1 tablet by mouth every 6 (six) hours as needed. 01/11/14  Yes Dakia Schifano, PA-C  lisinopril-hydrochlorothiazide (PRINZIDE,ZESTORETIC) 20-12.5 MG per tablet Take 2 tablets by mouth daily. 09/20/13  Yes Peytyn Trine, PA-C  Multiple Vitamins-Minerals (MULTIVITAMIN WITH MINERALS) tablet Take 1 tablet by mouth daily.   Yes Historical Provider, MD  PATADAY 0.2 % SOLN  01/18/13  Yes Historical Provider, MD  pravastatin (PRAVACHOL) 20 MG tablet TAKE 1 TABLET (20 MG TOTAL) BY MOUTH EVERY EVENING. 04/15/14  Yes Darlyne Russian, MD  No Known Allergies     Objective:  Physical Exam  Constitutional: She is oriented to person, place, and time. She appears well-developed and well-nourished. No distress.  BP 166/76 mmHg  Pulse 69  Temp(Src) 97.8 F (36.6 C) (Oral)  Resp 16  Ht 5' 3.5" (1.613 m)  Wt 178 lb 12.8 oz (81.103 kg)  BMI 31.17 kg/m2  SpO2 97%   Eyes: Conjunctivae are normal. No scleral icterus.  Neck: No thyromegaly  present.  Cardiovascular: Normal rate, regular rhythm, normal heart sounds and intact distal pulses.   Pulmonary/Chest: Effort normal and breath sounds normal.  Lymphadenopathy:    She has no cervical adenopathy.  Neurological: She is alert and oriented to person, place, and time.  Skin: Skin is warm and dry.  Psychiatric: She has a normal mood and affect. Her behavior is normal.   Diabetic Foot Exam - Simple   Simple Foot Form  Diabetic Foot exam was performed with the following findings:  Yes 11/14/2014 10:39 AM  Visual Inspection  No deformities, no ulcerations, no other skin breakdown bilaterally:  Yes  Sensation Testing  Intact to touch and monofilament testing bilaterally:  Yes  Pulse Check  Posterior Tibialis and Dorsalis pulse intact bilaterally:  Yes  Comments     Results for orders placed or performed in visit on 11/14/14  POCT glucose (manual entry)  Result Value Ref Range   POC Glucose 112 (A) 70 - 99 mg/dl  POCT glycosylated hemoglobin (Hb A1C)  Result Value Ref Range   Hemoglobin A1C 5.6            Assessment & Plan:   1. Diabetes mellitus without complication Excellent control without medication. Continue healthy eating and regular activity as she is able. - HM Diabetes Foot Exam - POCT glucose (manual entry) - POCT glycosylated hemoglobin (Hb A1C) - Microalbumin, urine - Comprehensive metabolic panel  2. Essential hypertension Uncontrolled. Increase amlodipine to 10 mg QD. Monitor for adverse effects. Bring BP diary to her next visit. - CBC with Differential/Platelet - amLODipine (NORVASC) 10 MG tablet; Take 1 tablet (10 mg total) by mouth daily.  Dispense: 90 tablet; Refill: 3  3. Constipation, unspecified constipation type Increase fluids and dietary fiber. OK to use Miralax.  4. Primary osteoarthritis of both knees Not ready for another injection yet.  - HYDROcodone-acetaminophen (NORCO) 7.5-325 MG per tablet; Take 1 tablet by mouth every 6  (six) hours as needed.  Dispense: 30 tablet; Refill: 0  5. Low back pain with right-sided sciatica, unspecified back pain laterality Stable. Intermittent. - HYDROcodone-acetaminophen (NORCO) 7.5-325 MG per tablet; Take 1 tablet by mouth every 6 (six) hours as needed.  Dispense: 30 tablet; Refill: 0  6. Hyperlipidemia Await lab results. - Lipid panel  7. Low bone mass Update DEXA. She'll call to schedule.  8. BMI 31.0-31.9,adult See above. Healthy lifestyle changes.  Return in about 3 months (around 02/14/2015).    Fara Chute, PA-C Physician Assistant-Certified [facility]

## 2014-11-14 NOTE — Patient Instructions (Addendum)
Please schedule your mammogram and bone density tests!  Check your blood pressure each day and record the results. Let me know if you have side effects with the higher dose.

## 2014-11-15 ENCOUNTER — Other Ambulatory Visit: Payer: Self-pay | Admitting: Physician Assistant

## 2014-11-16 ENCOUNTER — Encounter: Payer: Self-pay | Admitting: Physician Assistant

## 2014-11-22 ENCOUNTER — Encounter: Payer: Self-pay | Admitting: Physician Assistant

## 2014-11-29 DIAGNOSIS — C44310 Basal cell carcinoma of skin of unspecified parts of face: Secondary | ICD-10-CM

## 2014-11-29 HISTORY — DX: Basal cell carcinoma of skin of unspecified parts of face: C44.310

## 2014-12-14 DIAGNOSIS — C44319 Basal cell carcinoma of skin of other parts of face: Secondary | ICD-10-CM | POA: Diagnosis not present

## 2014-12-14 HISTORY — PX: BASAL CELL CARCINOMA EXCISION: SHX1214

## 2015-01-22 ENCOUNTER — Other Ambulatory Visit: Payer: Self-pay | Admitting: Emergency Medicine

## 2015-02-13 ENCOUNTER — Encounter: Payer: Self-pay | Admitting: Physician Assistant

## 2015-02-13 ENCOUNTER — Ambulatory Visit (INDEPENDENT_AMBULATORY_CARE_PROVIDER_SITE_OTHER): Payer: Medicare Other | Admitting: Physician Assistant

## 2015-02-13 VITALS — BP 137/85 | HR 77 | Temp 98.0°F | Resp 16 | Ht 64.0 in | Wt 178.2 lb

## 2015-02-13 DIAGNOSIS — E119 Type 2 diabetes mellitus without complications: Secondary | ICD-10-CM | POA: Diagnosis not present

## 2015-02-13 DIAGNOSIS — E785 Hyperlipidemia, unspecified: Secondary | ICD-10-CM | POA: Diagnosis not present

## 2015-02-13 DIAGNOSIS — M8580 Other specified disorders of bone density and structure, unspecified site: Secondary | ICD-10-CM

## 2015-02-13 DIAGNOSIS — I10 Essential (primary) hypertension: Secondary | ICD-10-CM

## 2015-02-13 LAB — LIPID PANEL
CHOL/HDL RATIO: 3.1 ratio (ref <=5.0)
Cholesterol: 156 mg/dL (ref 125–200)
HDL: 51 mg/dL (ref >=46)
LDL CALC: 76 mg/dL (ref <130)
TRIGLYCERIDES: 145 mg/dL (ref <150)
VLDL: 29 mg/dL (ref <30)

## 2015-02-13 LAB — COMPREHENSIVE METABOLIC PANEL
ALBUMIN: 4.2 g/dL (ref 3.6–5.1)
ALK PHOS: 58 U/L (ref 33–130)
ALT: 13 U/L (ref 6–29)
AST: 17 U/L (ref 10–35)
BUN: 30 mg/dL — ABNORMAL HIGH (ref 7–25)
CALCIUM: 9.8 mg/dL (ref 8.6–10.4)
CO2: 22 mmol/L (ref 20–31)
Chloride: 104 mmol/L (ref 98–110)
Creat: 0.83 mg/dL (ref 0.60–0.88)
GLUCOSE: 98 mg/dL (ref 65–99)
POTASSIUM: 4.2 mmol/L (ref 3.5–5.3)
Sodium: 136 mmol/L (ref 135–146)
Total Bilirubin: 0.4 mg/dL (ref 0.2–1.2)
Total Protein: 7.7 g/dL (ref 6.1–8.1)

## 2015-02-13 LAB — GLUCOSE, POCT (MANUAL RESULT ENTRY): POC GLUCOSE: 105 mg/dL — AB (ref 70–99)

## 2015-02-13 LAB — POCT GLYCOSYLATED HEMOGLOBIN (HGB A1C): Hemoglobin A1C: 6

## 2015-02-13 NOTE — Progress Notes (Signed)
Patient ID: Jessica [name], female    DOB: 1929/09/09, 81 y.o.   MRN: [id number]  PCP: Wynne Dust  Subjective:   Chief Complaint  Patient presents with  . Diabetes  . Hypertension  . Hyperlipidemia    HPI Presents for evaluation of Diabetes, HTN and hyperlipidemia. She brought a BP diary from home, noting that "it's always high when I come in here." Home log reveals all readings <140/90. Tolerating medications without difficulty. No new concerns. DM managed without metformin.   Review of Systems  Constitutional: Negative for activity change, appetite change, fatigue and unexpected weight change.  HENT: Negative for congestion, dental problem, ear pain, hearing loss, mouth sores, postnasal drip, rhinorrhea, sneezing, sore throat, tinnitus and trouble swallowing.   Eyes: Negative for photophobia, pain, redness and visual disturbance.  Respiratory: Negative for cough, chest tightness and shortness of breath.   Cardiovascular: Negative for chest pain, palpitations and leg swelling.  Gastrointestinal: Positive for constipation (baseline). Negative for nausea, vomiting, abdominal pain, diarrhea and blood in stool.  Endocrine: Negative for cold intolerance, heat intolerance, polydipsia, polyphagia and polyuria.  Genitourinary: Negative for dysuria, urgency, frequency and hematuria.  Musculoskeletal: Positive for back pain (baseline). Negative for myalgias, arthralgias, gait problem and neck stiffness.  Skin: Negative for rash.  Neurological: Negative for dizziness, speech difficulty, weakness, light-headedness, numbness and headaches.  Hematological: Negative for adenopathy.  Psychiatric/Behavioral: Negative for confusion and sleep disturbance. The patient is not nervous/anxious.        Patient Active Problem List   Diagnosis Date Noted  . BMI 31.0-31.9,adult 11/14/2014  . Lymphocytic colitis   . Overactive bladder   . Impaired hearing   . Glaucoma   . Cataracts,  bilateral   . Constipation   . Back pain   . GERD (gastroesophageal reflux disease)   . Cystocele   . Low bone mass   . Diabetes type 2, controlled   . Osteoarthritis   . COLONIC POLYPS, ADENOMATOUS 09/09/2007  . Hyperlipidemia 09/09/2007  . HTN (hypertension) 09/09/2007  . COLITIS 02/16/2007     Prior to Admission medications   Medication Sig Start Date End Date Taking? Authorizing Provider  amLODipine (NORVASC) 10 MG tablet Take 1 tablet (10 mg total) by mouth daily. 11/14/14  Yes Yao Hyppolite, PA-C  aspirin 81 MG tablet Take 81 mg by mouth daily.   Yes Historical Provider, MD  Biotin 1 MG CAPS Take by mouth.   Yes Historical Provider, MD  cycloSPORINE (RESTASIS) 0.05 % ophthalmic emulsion Place 1 drop into both eyes 2 (two) times daily.   Yes Historical Provider, MD  esomeprazole (NEXIUM) 20 MG capsule Take 20 mg by mouth daily at 12 noon.   Yes Historical Provider, MD  GLUCOSAMINE PO Take by mouth.   Yes Historical Provider, MD  HYDROcodone-acetaminophen (NORCO) 7.5-325 MG per tablet Take 1 tablet by mouth every 6 (six) hours as needed. 11/14/14  Yes Levern Kalka, PA-C  lisinopril-hydrochlorothiazide (PRINZIDE,ZESTORETIC) 20-12.5 MG per tablet TAKE 2 TABLETS BY MOUTH DAILY. 11/15/14  Yes Dafney Farler, PA-C  Multiple Vitamins-Minerals (MULTIVITAMIN WITH MINERALS) tablet Take 1 tablet by mouth daily.   Yes Historical Provider, MD  PATADAY 0.2 % SOLN  01/18/13  Yes Historical Provider, MD  pravastatin (PRAVACHOL) 20 MG tablet TAKE 1 TABLET (20 MG TOTAL) BY MOUTH EVERY EVENING. 01/23/15  Yes Darlyne Russian, MD     No Known Allergies     Objective:  Physical Exam  Constitutional: She is oriented to person, place, and  time. Vital signs are normal. She appears well-developed and well-nourished. She is active and cooperative. No distress.  BP 137/85 mmHg  Pulse 77  Temp(Src) 98 F (36.7 C) (Oral)  Resp 16  Ht 5\' 4"  (1.626 m)  Wt 178 lb 3.2 oz (80.831 kg)  BMI 30.57 kg/m2   SpO2 98%  HENT:  Head: Normocephalic and atraumatic.  Right Ear: Hearing normal.  Left Ear: Hearing normal.  Eyes: Conjunctivae are normal. No scleral icterus.  Neck: Normal range of motion. Neck supple. No thyromegaly present.  Cardiovascular: Normal rate, regular rhythm and normal heart sounds.   Pulses:      Radial pulses are 2+ on the right side, and 2+ on the left side.  Pulmonary/Chest: Effort normal and breath sounds normal.  Lymphadenopathy:       Head (right side): No tonsillar, no preauricular, no posterior auricular and no occipital adenopathy present.       Head (left side): No tonsillar, no preauricular, no posterior auricular and no occipital adenopathy present.    She has no cervical adenopathy.       Right: No supraclavicular adenopathy present.       Left: No supraclavicular adenopathy present.  Neurological: She is alert and oriented to person, place, and time. No sensory deficit.  Skin: Skin is warm, dry and intact. No rash noted. No cyanosis or erythema. Nails show no clubbing.  Psychiatric: She has a normal mood and affect. Her speech is normal and behavior is normal.   Results for orders placed or performed in visit on 02/13/15  POCT glucose (manual entry)  Result Value Ref Range   POC Glucose 105 (A) 70 - 99 mg/dl  POCT glycosylated hemoglobin (Hb A1C)  Result Value Ref Range   Hemoglobin A1C 6.0            Assessment & Plan:   1. Diabetes type 2, controlled Diet controlled. Continue healthy lifestyle. - POCT glucose (manual entry) - POCT glycosylated hemoglobin (Hb A1C) - Comprehensive metabolic panel  2. Essential hypertension Controlled at home. Continue amlodipine as before.  3. Hyperlipidemia Await labs. - Lipid panel  4. Low bone mass DEXA ordered. She will schedule at her convenience.  Return in about 3 months (around 05/16/2015).   Fara Chute, PA-C Physician Assistant-Certified [facility]

## 2015-02-13 NOTE — Patient Instructions (Addendum)
I will contact you with your lab results as soon as they are available.   If you have not heard from me in 2 weeks, please contact me.  The fastest way to get your results is to register for My Chart (see the instructions on the last page of this printout).  PLEASE schedule your bone density test!

## 2015-03-05 ENCOUNTER — Other Ambulatory Visit: Payer: Self-pay | Admitting: Physician Assistant

## 2015-05-15 DIAGNOSIS — M85852 Other specified disorders of bone density and structure, left thigh: Secondary | ICD-10-CM | POA: Diagnosis not present

## 2015-05-15 DIAGNOSIS — Z1231 Encounter for screening mammogram for malignant neoplasm of breast: Secondary | ICD-10-CM | POA: Diagnosis not present

## 2015-05-15 LAB — HM DEXA SCAN

## 2015-05-15 LAB — HM MAMMOGRAPHY: HM Mammogram: NEGATIVE

## 2015-05-22 ENCOUNTER — Encounter: Payer: Self-pay | Admitting: Physician Assistant

## 2015-05-22 ENCOUNTER — Ambulatory Visit (INDEPENDENT_AMBULATORY_CARE_PROVIDER_SITE_OTHER): Payer: Medicare Other | Admitting: Physician Assistant

## 2015-05-22 VITALS — HR 96 | Temp 98.0°F | Resp 16 | Ht 63.5 in | Wt 177.0 lb

## 2015-05-22 DIAGNOSIS — I10 Essential (primary) hypertension: Secondary | ICD-10-CM | POA: Diagnosis not present

## 2015-05-22 DIAGNOSIS — Z23 Encounter for immunization: Secondary | ICD-10-CM | POA: Diagnosis not present

## 2015-05-22 DIAGNOSIS — E119 Type 2 diabetes mellitus without complications: Secondary | ICD-10-CM | POA: Diagnosis not present

## 2015-05-22 DIAGNOSIS — E785 Hyperlipidemia, unspecified: Secondary | ICD-10-CM

## 2015-05-22 LAB — CBC WITH DIFFERENTIAL/PLATELET
BASOS ABS: 0.1 10*3/uL (ref 0.0–0.1)
Basophils Relative: 1 % (ref 0–1)
EOS ABS: 0.2 10*3/uL (ref 0.0–0.7)
EOS PCT: 2 % (ref 0–5)
HEMATOCRIT: 35.9 % — AB (ref 36.0–46.0)
Hemoglobin: 12.4 g/dL (ref 12.0–15.0)
LYMPHS ABS: 3.7 10*3/uL (ref 0.7–4.0)
Lymphocytes Relative: 42 % (ref 12–46)
MCH: 31.1 pg (ref 26.0–34.0)
MCHC: 34.5 g/dL (ref 30.0–36.0)
MCV: 90 fL (ref 78.0–100.0)
MONO ABS: 0.5 10*3/uL (ref 0.1–1.0)
MPV: 9.2 fL (ref 8.6–12.4)
Monocytes Relative: 6 % (ref 3–12)
Neutro Abs: 4.3 10*3/uL (ref 1.7–7.7)
Neutrophils Relative %: 49 % (ref 43–77)
PLATELETS: 320 10*3/uL (ref 150–400)
RBC: 3.99 MIL/uL (ref 3.87–5.11)
RDW: 13.7 % (ref 11.5–15.5)
WBC: 8.8 10*3/uL (ref 4.0–10.5)

## 2015-05-22 LAB — COMPREHENSIVE METABOLIC PANEL
ALK PHOS: 69 U/L (ref 33–130)
ALT: 13 U/L (ref 6–29)
AST: 18 U/L (ref 10–35)
Albumin: 4.5 g/dL (ref 3.6–5.1)
BUN: 26 mg/dL — AB (ref 7–25)
CO2: 22 mmol/L (ref 20–31)
Calcium: 9.8 mg/dL (ref 8.6–10.4)
Chloride: 103 mmol/L (ref 98–110)
Creat: 0.81 mg/dL (ref 0.60–0.88)
GLUCOSE: 99 mg/dL (ref 65–99)
POTASSIUM: 4.3 mmol/L (ref 3.5–5.3)
Sodium: 136 mmol/L (ref 135–146)
Total Bilirubin: 0.5 mg/dL (ref 0.2–1.2)
Total Protein: 8 g/dL (ref 6.1–8.1)

## 2015-05-22 LAB — POCT GLYCOSYLATED HEMOGLOBIN (HGB A1C): HEMOGLOBIN A1C: 5.9

## 2015-05-22 LAB — LIPID PANEL
CHOL/HDL RATIO: 2.6 ratio (ref <=5.0)
Cholesterol: 140 mg/dL (ref 125–200)
HDL: 54 mg/dL (ref >=46)
LDL Cholesterol: 55 mg/dL (ref <130)
Triglycerides: 157 mg/dL — ABNORMAL HIGH (ref <150)
VLDL: 31 mg/dL — AB (ref <30)

## 2015-05-22 LAB — GLUCOSE, POCT (MANUAL RESULT ENTRY): POC GLUCOSE: 111 mg/dL — AB (ref 70–99)

## 2015-05-22 LAB — HEMOGLOBIN A1C: Hgb A1c MFr Bld: 5.9 % (ref 4.0–6.0)

## 2015-05-22 LAB — TSH: TSH: 2.138 u[IU]/mL (ref 0.350–4.500)

## 2015-05-22 NOTE — Progress Notes (Signed)
Patient ID: Jessica [name], female    DOB: 17-Sep-1929, 81 y.o.   MRN: [id number]  PCP: Wynne Dust  Subjective:   Chief Complaint  Patient presents with  . Follow-up    3 mos  . Diabetes  . Hypertension    HPI Presents for evaluation of diabetes and HTN.  Overall, she's doing well. Tolerating medications without difficulty. Had mammogram and DEXA 05/15/2015 at [facility], but I have not yet received the reports. She brought a report showing the mammogram was normal.  RIGHT hip pain began yesterday. No injury recalled. This is the hip that is S/P THR. The pain is actually mid-upper thigh. "Feels like a knife sticking in me." Pain is with weight bearing, so she's using a cane. She has known DJD of the RIGHT knee, and has been told that she needs it replaced as well, but has been trying to delay that by getting steroid injections. She has some hydrocodone left over, but hasn't tried it yet.  Home BP log reviewed. 130's/60-87.  Occasional SBP 141.  Review of Systems  Constitutional: Negative for activity change, appetite change, fatigue and unexpected weight change.  HENT: Negative for congestion, dental problem, ear pain, hearing loss, mouth sores, postnasal drip, rhinorrhea, sneezing, sore throat, tinnitus and trouble swallowing.   Eyes: Negative for photophobia, pain, redness and visual disturbance.  Respiratory: Negative for cough, chest tightness and shortness of breath.   Cardiovascular: Negative for chest pain, palpitations and leg swelling.  Gastrointestinal: Negative for nausea, vomiting, abdominal pain, diarrhea, constipation and blood in stool.  Genitourinary: Negative for dysuria, urgency, frequency and hematuria.  Musculoskeletal: Positive for arthralgias. Negative for myalgias, gait problem and neck stiffness.  Skin: Negative for rash.  Neurological: Negative for dizziness, speech difficulty, weakness, light-headedness, numbness and headaches.  Hematological:  Negative for adenopathy.  Psychiatric/Behavioral: Negative for confusion and sleep disturbance. The patient is not nervous/anxious.        Patient Active Problem List   Diagnosis Date Noted  . BMI 31.0-31.9,adult 11/14/2014  . Lymphocytic colitis   . Overactive bladder   . Impaired hearing   . Glaucoma   . Cataracts, bilateral   . Constipation   . Back pain   . GERD (gastroesophageal reflux disease)   . Cystocele   . Low bone mass   . Diabetes type 2, controlled ([place])   . Osteoarthritis   . COLONIC POLYPS, ADENOMATOUS 09/09/2007  . Hyperlipidemia 09/09/2007  . HTN (hypertension) 09/09/2007  . COLITIS 02/16/2007     Prior to Admission medications   Medication Sig Start Date End Date Taking? Authorizing Provider  amLODipine (NORVASC) 10 MG tablet Take 1 tablet (10 mg total) by mouth daily. 11/14/14  Yes Erle Guster, PA-C  aspirin 81 MG tablet Take 81 mg by mouth daily.   Yes Historical Provider, MD  Biotin 1 MG CAPS Take by mouth.   Yes Historical Provider, MD  cycloSPORINE (RESTASIS) 0.05 % ophthalmic emulsion Place 1 drop into both eyes 2 (two) times daily.   Yes Historical Provider, MD  esomeprazole (NEXIUM) 20 MG capsule Take 20 mg by mouth daily at 12 noon.   Yes Historical Provider, MD  GLUCOSAMINE PO Take by mouth.   Yes Historical Provider, MD  HYDROcodone-acetaminophen (NORCO) 7.5-325 MG per tablet Take 1 tablet by mouth every 6 (six) hours as needed. 11/14/14  Yes Arelyn Gauer, PA-C  lisinopril-hydrochlorothiazide (PRINZIDE,ZESTORETIC) 20-12.5 MG per tablet TAKE 2 TABLETS BY MOUTH DAILY. 03/08/15  Yes [name], PA-C  Multiple  Vitamins-Minerals (MULTIVITAMIN WITH MINERALS) tablet Take 1 tablet by mouth daily.   Yes Historical Provider, MD  PATADAY 0.2 % SOLN  01/18/13  Yes Historical Provider, MD  pravastatin (PRAVACHOL) 20 MG tablet TAKE 1 TABLET (20 MG TOTAL) BY MOUTH EVERY EVENING. 01/23/15  Yes Darlyne Russian, MD     No Known Allergies     Objective:    Physical Exam  Constitutional: She is oriented to person, place, and time. Vital signs are normal. She appears well-developed and well-nourished. She is active and cooperative. No distress.  Pulse 96  Temp(Src) 98 F (36.7 C) (Oral)  Resp 16  Ht 5' 3.5" (1.613 m)  Wt 177 lb (80.287 kg)  BMI 30.86 kg/m2  SpO2 98%  HENT:  Head: Normocephalic and atraumatic.  Right Ear: Hearing normal.  Left Ear: Hearing normal.  Eyes: Conjunctivae are normal. No scleral icterus.  Neck: Normal range of motion. Neck supple. No thyromegaly present.  Cardiovascular: Normal rate, regular rhythm and normal heart sounds.   Pulses:      Radial pulses are 2+ on the right side, and 2+ on the left side.  Pulmonary/Chest: Effort normal and breath sounds normal.  Musculoskeletal:       Right hip: She exhibits tenderness and bony tenderness.       Legs: Lymphadenopathy:       Head (right side): No tonsillar, no preauricular, no posterior auricular and no occipital adenopathy present.       Head (left side): No tonsillar, no preauricular, no posterior auricular and no occipital adenopathy present.    She has no cervical adenopathy.       Right: No supraclavicular adenopathy present.       Left: No supraclavicular adenopathy present.  Neurological: She is alert and oriented to person, place, and time. No sensory deficit.  Skin: Skin is warm, dry and intact. No rash noted. No cyanosis or erythema. Nails show no clubbing.  Psychiatric: She has a normal mood and affect.           Assessment & Plan:   1. Controlled type 2 diabetes mellitus without complication, without long-term current use of insulin ([place]) Await labs. Adjust regimen if needed. - POCT glucose (manual entry) - POCT glycosylated hemoglobin (Hb A1C) - Comprehensive metabolic panel  2. Essential hypertension Generally controlled. Prefer 141/80's than hypotension and increased risk of falls. - CBC with Differential/Platelet - TSH  3.  Hyperlipidemia Await lab results. - Lipid panel  4. Flu vaccine need - Flu Vaccine QUAD 36+ mos IM   Fara Chute, PA-C Physician Assistant-Certified [facility]

## 2015-05-22 NOTE — Patient Instructions (Addendum)
I will contact you with your lab results as soon as they are available.   If you have not heard from me in 2 weeks, please contact me.  The fastest way to get your results is to register for My Chart (see the instructions on the last page of this printout).  Contact Dr. Debroah Loop office to schedule a visit for the hip pain. You can take the leftover pain medication if you need it.

## 2015-05-25 ENCOUNTER — Other Ambulatory Visit: Payer: Self-pay | Admitting: Physician Assistant

## 2015-05-28 ENCOUNTER — Encounter: Payer: Self-pay | Admitting: Physician Assistant

## 2015-05-29 ENCOUNTER — Encounter: Payer: Self-pay | Admitting: Family Medicine

## 2015-05-31 ENCOUNTER — Encounter: Payer: Self-pay | Admitting: Family Medicine

## 2015-06-04 ENCOUNTER — Encounter: Payer: Self-pay | Admitting: Family Medicine

## 2015-06-05 ENCOUNTER — Encounter: Payer: Self-pay | Admitting: Family Medicine

## 2015-08-22 ENCOUNTER — Other Ambulatory Visit: Payer: Self-pay | Admitting: Physician Assistant

## 2015-08-28 ENCOUNTER — Ambulatory Visit (INDEPENDENT_AMBULATORY_CARE_PROVIDER_SITE_OTHER): Payer: Medicare Other | Admitting: Physician Assistant

## 2015-08-28 ENCOUNTER — Encounter: Payer: Self-pay | Admitting: Physician Assistant

## 2015-08-28 VITALS — BP 162/73 | HR 91 | Temp 97.6°F | Resp 16 | Ht 64.0 in | Wt 179.0 lb

## 2015-08-28 DIAGNOSIS — E785 Hyperlipidemia, unspecified: Secondary | ICD-10-CM | POA: Diagnosis not present

## 2015-08-28 DIAGNOSIS — E119 Type 2 diabetes mellitus without complications: Secondary | ICD-10-CM | POA: Diagnosis not present

## 2015-08-28 DIAGNOSIS — I10 Essential (primary) hypertension: Secondary | ICD-10-CM

## 2015-08-28 DIAGNOSIS — D649 Anemia, unspecified: Secondary | ICD-10-CM

## 2015-08-28 LAB — COMPREHENSIVE METABOLIC PANEL
ALK PHOS: 67 U/L (ref 33–130)
ALT: 13 U/L (ref 6–29)
AST: 21 U/L (ref 10–35)
Albumin: 4.5 g/dL (ref 3.6–5.1)
BILIRUBIN TOTAL: 0.3 mg/dL (ref 0.2–1.2)
BUN: 28 mg/dL — AB (ref 7–25)
CALCIUM: 9.9 mg/dL (ref 8.6–10.4)
CO2: 22 mmol/L (ref 20–31)
CREATININE: 0.88 mg/dL (ref 0.60–0.88)
Chloride: 102 mmol/L (ref 98–110)
GLUCOSE: 102 mg/dL — AB (ref 65–99)
Potassium: 4.1 mmol/L (ref 3.5–5.3)
SODIUM: 136 mmol/L (ref 135–146)
Total Protein: 8.1 g/dL (ref 6.1–8.1)

## 2015-08-28 LAB — CBC WITH DIFFERENTIAL/PLATELET
BASOS ABS: 0 10*3/uL (ref 0.0–0.1)
BASOS PCT: 0 % (ref 0–1)
EOS ABS: 0.2 10*3/uL (ref 0.0–0.7)
EOS PCT: 2 % (ref 0–5)
HCT: 31.1 % — ABNORMAL LOW (ref 36.0–46.0)
Hemoglobin: 10 g/dL — ABNORMAL LOW (ref 12.0–15.0)
LYMPHS PCT: 38 % (ref 12–46)
Lymphs Abs: 3.4 10*3/uL (ref 0.7–4.0)
MCH: 25.6 pg — ABNORMAL LOW (ref 26.0–34.0)
MCHC: 32.2 g/dL (ref 30.0–36.0)
MCV: 79.5 fL (ref 78.0–100.0)
MONO ABS: 0.8 10*3/uL (ref 0.1–1.0)
MPV: 8.9 fL (ref 8.6–12.4)
Monocytes Relative: 9 % (ref 3–12)
Neutro Abs: 4.5 10*3/uL (ref 1.7–7.7)
Neutrophils Relative %: 51 % (ref 43–77)
PLATELETS: 344 10*3/uL (ref 150–400)
RBC: 3.91 MIL/uL (ref 3.87–5.11)
RDW: 15.4 % (ref 11.5–15.5)
WBC: 8.9 10*3/uL (ref 4.0–10.5)

## 2015-08-28 LAB — LIPID PANEL
Cholesterol: 168 mg/dL (ref 125–200)
HDL: 62 mg/dL (ref >=46)
LDL CALC: 71 mg/dL (ref <130)
Total CHOL/HDL Ratio: 2.7 Ratio (ref <=5.0)
Triglycerides: 175 mg/dL — ABNORMAL HIGH (ref <150)
VLDL: 35 mg/dL — ABNORMAL HIGH (ref <30)

## 2015-08-28 LAB — HEMOGLOBIN A1C
Hgb A1c MFr Bld: 6 % — ABNORMAL HIGH (ref <5.7)
Mean Plasma Glucose: 126 mg/dL — ABNORMAL HIGH (ref <117)

## 2015-08-28 MED ORDER — LISINOPRIL-HYDROCHLOROTHIAZIDE 20-25 MG PO TABS: 1.0000 | ORAL_TABLET | Freq: Every day | ORAL | Status: DC

## 2015-08-28 MED ORDER — AMLODIPINE BESYLATE 10 MG PO TABS: 10.0000 mg | ORAL_TABLET | Freq: Every day | ORAL | Status: DC

## 2015-08-28 NOTE — Patient Instructions (Signed)
Keep up the great work!

## 2015-08-28 NOTE — Progress Notes (Signed)
Patient ID: Jessica [name], female    DOB: 03-30-1930, 82 y.o.   MRN: [id number]  PCP: Wynne Dust  Subjective:   Chief Complaint  Patient presents with  . Follow-up  . Hypertension    HPI Presents for evaluation of HTN.  She is feeling good overall. No new concerns. Remains constipated, a lifelong issue. Uses a stool softener now.  Home BP log: 128-154/60-87, most readings 140-150/80's.  No CP, SOB, HA, dizziness. No nausea, vomiting or diarrhea. No new muscle or joint pains. No urinary urgency, frequency or dysuria.  Review of Systems As above.    Patient Active Problem List   Diagnosis Date Noted  . BMI 31.0-31.9,adult 11/14/2014  . Lymphocytic colitis   . Overactive bladder   . Impaired hearing   . Glaucoma   . Cataracts, bilateral   . Constipation   . Back pain   . GERD (gastroesophageal reflux disease)   . Cystocele   . Low bone mass   . Diabetes type 2, controlled ([place])   . Osteoarthritis   . COLONIC POLYPS, ADENOMATOUS 09/09/2007  . Hyperlipidemia 09/09/2007  . HTN (hypertension) 09/09/2007  . COLITIS 02/16/2007     Prior to Admission medications   Medication Sig Start Date End Date Taking? Authorizing Provider  amLODipine (NORVASC) 10 MG tablet Take 1 tablet (10 mg total) by mouth daily. 11/14/14  Yes Morganna Styles, PA-C  aspirin 81 MG tablet Take 81 mg by mouth daily.   Yes Historical Provider, MD  Biotin 1 MG CAPS Take by mouth.   Yes Historical Provider, MD  cycloSPORINE (RESTASIS) 0.05 % ophthalmic emulsion Place 1 drop into both eyes 2 (two) times daily.   Yes Historical Provider, MD  esomeprazole (NEXIUM) 20 MG capsule Take 20 mg by mouth daily at 12 noon.   Yes Historical Provider, MD  GLUCOSAMINE PO Take by mouth.   Yes Historical Provider, MD  HYDROcodone-acetaminophen (NORCO) 7.5-325 MG per tablet Take 1 tablet by mouth every 6 (six) hours as needed. 11/14/14  Yes Asya Derryberry, PA-C  lisinopril-hydrochlorothiazide  (PRINZIDE,ZESTORETIC) 20-12.5 MG tablet TAKE 2 TABLETS BY MOUTH DAILY "OFFICE VISIT NEEDED FOR REFILLS" 08/22/15  Yes Marirose Deveney, PA-C  Multiple Vitamins-Minerals (MULTIVITAMIN WITH MINERALS) tablet Take 1 tablet by mouth daily.   Yes Historical Provider, MD  PATADAY 0.2 % SOLN  01/18/13  Yes Historical Provider, MD  pravastatin (PRAVACHOL) 20 MG tablet TAKE 1 TABLET (20 MG TOTAL) BY MOUTH EVERY EVENING. 01/23/15  Yes Darlyne Russian, MD     No Known Allergies     Objective:  Physical Exam  Constitutional: She is oriented to person, place, and time. She appears well-developed and well-nourished. She is active and cooperative. No distress.  BP 162/73 mmHg  Pulse 91  Temp(Src) 97.6 F (36.4 C)  Resp 16  Ht 5\' 4"  (1.626 m)  Wt 179 lb (81.194 kg)  BMI 30.71 kg/m2   Eyes: Conjunctivae are normal. No scleral icterus.  Neck: No thyromegaly present.  Cardiovascular: Normal rate, regular rhythm, normal heart sounds and intact distal pulses.   Pulmonary/Chest: Effort normal and breath sounds normal.  Lymphadenopathy:    She has no cervical adenopathy.  Neurological: She is alert and oriented to person, place, and time.  Skin: Skin is warm and dry.  Psychiatric: She has a normal mood and affect. Her speech is normal and behavior is normal.           Assessment & Plan:   1. Essential hypertension Uncontrolled  here, and still running a little high at home. Increase HCTZ component to 25 mg. - CBC with Differential/Platelet - Comprehensive metabolic panel - lisinopril-hydrochlorothiazide (PRINZIDE,ZESTORETIC) 20-25 MG tablet; Take 1 tablet by mouth daily.  Dispense: 90 tablet; Refill: 3 - amLODipine (NORVASC) 10 MG tablet; Take 1 tablet (10 mg total) by mouth daily.  Dispense: 90 tablet; Refill: 3  2. Controlled type 2 diabetes mellitus without complication, without long-term current use of insulin ([place]) Await labs. Continue healthy lifestyle modifications. - Comprehensive metabolic  panel - Hemoglobin A1c  3. Hyperlipidemia Await labs. - Comprehensive metabolic panel - Lipid panel    Return in about 6 months (around 02/25/2016).    Fara Chute, PA-C Physician Assistant-Certified [facility]

## 2015-08-29 MED ORDER — LISINOPRIL 20 MG PO TABS: 20.0000 mg | ORAL_TABLET | Freq: Every day | ORAL | Status: DC

## 2015-08-29 MED ORDER — METOPROLOL SUCCINATE ER 25 MG PO TB24: 25.0000 mg | ORAL_TABLET | Freq: Every day | ORAL | Status: DC

## 2015-08-31 ENCOUNTER — Encounter: Payer: Self-pay | Admitting: Physician Assistant

## 2015-08-31 NOTE — Addendum Note (Signed)
Addended by: Fara Chute on: 08/31/2015 01:43 PM   Modules accepted: Orders, SmartSet

## 2015-09-06 ENCOUNTER — Encounter: Payer: Self-pay | Admitting: Internal Medicine

## 2015-10-05 ENCOUNTER — Encounter: Payer: Self-pay | Admitting: Physician Assistant

## 2015-10-15 ENCOUNTER — Encounter: Payer: Self-pay | Admitting: Physician Assistant

## 2015-10-30 ENCOUNTER — Other Ambulatory Visit (INDEPENDENT_AMBULATORY_CARE_PROVIDER_SITE_OTHER): Payer: Medicare Other

## 2015-10-30 ENCOUNTER — Encounter: Payer: Self-pay | Admitting: Internal Medicine

## 2015-10-30 ENCOUNTER — Ambulatory Visit (INDEPENDENT_AMBULATORY_CARE_PROVIDER_SITE_OTHER): Payer: Medicare Other | Admitting: Internal Medicine

## 2015-10-30 VITALS — BP 138/64 | HR 88 | Ht 63.5 in | Wt 180.6 lb

## 2015-10-30 DIAGNOSIS — Z86010 Personal history of colonic polyps: Secondary | ICD-10-CM | POA: Diagnosis not present

## 2015-10-30 DIAGNOSIS — D509 Iron deficiency anemia, unspecified: Secondary | ICD-10-CM | POA: Diagnosis not present

## 2015-10-30 LAB — CBC WITH DIFFERENTIAL/PLATELET
BASOS ABS: 0.1 10*3/uL (ref 0.0–0.1)
Basophils Relative: 0.6 % (ref 0.0–3.0)
EOS ABS: 0.2 10*3/uL (ref 0.0–0.7)
Eosinophils Relative: 1.6 % (ref 0.0–5.0)
HEMATOCRIT: 32.1 % — AB (ref 36.0–46.0)
HEMOGLOBIN: 10.7 g/dL — AB (ref 12.0–15.0)
LYMPHS PCT: 34.1 % (ref 12.0–46.0)
Lymphs Abs: 3.8 10*3/uL (ref 0.7–4.0)
MCHC: 33.3 g/dL (ref 30.0–36.0)
MCV: 78.3 fl (ref 78.0–100.0)
MONO ABS: 1 10*3/uL (ref 0.1–1.0)
Monocytes Relative: 8.9 % (ref 3.0–12.0)
NEUTROS ABS: 6 10*3/uL (ref 1.4–7.7)
Neutrophils Relative %: 54.8 % (ref 43.0–77.0)
PLATELETS: 311 10*3/uL (ref 150.0–400.0)
RBC: 4.09 Mil/uL (ref 3.87–5.11)
RDW: 19.6 % — ABNORMAL HIGH (ref 11.5–15.5)
WBC: 11 10*3/uL — AB (ref 4.0–10.5)

## 2015-10-30 LAB — FERRITIN: FERRITIN: 10 ng/mL (ref 10.0–291.0)

## 2015-10-30 LAB — VITAMIN B12: Vitamin B-12: 1160 pg/mL — ABNORMAL HIGH (ref 211–911)

## 2015-10-30 MED ORDER — POLYETHYLENE GLYCOL 3350 17 GM/SCOOP PO POWD: 1.0000 | Freq: Once | ORAL | Status: DC

## 2015-10-30 MED ORDER — BISACODYL 5 MG PO TBEC: DELAYED_RELEASE_TABLET | ORAL | Status: DC

## 2015-10-30 NOTE — Patient Instructions (Addendum)
  You have been scheduled for an endoscopy and colonoscopy. Please follow the written instructions given to you at your visit today. Please pick up your prep supplies at the pharmacy. If you use inhalers (even only as needed), please bring them with you on the day of your procedure.  Your physician has requested that you go to the basement for the lab work before leaving today.   I appreciate the opportunity to care for you.

## 2015-10-30 NOTE — Progress Notes (Signed)
Referred by Daphane Shepherd PA-C Subjective:    Patient ID: Jessica [name], female    DOB: 10/30/1929, 82 y.o.   MRN: [id number] Cc: anemia HPI This is a very nice widowed ww with a new microcytic anemia - and chronic constipation. Hgb 12.4 MCV 90 then down to 10 and 79.5 (05/22/15 - 08/28/15). She denies specific sxs but says "I am just lazy". No melena, rectal bleeding, change in bowels, dysphagia, abdominal pain. Does have chronic constipation treated by metamucil and stool softener.  She does eat green leafy vegetables but reare red meat, does not donate blood.  Colonoscopy 2008 - diarrhea - bx lymphocytic colitis and 2 adenomas, was tx successfully w/ budesonide No Known Allergies Outpatient Prescriptions Prior to Visit  Medication Sig Dispense Refill  . amLODipine (NORVASC) 10 MG tablet Take 1 tablet (10 mg total) by mouth daily. 90 tablet 3  . aspirin 81 MG tablet Take 81 mg by mouth daily.    . Biotin 1 MG CAPS Take by mouth.    . cycloSPORINE (RESTASIS) 0.05 % ophthalmic emulsion Place 1 drop into both eyes 2 (two) times daily.    Marland Kitchen esomeprazole (NEXIUM) 20 MG capsule Take 20 mg by mouth daily at 12 noon.    Marland Kitchen GLUCOSAMINE PO Take by mouth.    Marland Kitchen HYDROcodone-acetaminophen (NORCO) 7.5-325 MG per tablet Take 1 tablet by mouth every 6 (six) hours as needed. 30 tablet 0  . lisinopril (PRINIVIL,ZESTRIL) 20 MG tablet Take 1 tablet (20 mg total) by mouth daily. 90 tablet 0  . lisinopril-hydrochlorothiazide (PRINZIDE,ZESTORETIC) 20-25 MG tablet Take 1 tablet by mouth daily. 90 tablet 3  . metoprolol succinate (TOPROL-XL) 25 MG 24 hr tablet Take 1 tablet (25 mg total) by mouth daily. 90 tablet 3  . Multiple Vitamins-Minerals (MULTIVITAMIN WITH MINERALS) tablet Take 1 tablet by mouth daily.    Marland Kitchen PATADAY 0.2 % SOLN     . pravastatin (PRAVACHOL) 20 MG tablet TAKE 1 TABLET (20 MG TOTAL) BY MOUTH EVERY EVENING. 90 tablet 1   No facility-administered medications prior to visit.   Past Medical  History  Diagnosis Date  . Lymphocytic colitis   . Pain, lumbar region     partially herniated disc, s/p steroid injection 2007  . Cystocele   . Low bone mass 02/2008  . Type II or unspecified type diabetes mellitus without mention of complication, not stated as uncontrolled 08/2010    Diet controlled  . Essential hypertension, benign     takes Amlodipine and Prinizide daily  . Hyperlipidemia     takes Pravastatin daily  . GERD (gastroesophageal reflux disease)     takes Nexium daily  . Hay fever   . Osteoarthritis     hip, knee; severe  . Back pain     herniated disc  . Constipation   . Colon polyps   . Urinary frequency   . Incontinence     occasionally  . Urinary urgency   . Cataracts, bilateral   . Glaucoma   . Impaired hearing   . Postoperative anemia due to acute blood loss 09/26/2011  . Basal cell carcinoma of face 11/2014    LEFT Georgie Chard   Past Surgical History  Procedure Laterality Date  . Toe surgery      bilateral  . Colonoscopy    . Tonsillectomy and adenoidectomy    . Total hip arthroplasty  09/24/2011    Procedure: TOTAL HIP ARTHROPLASTY;  Surgeon: Ninetta Lights, MD;  Location: [facility];  Service: Orthopedics;  Laterality: Right;  . Cataract surgery 04/25/13; 05/16/2013    . Basal cell carcinoma excision Left 12/14/2014    LEFT temple   Social History   Social History  . Marital Status: Widowed    Spouse Name: n/a  . Number of Children: 2  . Years of Education: 11th grade   Occupational History  . Retired Glass blower/designer    Social History Main Topics  . Smoking status: Former Smoker    Types: Cigarettes    Quit date: 07/07/1995  . Smokeless tobacco: Never Used  . Alcohol Use: No  . Drug Use: No  . Sexual Activity: No   Other Topics Concern  . None   Social History Narrative   Widowed - retired Hospital doctor   Daughter (a retired Forensic psychologist) lives with patient - she has medical problems related to obesity.    Her son and his  family live across the street.   2 caffeine drinks/day   10/31/2015      Family History  Problem Relation Age of Onset  . Stroke Mother 72  . Hypertension Mother   . Anesthesia problems Neg Hx   . Hypotension Neg Hx   . Pseudochol deficiency Neg Hx   . Malignant hyperthermia Neg Hx   . Heart disease Father 2    Endocarditis  . Rheumatic fever Father   . Obesity Daughter     unable to walk very far  . Arthritis Daughter     Review of Systems As per HPI + arthritis pain, decreased hearing, urine leakage All other ROS negative    Objective:   Physical Exam @BP  138/64 mmHg  Pulse 88  Ht 5' 3.5" (1.613 m)  Wt 180 lb 9.6 oz (81.92 kg)  BMI 31.49 kg/m2@  General:  Well-developed, well-nourished and in no acute distress Eyes:  anicteric. ENT:   Mouth and posterior pharynx free of lesions.  Neck:   supple w/o thyromegaly or mass.  Lungs: Clear to auscultation bilaterally. Heart:  S1S2, no rubs, murmurs, gallops. Abdomen:  soft, non-tender, no hepatosplenomegaly, hernia, or mass and BS+.  Rectal: Deferred until colonoscopy Lymph:  no cervical or supraclavicular adenopathy. Extremities:   no edema, cyanosis or clubbing Skin   no rash. Pale palmar creases Neuro:  A&O x 3.  Psych:  appropriate mood and  Affect.  Data Reviewed: As per HPI PCP notes 2017    Assessment & Plan:   Encounter Diagnoses  Name Primary?  . Microcytic anemia Yes  . Hx of adenomatous colonic polyps    Ferritin, CBC, B12 Colonoscopy (first) EGD (second)  Need to consider celiac disease given hx lymphocytic colitis. Can do duodenal bxs.  The risks and benefits as well as alternatives of endoscopic procedure(s) have been discussed and reviewed. All questions answered. The patient agrees to proceed.  Lab Results  Component Value Date   WBC 11.0* 10/30/2015   HGB 10.7* 10/30/2015   HCT 32.1* 10/30/2015   MCV 78.3 10/30/2015   PLT 311.0 10/30/2015   Lab Results  Component Value Date    FERRITIN 10.0 10/30/2015   Lab Results  Component Value Date   VITAMINB12 1160* 10/30/2015     I appreciate the opportunity to care for this patient. [id number], PA-C

## 2015-10-31 ENCOUNTER — Encounter: Payer: Self-pay | Admitting: Internal Medicine

## 2015-11-02 NOTE — Progress Notes (Signed)
Quick Note:  Iron is low B12 ok Hemoglobin stable My Chart message sent ______

## 2015-11-06 ENCOUNTER — Encounter: Payer: Self-pay | Admitting: Internal Medicine

## 2015-11-12 ENCOUNTER — Encounter: Payer: Self-pay | Admitting: Physician Assistant

## 2015-11-13 MED ORDER — LISINOPRIL-HYDROCHLOROTHIAZIDE 20-12.5 MG PO TABS: 2.0000 | ORAL_TABLET | Freq: Every day | ORAL | Status: DC

## 2015-11-20 ENCOUNTER — Ambulatory Visit (AMBULATORY_SURGERY_CENTER): Payer: Medicare Other | Admitting: Internal Medicine

## 2015-11-20 ENCOUNTER — Encounter: Payer: Self-pay | Admitting: Internal Medicine

## 2015-11-20 VITALS — BP 138/66 | HR 69 | Temp 98.4°F | Resp 14 | Ht 63.0 in | Wt 180.0 lb

## 2015-11-20 DIAGNOSIS — K257 Chronic gastric ulcer without hemorrhage or perforation: Secondary | ICD-10-CM

## 2015-11-20 DIAGNOSIS — K449 Diaphragmatic hernia without obstruction or gangrene: Secondary | ICD-10-CM | POA: Diagnosis not present

## 2015-11-20 DIAGNOSIS — Z860100 Personal history of colon polyps, unspecified: Secondary | ICD-10-CM

## 2015-11-20 DIAGNOSIS — D122 Benign neoplasm of ascending colon: Secondary | ICD-10-CM | POA: Diagnosis not present

## 2015-11-20 DIAGNOSIS — D509 Iron deficiency anemia, unspecified: Secondary | ICD-10-CM | POA: Diagnosis not present

## 2015-11-20 DIAGNOSIS — K3189 Other diseases of stomach and duodenum: Secondary | ICD-10-CM | POA: Diagnosis not present

## 2015-11-20 DIAGNOSIS — D649 Anemia, unspecified: Secondary | ICD-10-CM | POA: Diagnosis not present

## 2015-11-20 DIAGNOSIS — D124 Benign neoplasm of descending colon: Secondary | ICD-10-CM

## 2015-11-20 MED ORDER — FERROUS SULFATE 325 (65 FE) MG PO TABS: 325.0000 mg | ORAL_TABLET | Freq: Two times a day (BID) | ORAL | Status: DC

## 2015-11-20 MED ORDER — SODIUM CHLORIDE 0.9 % IV SOLN: 500.0000 mL | INTRAVENOUS | Status: DC

## 2015-11-20 NOTE — Progress Notes (Signed)
Patient awakening,vss,report to rn 

## 2015-11-20 NOTE — Op Note (Signed)
Jessica [name] Procedure Date: 11/20/2015 2:32 PM MRN: [id number] Endoscopist: Gatha Mayer , MD Age: 82 Referring MD:  Date of Birth: Mar 01, 1930 Gender: Female Procedure:                Upper GI endoscopy Indications:              Iron deficiency anemia Medicines:                Propofol per Anesthesia, Monitored Anesthesia Care Procedure:                Pre-Anesthesia Assessment:                           - Prior to the procedure, a History and Physical                            was performed, and patient medications and                            allergies were reviewed. The patient's tolerance of                            previous anesthesia was also reviewed. The risks                            and benefits of the procedure and the sedation                            options and risks were discussed with the patient.                            All questions were answered, and informed consent                            was obtained. Prior Anticoagulants: The patient                            last took aspirin 1 day prior to the procedure. ASA                            Grade Assessment: III - A patient with severe                            systemic disease. After reviewing the risks and                            benefits, the patient was deemed in satisfactory                            condition to undergo the procedure.                           After obtaining informed consent, the endoscope was  passed under direct vision. Throughout the                            procedure, the patient's blood pressure, pulse, and                            oxygen saturations were monitored continuously. The                            Model GIF-[id number]) scope was introduced                            through the mouth, and advanced to the second part                            of duodenum. The upper GI endoscopy  was                            accomplished without difficulty. The patient                            tolerated the procedure well. Scope In: Scope Out: Findings:                 A 10 cm hiatal hernia with multiple Cameron ulcers                            was found. The hiatal narrowing was 40 cm from the                            incisors. The Z-line was 30 cm from the incisors.                            Biopsies were taken with a cold forceps for                            histology. Verification of patient identification                            for the specimen was done. Estimated blood loss was                            minimal.                           A few localized, small non-bleeding erosions were                            found in the prepyloric region of the stomach.                            There were no stigmata of recent bleeding. Biopsies  were taken with a cold forceps for histology.                            Verification of patient identification for the                            specimen was done. Estimated blood loss was minimal.                           The examined esophagus was tortuous.                           Diffuse atrophic mucosa was found in the duodenal                            bulb and in the second portion of the duodenum.                            Biopsies for histology were taken with a cold                            forceps for evaluation of celiac disease.                            Verification of patient identification for the                            specimen was done. Estimated blood loss was minimal.                           The exam was otherwise without abnormality.                           retroflexion in stomach performed Complications:            No immediate complications. Estimated Blood Loss:     Estimated blood loss was minimal. Impression:               - 10 cm hiatal hernia with multiple  Cameron ulcers.                            Biopsied.                           - Non-bleeding erosive gastropathy. Biopsied.                           - Tortuous esophagus.                           - Duodenal mucosal atrophy. Biopsied.                           - The examination was otherwise normal. Recommendation:           - Patient has a contact number available for  emergencies. The signs and symptoms of potential                            delayed complications were discussed with the                            patient. Return to normal activities tomorrow.                            Written discharge instructions were provided to the                            patient.                           - Resume previous diet.                           - Continue present medications.                           - Await pathology results.                           - See the other procedure note for documentation of                            additional recommendations. Gatha Mayer, MD 11/20/2015 3:28:45 PM This report has been signed electronically.

## 2015-11-20 NOTE — Patient Instructions (Addendum)
I found a large hiatal hernia (stomach is partially in chest) and little ulcers that are related "Cameron's ulcers" and these have been leaking blood and causing the anemia.  I also took intestine biopsies to see if you have gluten allergy (Celiac disease)  Two benign-appearing colon polyps removed.   I will let you know pathology results.  Please take ferrous sulfate twice a day and see Chelle in 2 or 3 months foloow-up anemia.  I appreciate the opportunity to care for you. Gatha Mayer, MD, [facility]   Discharge instructions given. Handouts on polyps and diverticulosis. Resume previous medications. YOU HAD AN ENDOSCOPIC PROCEDURE TODAY AT [facility]:   Refer to the procedure report that was given to you for any specific questions about what was found during the examination.  If the procedure report does not answer your questions, please call your gastroenterologist to clarify.  If you requested that your care partner not be given the details of your procedure findings, then the procedure report has been included in a sealed envelope for you to review at your convenience later.  YOU SHOULD EXPECT: Some feelings of bloating in the abdomen. Passage of more gas than usual.  Walking can help get rid of the air that was put into your GI tract during the procedure and reduce the bloating. If you had a lower endoscopy (such as a colonoscopy or flexible sigmoidoscopy) you may notice spotting of blood in your stool or on the toilet paper. If you underwent a bowel prep for your procedure, you may not have a normal bowel movement for a few days.  Please Note:  You might notice some irritation and congestion in your nose or some drainage.  This is from the oxygen used during your procedure.  There is no need for concern and it should clear up in a day or so.  SYMPTOMS TO REPORT IMMEDIATELY:   Following lower endoscopy (colonoscopy or flexible sigmoidoscopy):  Excessive  amounts of blood in the stool  Significant tenderness or worsening of abdominal pains  Swelling of the abdomen that is new, acute  Fever of 100F or higher   Following upper endoscopy (EGD)  Vomiting of blood or coffee ground material  New chest pain or pain under the shoulder blades  Painful or persistently difficult swallowing  New shortness of breath  Fever of 100F or higher  Black, tarry-looking stools  For urgent or emergent issues, a gastroenterologist can be reached at any hour by calling [phone].   DIET: Your first meal following the procedure should be a small meal and then it is ok to progress to your normal diet. Heavy or fried foods are harder to digest and may make you feel nauseous or bloated.  Likewise, meals heavy in dairy and vegetables can increase bloating.  Drink plenty of fluids but you should avoid alcoholic beverages for 24 hours.  ACTIVITY:  You should plan to take it easy for the rest of today and you should NOT DRIVE or use heavy machinery until tomorrow (because of the sedation medicines used during the test).    FOLLOW UP: Our staff will call the number listed on your records the next business day following your procedure to check on you and address any questions or concerns that you may have regarding the information given to you following your procedure. If we do not reach you, we will leave a message.  However, if you are feeling well and you are not  experiencing any problems, there is no need to return our call.  We will assume that you have returned to your regular daily activities without incident.  If any biopsies were taken you will be contacted by phone or by letter within the next 1-3 weeks.  Please call us at [phone] if you have not heard about the biopsies in 3 weeks.    SIGNATURES/CONFIDENTIALITY: You and/or your care partner have signed paperwork which will be entered into your electronic medical record.  These signatures attest to  the fact that that the information above on your After Visit Summary has been reviewed and is understood.  Full responsibility of the confidentiality of this discharge information lies with you and/or your care-partner.

## 2015-11-20 NOTE — Progress Notes (Signed)
Called to room to assist during endoscopic procedure.  Patient ID and intended procedure confirmed with present staff. Received instructions for my participation in the procedure from the performing physician.  

## 2015-11-20 NOTE — Op Note (Signed)
[facility] Patient Name: Jessica [name] Procedure Date: 11/20/2015 2:32 PM MRN: [id number] Endoscopist: Gatha Mayer , MD Age: 82 Referring MD:  Date of Birth: 1930-06-16 Gender: Female Procedure:                Colonoscopy Indications:              Unexplained iron deficiency anemia Medicines:                Propofol per Anesthesia, Monitored Anesthesia Care Procedure:                Pre-Anesthesia Assessment:                           - Prior to the procedure, a History and Physical                            was performed, and patient medications and                            allergies were reviewed. The patient's tolerance of                            previous anesthesia was also reviewed. The risks                            and benefits of the procedure and the sedation                            options and risks were discussed with the patient.                            All questions were answered, and informed consent                            was obtained. Prior Anticoagulants: The patient                            last took aspirin 1 day prior to the procedure. ASA                            Grade Assessment: III - A patient with severe                            systemic disease. After reviewing the risks and                            benefits, the patient was deemed in satisfactory                            condition to undergo the procedure.                           - Prior to the procedure, a History and Physical  was performed, and patient medications and                            allergies were reviewed. The patient's tolerance of                            previous anesthesia was also reviewed. The risks                            and benefits of the procedure and the sedation                            options and risks were discussed with the patient.                            All questions were answered, and  informed consent                            was obtained. Prior Anticoagulants: The patient                            last took aspirin 1 day prior to the procedure. ASA                            Grade Assessment: III - A patient with severe                            systemic disease. After reviewing the risks and                            benefits, the patient was deemed in satisfactory                            condition to undergo the procedure.                           After obtaining informed consent, the colonoscope                            was passed under direct vision. Throughout the                            procedure, the patient's blood pressure, pulse, and                            oxygen saturations were monitored continuously. The                            Model CF-HQ190L [id number]) scope was introduced                            through the anus and advanced to the the cecum,  identified by appendiceal orifice and ileocecal                            valve. The colonoscopy was performed without                            difficulty. The patient tolerated the procedure                            well. The quality of the bowel preparation was                            good. The bowel preparation used was Miralax. The                            ileocecal valve, appendiceal orifice, and rectum                            were photographed. Scope In: 2:54:02 PM Scope Out: 3:09:51 PM Scope Withdrawal Time: 0 hours 9 minutes 28 seconds  Total Procedure Duration: 0 hours 15 minutes 49 seconds  Findings:                 The perianal and digital rectal examinations were                            normal.                           Two sessile polyps were found in the descending                            colon and ascending colon. The polyps were 7 to 10                            mm in size. These polyps were removed with a cold                             snare. Resection and retrieval were complete.                            Verification of patient identification for the                            specimen was done. Estimated blood loss was minimal.                           Multiple diverticula were found in the sigmoid                            colon. There was no evidence of diverticular                            bleeding.  The exam was otherwise without abnormality on                            direct and retroflexion views. Complications:            No immediate complications. Estimated Blood Loss:     Estimated blood loss was minimal. Impression:               - Two 7 to 10 mm polyps in the descending colon and                            in the ascending colon, removed with a cold snare.                            Resected and retrieved.                           - Diverticulosis in the sigmoid colon. There was no                            evidence of diverticular bleeding.                           - The examination was otherwise normal on direct                            and retroflexion views. Recommendation:           - Patient has a contact number available for                            emergencies. The signs and symptoms of potential                            delayed complications were discussed with the                            patient. Return to normal activities tomorrow.                            Written discharge instructions were provided to the                            patient.                           - Resume previous diet.                           - Continue present medications.                           - No repeat colonoscopy due to age. Gatha Mayer, MD 11/20/2015 3:31:36 PM This report has been signed electronically.

## 2015-11-21 ENCOUNTER — Telehealth: Payer: Self-pay

## 2015-11-21 NOTE — Telephone Encounter (Signed)
  Follow up Call-  Call back number 11/20/2015  Post procedure Call Back phone  # [phone] hm  Permission to leave phone message Yes     Patient questions:  Do you have a fever, pain , or abdominal swelling? No. Pain Score  0 *  Have you tolerated food without any problems? Yes.    Have you been able to return to your normal activities? Yes.    Do you have any questions about your discharge instructions: Diet   No. Medications  No. Follow up visit  No.  Do you have questions or concerns about your Care? No.  Actions: * If pain score is 4 or above: No action needed, pain <4.

## 2015-11-24 ENCOUNTER — Other Ambulatory Visit: Payer: Self-pay | Admitting: Physician Assistant

## 2015-11-27 ENCOUNTER — Encounter: Payer: Self-pay | Admitting: Internal Medicine

## 2015-12-03 NOTE — Progress Notes (Signed)
Quick Note:  Let her know polyps and biopises benign Needs to see me again in 2 months approx CBC and ferritin few days before appt Stay on iron supplements  No letter or recall from [facility]  ______

## 2015-12-04 ENCOUNTER — Other Ambulatory Visit: Payer: Self-pay

## 2015-12-04 DIAGNOSIS — D509 Iron deficiency anemia, unspecified: Secondary | ICD-10-CM

## 2015-12-15 ENCOUNTER — Other Ambulatory Visit: Payer: Self-pay | Admitting: Emergency Medicine

## 2016-02-12 ENCOUNTER — Other Ambulatory Visit (INDEPENDENT_AMBULATORY_CARE_PROVIDER_SITE_OTHER): Payer: Medicare Other

## 2016-02-12 DIAGNOSIS — D509 Iron deficiency anemia, unspecified: Secondary | ICD-10-CM | POA: Diagnosis not present

## 2016-02-12 LAB — FERRITIN: FERRITIN: 32.2 ng/mL (ref 10.0–291.0)

## 2016-02-12 LAB — CBC WITH DIFFERENTIAL/PLATELET
BASOS ABS: 0.1 10*3/uL (ref 0.0–0.1)
Basophils Relative: 0.6 % (ref 0.0–3.0)
EOS ABS: 0.2 10*3/uL (ref 0.0–0.7)
Eosinophils Relative: 1.8 % (ref 0.0–5.0)
HCT: 37.1 % (ref 36.0–46.0)
Hemoglobin: 12.4 g/dL (ref 12.0–15.0)
LYMPHS ABS: 4.6 10*3/uL — AB (ref 0.7–4.0)
LYMPHS PCT: 41.7 % (ref 12.0–46.0)
MCHC: 33.6 g/dL (ref 30.0–36.0)
MCV: 87.4 fl (ref 78.0–100.0)
MONO ABS: 1 10*3/uL (ref 0.1–1.0)
Monocytes Relative: 9.5 % (ref 3.0–12.0)
NEUTROS ABS: 5.1 10*3/uL (ref 1.4–7.7)
NEUTROS PCT: 46.4 % (ref 43.0–77.0)
PLATELETS: 274 10*3/uL (ref 150.0–400.0)
RBC: 4.24 Mil/uL (ref 3.87–5.11)
RDW: 17.6 % — ABNORMAL HIGH (ref 11.5–15.5)
WBC: 10.9 10*3/uL — ABNORMAL HIGH (ref 4.0–10.5)

## 2016-02-14 ENCOUNTER — Ambulatory Visit (INDEPENDENT_AMBULATORY_CARE_PROVIDER_SITE_OTHER): Payer: Medicare Other | Admitting: Internal Medicine

## 2016-02-14 ENCOUNTER — Encounter: Payer: Self-pay | Admitting: Internal Medicine

## 2016-02-14 VITALS — BP 120/74 | HR 74 | Ht 63.0 in | Wt 181.0 lb

## 2016-02-14 DIAGNOSIS — D500 Iron deficiency anemia secondary to blood loss (chronic): Secondary | ICD-10-CM | POA: Diagnosis not present

## 2016-02-14 DIAGNOSIS — K449 Diaphragmatic hernia without obstruction or gangrene: Secondary | ICD-10-CM | POA: Diagnosis not present

## 2016-02-14 DIAGNOSIS — K257 Chronic gastric ulcer without hemorrhage or perforation: Secondary | ICD-10-CM

## 2016-02-14 NOTE — Patient Instructions (Signed)
   Today we are giving you a handout to read and follow on gas prevention.     Please follow up with Dr Carlean Purl as needed.      I appreciate the opportunity to care for you. Silvano Rusk, MD, [facility]

## 2016-02-14 NOTE — Progress Notes (Signed)
CC: Anemia  Subjective:    Patient ID: Jessica [name], female    DOB: [birth year], 82 y.o.   MRN: [id number]  HPI Jessica [name] is a pleasant 82 y/o female following up for microcytic anemia. Was found to have cameron ulcers and hiatal hernia in June on EGD. She was started on iron replacement as she was found to have a ferritin of 10. She currently denies any nausea, vomiting, hematochezia, lightheadedness, shortness of breath, chest pain, palpations, and fevers. She has been having sleepiness after meals. Does have some bletching and mild heartburn with some foods (pizza), but just avoids them now. She is on Nexium OTC daily. Her constipation issue seem to have resolve with metamucil and Doculax intermittently having 1 soft BM daily.   Labs 02/12/16 12.4 hemoglobin 87.4 MCV 32.2 ferritin  No Known Allergies Outpatient Medications Prior to Visit  Medication Sig Dispense Refill  . amLODipine (NORVASC) 10 MG tablet Take 1 tablet (10 mg total) by mouth daily. 90 tablet 3  . aspirin 81 MG tablet Take 81 mg by mouth daily.    . Biotin 1 MG CAPS Take by mouth.    . cycloSPORINE (RESTASIS) 0.05 % ophthalmic emulsion Place 1 drop into both eyes 2 (two) times daily.    Marland Kitchen esomeprazole (NEXIUM) 20 MG capsule Take 20 mg by mouth daily at 12 noon.    . ferrous sulfate 325 (65 FE) MG tablet Take 1 tablet (325 mg total) by mouth 2 (two) times daily with a meal. 180 tablet 3  . GLUCOSAMINE PO Take by mouth.    Marland Kitchen lisinopril-hydrochlorothiazide (PRINZIDE,ZESTORETIC) 20-12.5 MG tablet Take 2 tablets by mouth daily. 180 tablet 3  . metoprolol succinate (TOPROL-XL) 25 MG 24 hr tablet Take 1 tablet (25 mg total) by mouth daily. 90 tablet 3  . Multiple Vitamins-Minerals (MULTIVITAMIN WITH MINERALS) tablet Take 1 tablet by mouth daily.    Marland Kitchen PATADAY 0.2 % SOLN Reported on 11/20/2015    . pravastatin (PRAVACHOL) 20 MG tablet TAKE 1 TABLET (20 MG TOTAL) BY MOUTH EVERY EVENING. 90 tablet 0  . clindamycin (CLEOCIN)  150 MG capsule     . HYDROcodone-acetaminophen (NORCO) 7.5-325 MG per tablet Take 1 tablet by mouth every 6 (six) hours as needed. (Patient not taking: Reported on 11/20/2015) 30 tablet 0   No facility-administered medications prior to visit.    Past Medical History:  Diagnosis Date  . Allergy   . Back pain    herniated disc  . Basal cell carcinoma of face 11/2014   LEFT Temple  . Cataracts, bilateral    bilateral removed  . Colon polyps   . Constipation   . Cystocele   . Essential hypertension, benign    takes Amlodipine and Prinizide daily  . GERD (gastroesophageal reflux disease)    takes Nexium daily  . Glaucoma    pt denies  . Hay fever   . Hyperlipidemia    takes Pravastatin daily  . Impaired hearing   . Incontinence    occasionally  . Low bone mass 02/2008  . Lymphocytic colitis   . Osteoarthritis    hip, knee; severe  . Osteoporosis   . Pain, lumbar region    partially herniated disc, s/p steroid injection 2007  . Postoperative anemia due to acute blood loss 09/26/2011  . Type II or unspecified type diabetes mellitus without mention of complication, not stated as uncontrolled 08/2010   Diet controlled  . Urinary frequency   . Urinary urgency  Past Surgical History:  Procedure Laterality Date  . BASAL CELL CARCINOMA EXCISION Left 12/14/2014   LEFT temple  . cataract surgery 04/25/13; 05/16/2013    . COLONOSCOPY    . TOE SURGERY     bilateral  . TONSILLECTOMY AND ADENOIDECTOMY    . TOTAL HIP ARTHROPLASTY  09/24/2011   Procedure: TOTAL HIP ARTHROPLASTY;  Surgeon: Ninetta Lights, MD;  Location: [facility];  Service: Orthopedics;  Laterality: Right;   Social History   Social History  . Marital status: Widowed    Spouse name: n/a  . Number of children: 2  . Years of education: 11th grade   Occupational History  . Retired Glass blower/designer    Social History Main Topics  . Smoking status: Former Smoker    Types: Cigarettes    Quit date: 07/07/1995  . Smokeless  tobacco: Never Used  . Alcohol use No  . Drug use: No  . Sexual activity: No   Other Topics Concern  . None   Social History Narrative   Widowed - retired Hospital doctor   Daughter (a retired Forensic psychologist) lives with patient - she has medical problems related to obesity.    Her son and his family live across the street.   2 caffeine drinks/day   10/31/2015      Family History  Problem Relation Age of Onset  . Stroke Mother 56  . Hypertension Mother   . Anesthesia problems Neg Hx   . Hypotension Neg Hx   . Pseudochol deficiency Neg Hx   . Malignant hyperthermia Neg Hx   . Colon cancer Neg Hx   . Esophageal cancer Neg Hx   . Pancreatic cancer Neg Hx   . Rectal cancer Neg Hx   . Stomach cancer Neg Hx   . Heart disease Father 48    Endocarditis  . Rheumatic fever Father   . Obesity Daughter     unable to walk very far  . Arthritis Daughter     Review of Systems See HPI, all other systems negative    Objective:   Physical Exam @BP  120/74   Pulse 74   Ht 5\' 3"  (1.6 m) @  General:  NAD Eyes:   anicteric Lungs:  clear Heart::  S1S2 no rubs, murmurs or gallops Abdomen:  soft and nontender, BS+ Ext:   no edema, cyanosis or clubbing  Data Reviewed:  Labs 02/12/16 EGD 11/20/15 Colonoscopy 11/20/15 Notes 10/30/15    Assessment & Plan:   Encounter Diagnoses  Name Primary?  . Chronic Cameron ulcer Yes  . Chronic blood loss anemia   . Hiatal hernia - large    Patient has responded to iron replacement and now asymptomatic. Anemia is stable at 12.4 hemoglobin. She has been having chronic blood loss from her Lysbeth Galas ulcer and will need to be on iron replacement from here on. I would recommend her to continue her nexium over the counter as she has heartburn and will continue to help with her hiatal hernia. We will give a handout of food to avoid to prevent increase gas production to help belching. We will let PCP follow her hemoglobin and iron from here on.  Patient can follow up as need with [place].  Grayland Ormond PA-S  I have seen the patient with Mr. Venetia Maxon and he has served as a Education administrator. 15 minutes time spent with patient > half in counseling coordination of care  Gatha Mayer, MD, [facility]  [phone] Jacqulynn Cadet, PA-C

## 2016-02-16 ENCOUNTER — Encounter: Payer: Self-pay | Admitting: Internal Medicine

## 2016-02-16 ENCOUNTER — Encounter: Payer: Self-pay | Admitting: Physician Assistant

## 2016-02-16 DIAGNOSIS — D500 Iron deficiency anemia secondary to blood loss (chronic): Secondary | ICD-10-CM

## 2016-02-16 DIAGNOSIS — Z862 Personal history of diseases of the blood and blood-forming organs and certain disorders involving the immune mechanism: Secondary | ICD-10-CM | POA: Insufficient documentation

## 2016-02-16 HISTORY — DX: Iron deficiency anemia secondary to blood loss (chronic): D50.0

## 2016-02-26 ENCOUNTER — Encounter: Payer: Self-pay | Admitting: Physician Assistant

## 2016-02-26 ENCOUNTER — Ambulatory Visit (INDEPENDENT_AMBULATORY_CARE_PROVIDER_SITE_OTHER): Payer: Medicare Other | Admitting: Physician Assistant

## 2016-02-26 VITALS — BP 130/80 | HR 74 | Temp 97.7°F | Resp 18 | Ht 63.0 in | Wt 182.0 lb

## 2016-02-26 DIAGNOSIS — D500 Iron deficiency anemia secondary to blood loss (chronic): Secondary | ICD-10-CM | POA: Diagnosis not present

## 2016-02-26 DIAGNOSIS — E785 Hyperlipidemia, unspecified: Secondary | ICD-10-CM | POA: Diagnosis not present

## 2016-02-26 DIAGNOSIS — Z23 Encounter for immunization: Secondary | ICD-10-CM | POA: Diagnosis not present

## 2016-02-26 DIAGNOSIS — M5441 Lumbago with sciatica, right side: Secondary | ICD-10-CM | POA: Diagnosis not present

## 2016-02-26 DIAGNOSIS — I10 Essential (primary) hypertension: Secondary | ICD-10-CM | POA: Diagnosis not present

## 2016-02-26 DIAGNOSIS — E119 Type 2 diabetes mellitus without complications: Secondary | ICD-10-CM | POA: Diagnosis not present

## 2016-02-26 LAB — CBC WITH DIFFERENTIAL/PLATELET
BASOS ABS: 0 {cells}/uL (ref 0–200)
Basophils Relative: 0 %
EOS ABS: 188 {cells}/uL (ref 15–500)
Eosinophils Relative: 2 %
HEMATOCRIT: 38.9 % (ref 35.0–45.0)
HEMOGLOBIN: 12.9 g/dL (ref 11.7–15.5)
LYMPHS ABS: 3478 {cells}/uL (ref 850–3900)
LYMPHS PCT: 37 %
MCH: 29.5 pg (ref 27.0–33.0)
MCHC: 33.2 g/dL (ref 32.0–36.0)
MCV: 88.8 fL (ref 80.0–100.0)
MONO ABS: 752 {cells}/uL (ref 200–950)
MPV: 9.4 fL (ref 7.5–12.5)
Monocytes Relative: 8 %
NEUTROS PCT: 53 %
Neutro Abs: 4982 cells/uL (ref 1500–7800)
Platelets: 301 10*3/uL (ref 140–400)
RBC: 4.38 MIL/uL (ref 3.80–5.10)
RDW: 16.2 % — ABNORMAL HIGH (ref 11.0–15.0)
WBC: 9.4 10*3/uL (ref 3.8–10.8)

## 2016-02-26 LAB — LIPID PANEL
Cholesterol: 140 mg/dL (ref 125–200)
HDL: 42 mg/dL — AB (ref >=46)
LDL Cholesterol: 67 mg/dL (ref <130)
Total CHOL/HDL Ratio: 3.3 Ratio (ref <=5.0)
Triglycerides: 156 mg/dL — ABNORMAL HIGH (ref <150)
VLDL: 31 mg/dL — ABNORMAL HIGH (ref <30)

## 2016-02-26 LAB — COMPREHENSIVE METABOLIC PANEL
ALBUMIN: 4.5 g/dL (ref 3.6–5.1)
ALK PHOS: 62 U/L (ref 33–130)
ALT: 14 U/L (ref 6–29)
AST: 18 U/L (ref 10–35)
BILIRUBIN TOTAL: 0.4 mg/dL (ref 0.2–1.2)
BUN: 31 mg/dL — ABNORMAL HIGH (ref 7–25)
CALCIUM: 9.6 mg/dL (ref 8.6–10.4)
CO2: 24 mmol/L (ref 20–31)
CREATININE: 0.91 mg/dL — AB (ref 0.60–0.88)
Chloride: 104 mmol/L (ref 98–110)
GLUCOSE: 95 mg/dL (ref 65–99)
Potassium: 4.2 mmol/L (ref 3.5–5.3)
Sodium: 139 mmol/L (ref 135–146)
Total Protein: 7.9 g/dL (ref 6.1–8.1)

## 2016-02-26 LAB — HEMOGLOBIN A1C
HEMOGLOBIN A1C: 5.8 % — AB (ref <5.7)
MEAN PLASMA GLUCOSE: 120 mg/dL

## 2016-02-26 NOTE — Patient Instructions (Signed)
     IF you received an x-ray today, you will receive an invoice from [facility]. Please contact [facility] at [phone] with questions or concerns regarding your invoice.   IF you received labwork today, you will receive an invoice from [facility]. Please contact [facility] at [phone] with questions or concerns regarding your invoice.   Our billing staff will not be able to assist you with questions regarding bills from these companies.  You will be contacted with the lab results as soon as they are available. The fastest way to get your results is to activate your My Chart account. Instructions are located on the last page of this paperwork. If you have not heard from us regarding the results in 2 weeks, please contact this office.      

## 2016-02-26 NOTE — Progress Notes (Signed)
Patient ID: Jessica [name], female    DOB: 03/23/1930, 82 y.o.   MRN: [id number]  PCP: [name], PA-C  Subjective:   Chief Complaint  Patient presents with  . Follow-up    blood pressure/sign handicap placard app    HPI Presents for evaluation of HTN, diabetes and in need of a renewal of her handicap placard.  She feels well, is tolerating her medications and has no new problems/concerns today.  She uses the placard because her ability to ambulate distances over 200 feet is limited by pain in her back due to degenerative changes.  She has been diagnosed with Chronic Lysbeth Galas Ulcers, causing iron deficiency anemia, and released back to me by her GI specialist.    Review of Systems No CP, SOB, HA dizziness. No LE edema. No melena/hematochezia. Not new:  Arthritis pain-low back primarily. Hearing loss. Urine leakage.    Patient Active Problem List   Diagnosis Date Noted  . Iron deficiency anemia due to chronic blood loss 02/16/2016  . 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 Hiatal hernia - 1o cm 11/20/2015  . BMI 30.0-30.9,adult 11/14/2014  . Lymphocytic colitis   . Overactive bladder   . Impaired hearing   . Glaucoma   . Cataracts, bilateral   . Constipation   . Back pain   . GERD (gastroesophageal reflux disease)   . Cystocele   . Low bone mass   . Diabetes type 2, controlled ([place])   . Osteoarthritis   . COLONIC POLYPS, ADENOMATOUS 09/09/2007  . Hyperlipidemia 09/09/2007  . HTN (hypertension) 09/09/2007     Prior to Admission medications   Medication Sig Start Date End Date Taking? Authorizing Provider  amLODipine (NORVASC) 10 MG tablet Take 1 tablet (10 mg total) by mouth daily. 08/28/15  Yes Dwyne Hasegawa, PA-C  aspirin 81 MG tablet Take 81 mg by mouth daily.   Yes Historical Provider, MD  Biotin 1 MG CAPS Take by mouth.   Yes Historical Provider, MD  clindamycin (CLEOCIN) 150 MG capsule  10/30/15  Yes Historical Provider, MD  cycloSPORINE  (RESTASIS) 0.05 % ophthalmic emulsion Place 1 drop into both eyes 2 (two) times daily.   Yes Historical Provider, MD  esomeprazole (NEXIUM) 20 MG capsule Take 20 mg by mouth daily at 12 noon.   Yes Historical Provider, MD  ferrous sulfate 325 (65 FE) MG tablet Take 1 tablet (325 mg total) by mouth 2 (two) times daily with a meal. 11/20/15  Yes Gatha Mayer, MD  GLUCOSAMINE PO Take by mouth.   Yes Historical Provider, MD  HYDROcodone-acetaminophen (NORCO) 7.5-325 MG per tablet Take 1 tablet by mouth every 6 (six) hours as needed. 11/14/14  Yes Juliannah Ohmann, PA-C  lisinopril-hydrochlorothiazide (PRINZIDE,ZESTORETIC) 20-12.5 MG tablet Take 2 tablets by mouth daily. 11/13/15  Yes Nyree Yonker, PA-C  metoprolol succinate (TOPROL-XL) 25 MG 24 hr tablet Take 1 tablet (25 mg total) by mouth daily. 08/29/15  Yes Fabiha Rougeau, PA-C  Multiple Vitamins-Minerals (MULTIVITAMIN WITH MINERALS) tablet Take 1 tablet by mouth daily.   Yes Historical Provider, MD  PATADAY 0.2 % SOLN Reported on 11/20/2015 01/18/13  Yes Historical Provider, MD  pravastatin (PRAVACHOL) 20 MG tablet TAKE 1 TABLET (20 MG TOTAL) BY MOUTH EVERY EVENING. 12/17/15  Yes Keiran Sias, PA-C     No Known Allergies     Objective:  Physical Exam  Constitutional: She is oriented to person, place, and time. She appears well-developed and well-nourished. She is active and cooperative. No distress.  BP 130/80 (BP Location: Right Arm, Patient Position: Sitting, Cuff Size: Small)   Pulse 74   Temp 97.7 F (36.5 C) (Oral)   Resp 18   Ht 5\' 3"  (1.6 m)   Wt 182 lb (82.6 kg)   SpO2 97%   BMI 32.24 kg/m   HENT:  Head: Normocephalic and atraumatic.  Right Ear: Hearing normal.  Left Ear: Hearing normal.  Eyes: Conjunctivae are normal. No scleral icterus.  Neck: Normal range of motion. Neck supple. No thyromegaly present.  Cardiovascular: Normal rate, regular rhythm and normal heart sounds.   Pulses:      Radial pulses are 2+ on the right  side, and 2+ on the left side.  Pulmonary/Chest: Effort normal and breath sounds normal.  Lymphadenopathy:       Head (right side): No tonsillar, no preauricular, no posterior auricular and no occipital adenopathy present.       Head (left side): No tonsillar, no preauricular, no posterior auricular and no occipital adenopathy present.    She has no cervical adenopathy.       Right: No supraclavicular adenopathy present.       Left: No supraclavicular adenopathy present.  Neurological: She is alert and oriented to person, place, and time. No sensory deficit.  Skin: Skin is warm, dry and intact. No rash noted. No cyanosis or erythema. Nails show no clubbing.  Psychiatric: She has a normal mood and affect. Her speech is normal and behavior is normal.           Assessment & Plan:   1. Essential hypertension Controlled. Stable. Continue current treatment. - CBC with Differential/Platelet - Comprehensive metabolic panel  2. Controlled type 2 diabetes mellitus without complication, without long-term current use of insulin (HCC) Controlled. Stable. Continue current treatment. Await lab results. - Comprehensive metabolic panel - Hemoglobin A1c  3. Hyperlipidemia Await labs. Adjust regimen as indicated by results. - Comprehensive metabolic panel - Lipid panel  4. Iron deficiency anemia due to chronic blood loss Has improved. Await labs. Adjust regimen as indicated by results. - CBC with Differential/Platelet  5. Need for influenza vaccination - Flu Vaccine QUAD 36+ mos IM  6. Low back pain with right-sided sciatica, unspecified back pain laterality Handicap placard completed.  Return in about 3 months (around 05/28/2016) for re-evaluation of anemia, glucose and blood pressure.    Fara Chute, PA-C Physician Assistant-Certified [facility]

## 2016-03-17 ENCOUNTER — Other Ambulatory Visit: Payer: Self-pay | Admitting: Physician Assistant

## 2016-06-03 ENCOUNTER — Ambulatory Visit (INDEPENDENT_AMBULATORY_CARE_PROVIDER_SITE_OTHER): Payer: Medicare Other | Admitting: Physician Assistant

## 2016-06-03 ENCOUNTER — Encounter: Payer: Self-pay | Admitting: Physician Assistant

## 2016-06-03 VITALS — BP 128/74 | HR 77 | Temp 98.4°F | Resp 18 | Ht 63.0 in | Wt 179.0 lb

## 2016-06-03 DIAGNOSIS — D500 Iron deficiency anemia secondary to blood loss (chronic): Secondary | ICD-10-CM | POA: Diagnosis not present

## 2016-06-03 DIAGNOSIS — E785 Hyperlipidemia, unspecified: Secondary | ICD-10-CM | POA: Diagnosis not present

## 2016-06-03 DIAGNOSIS — E119 Type 2 diabetes mellitus without complications: Secondary | ICD-10-CM

## 2016-06-03 DIAGNOSIS — M170 Bilateral primary osteoarthritis of knee: Secondary | ICD-10-CM | POA: Diagnosis not present

## 2016-06-03 DIAGNOSIS — I10 Essential (primary) hypertension: Secondary | ICD-10-CM

## 2016-06-03 MED ORDER — PRAVASTATIN SODIUM 20 MG PO TABS: 20.0000 mg | ORAL_TABLET | Freq: Every evening | ORAL | 3 refills | Status: DC

## 2016-06-03 MED ORDER — HYDROCODONE-ACETAMINOPHEN 7.5-325 MG PO TABS: 1.0000 | ORAL_TABLET | Freq: Four times a day (QID) | ORAL | 0 refills | Status: DC | PRN

## 2016-06-03 NOTE — Patient Instructions (Addendum)
Your daughter may want to see Dr. Dennard Nip, a new physician in [place] who specializes in weight loss management.    IF you received an x-ray today, you will receive an invoice from [facility]. Please contact [facility] at [phone] with questions or concerns regarding your invoice.   IF you received labwork today, you will receive an invoice from [facility] [employer]. Please contact [facility] at [phone] with questions or concerns regarding your invoice.   Our billing staff will not be able to assist you with questions regarding bills from these companies.  You will be contacted with the lab results as soon as they are available. The fastest way to get your results is to activate your My Chart account. Instructions are located on the last page of this paperwork. If you have not heard from [place] regarding the results in 2 weeks, please contact this office.

## 2016-06-03 NOTE — Progress Notes (Signed)
Patient ID: Jessica [name], female    DOB: 06/04/1930, 82 y.o.   MRN: [id number]  PCP: Jessica Mons, PA-C  Chief Complaint  Patient presents with  . Follow-up    3 month follow up  . blood work    needs to have blood work done  . blood pressure    states it has been doing good; has chart to show results of previous readings  . Medication Refill    Norco and Pravastatin    Subjective:   Presents for evaluation of diabetes and HTN.  Home BP log 120's-130's/60's-80's. Feels good. Tolerating medications.  Has had increased knee and back pain with increased activity. Home renovations have required moving her belongings.   Review of Systems  Constitutional: Negative for activity change, appetite change, fatigue and unexpected weight change.  HENT: Negative for congestion, dental problem, ear pain, hearing loss, mouth sores, postnasal drip, rhinorrhea, sneezing, sore throat, tinnitus and trouble swallowing.   Eyes: Negative for photophobia, pain, redness and visual disturbance.  Respiratory: Negative for cough, chest tightness and shortness of breath.   Cardiovascular: Negative for chest pain, palpitations and leg swelling.  Gastrointestinal: Negative for abdominal pain, blood in stool, constipation, diarrhea, nausea and vomiting.  Genitourinary: Negative for dysuria, frequency, hematuria and urgency.  Musculoskeletal: Negative for arthralgias, gait problem, myalgias and neck stiffness.  Skin: Negative for rash.  Neurological: Negative for dizziness, speech difficulty, weakness, light-headedness, numbness and headaches.  Hematological: Negative for adenopathy.  Psychiatric/Behavioral: Negative for confusion and sleep disturbance. The patient is not nervous/anxious.        Patient Active Problem List   Diagnosis Date Noted  . Iron deficiency anemia due to chronic blood loss 02/16/2016  . 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 Hiatal hernia - 1o cm 11/20/2015  . BMI  30.0-30.9,adult 11/14/2014  . Lymphocytic colitis   . Overactive bladder   . Impaired hearing   . Glaucoma   . Cataracts, bilateral   . Constipation   . Back pain   . GERD (gastroesophageal reflux disease)   . Cystocele   . Low bone mass   . Diabetes type 2, controlled ([place])   . Osteoarthritis   . COLONIC POLYPS, ADENOMATOUS 09/09/2007  . Hyperlipidemia 09/09/2007  . HTN (hypertension) 09/09/2007     Prior to Admission medications   Medication Sig Start Date End Date Taking? Authorizing Provider  amLODipine (NORVASC) 10 MG tablet Take 1 tablet (10 mg total) by mouth daily. 08/28/15  Yes Kalilah Barua, PA-C  aspirin 81 MG tablet Take 81 mg by mouth daily.   Yes Historical Provider, MD  Biotin 1 MG CAPS Take by mouth.   Yes Historical Provider, MD  clindamycin (CLEOCIN) 150 MG capsule  10/30/15  Yes Historical Provider, MD  cycloSPORINE (RESTASIS) 0.05 % ophthalmic emulsion Place 1 drop into both eyes 2 (two) times daily.   Yes Historical Provider, MD  esomeprazole (NEXIUM) 20 MG capsule Take 20 mg by mouth daily at 12 noon.   Yes Historical Provider, MD  ferrous sulfate 325 (65 FE) MG tablet Take 1 tablet (325 mg total) by mouth 2 (two) times daily with a meal. 11/20/15  Yes Gatha Mayer, MD  GLUCOSAMINE PO Take by mouth.   Yes Historical Provider, MD  HYDROcodone-acetaminophen (NORCO) 7.5-325 MG per tablet Take 1 tablet by mouth every 6 (six) hours as needed. 11/14/14  Yes Vella Colquitt, PA-C  lisinopril-hydrochlorothiazide (PRINZIDE,ZESTORETIC) 20-12.5 MG tablet Take 2 tablets by mouth daily. 11/13/15  Yes  Trueman Worlds, PA-C  metoprolol succinate (TOPROL-XL) 25 MG 24 hr tablet Take 1 tablet (25 mg total) by mouth daily. 08/29/15  Yes Cher Franzoni, PA-C  Multiple Vitamins-Minerals (MULTIVITAMIN WITH MINERALS) tablet Take 1 tablet by mouth daily.   Yes Historical Provider, MD  PATADAY 0.2 % SOLN Reported on 11/20/2015 01/18/13  Yes Historical Provider, MD  pravastatin (PRAVACHOL) 20 MG  tablet TAKE 1 TABLET BY MOUTH EVERY EVENING 03/18/16  Yes Champ Keetch, PA-C     No Known Allergies     Objective:  Physical Exam  Constitutional: She is oriented to person, place, and time. She appears well-developed and well-nourished. She is active and cooperative. No distress.  BP 128/74 (BP Location: Left Arm, Patient Position: Sitting, Cuff Size: Normal)   Pulse 77   Temp 98.4 F (36.9 C) (Oral)   Resp 18   Ht 5\' 3"  (1.6 m)   Wt 179 lb (81.2 kg)   SpO2 98%   BMI 31.71 kg/m   HENT:  Head: Normocephalic and atraumatic.  Right Ear: Hearing normal.  Left Ear: Hearing normal.  Eyes: Conjunctivae are normal. No scleral icterus.  Neck: Normal range of motion. Neck supple. No thyromegaly present.  Cardiovascular: Normal rate, regular rhythm and normal heart sounds.   Pulses:      Radial pulses are 2+ on the right side, and 2+ on the left side.  Pulmonary/Chest: Effort normal and breath sounds normal.  Lymphadenopathy:       Head (right side): No tonsillar, no preauricular, no posterior auricular and no occipital adenopathy present.       Head (left side): No tonsillar, no preauricular, no posterior auricular and no occipital adenopathy present.    She has no cervical adenopathy.       Right: No supraclavicular adenopathy present.       Left: No supraclavicular adenopathy present.  Neurological: She is alert and oriented to person, place, and time. No sensory deficit.  Skin: Skin is warm, dry and intact. No rash noted. No cyanosis or erythema. Nails show no clubbing.  Psychiatric: She has a normal mood and affect. Her speech is normal and behavior is normal.   Diabetic Foot Exam - Simple   Simple Foot Form Diabetic Foot exam was performed with the following findings:  Yes 06/03/2016 10:49 AM  Visual Inspection No deformities, no ulcerations, no other skin breakdown bilaterally:  Yes Sensation Testing Intact to touch and monofilament testing bilaterally:  Yes Pulse  Check Posterior Tibialis and Dorsalis pulse intact bilaterally:  Yes Comments            Assessment & Plan:   1. Essential hypertension Well controlled. Await lab results. - CBC with Differential/Platelet - Comprehensive metabolic panel - TSH  2. Controlled type 2 diabetes mellitus without complication, without long-term current use of insulin ([place]) Has been well controlled. Await labs. - Comprehensive metabolic panel - Hemoglobin A1c - HM Diabetes Eye Exam - HM Diabetes Foot Exam   3. Hyperlipidemia, unspecified hyperlipidemia type Await labs. Adjust regimen as indicated by results. - Comprehensive metabolic panel - Lipid panel - pravastatin (PRAVACHOL) 20 MG tablet; Take 1 tablet (20 mg total) by mouth every evening.  Dispense: 90 tablet; Refill: 3  4. Primary osteoarthritis of both knees Increased pain with recent increased activity. - HYDROcodone-acetaminophen (NORCO) 7.5-325 MG tablet; Take 1 tablet by mouth every 6 (six) hours as needed.  Dispense: 30 tablet; Refill: 0  5. Iron deficiency anemia due to chronic blood loss Await lab  result. - CBC with Differential/Platelet   Fara Chute, PA-C Physician Assistant-Certified [facility]

## 2016-06-04 LAB — TSH: TSH: 2 u[IU]/mL (ref 0.450–4.500)

## 2016-06-04 LAB — CBC WITH DIFFERENTIAL/PLATELET
BASOS ABS: 0.1 10*3/uL (ref 0.0–0.2)
BASOS: 1 %
EOS (ABSOLUTE): 0.2 10*3/uL (ref 0.0–0.4)
Eos: 2 %
Hematocrit: 40.5 % (ref 34.0–46.6)
Hemoglobin: 13.1 g/dL (ref 11.1–15.9)
IMMATURE GRANS (ABS): 0 10*3/uL (ref 0.0–0.1)
IMMATURE GRANULOCYTES: 0 %
LYMPHS: 37 %
Lymphocytes Absolute: 3.6 10*3/uL — ABNORMAL HIGH (ref 0.7–3.1)
MCH: 30.4 pg (ref 26.6–33.0)
MCHC: 32.3 g/dL (ref 31.5–35.7)
MCV: 94 fL (ref 79–97)
Monocytes Absolute: 0.6 10*3/uL (ref 0.1–0.9)
Monocytes: 6 %
NEUTROS PCT: 54 %
Neutrophils Absolute: 5.2 10*3/uL (ref 1.4–7.0)
PLATELETS: 321 10*3/uL (ref 150–379)
RBC: 4.31 x10E6/uL (ref 3.77–5.28)
RDW: 14.4 % (ref 12.3–15.4)
WBC: 9.6 10*3/uL (ref 3.4–10.8)

## 2016-06-04 LAB — COMPREHENSIVE METABOLIC PANEL
ALT: 13 IU/L (ref 0–32)
AST: 21 IU/L (ref 0–40)
Albumin/Globulin Ratio: 1.3 (ref 1.2–2.2)
Albumin: 4.7 g/dL (ref 3.5–4.7)
Alkaline Phosphatase: 73 IU/L (ref 39–117)
BUN/Creatinine Ratio: 30 — ABNORMAL HIGH (ref 12–28)
BUN: 28 mg/dL — AB (ref 8–27)
Bilirubin Total: 0.3 mg/dL (ref 0.0–1.2)
CALCIUM: 10.1 mg/dL (ref 8.7–10.3)
CO2: 25 mmol/L (ref 18–29)
Chloride: 98 mmol/L (ref 96–106)
Creatinine, Ser: 0.92 mg/dL (ref 0.57–1.00)
GFR, EST AFRICAN AMERICAN: 67 mL/min/{1.73_m2} (ref >59)
GFR, EST NON AFRICAN AMERICAN: 58 mL/min/{1.73_m2} — AB (ref >59)
GLUCOSE: 87 mg/dL (ref 65–99)
Globulin, Total: 3.5 g/dL (ref 1.5–4.5)
Potassium: 4.7 mmol/L (ref 3.5–5.2)
Sodium: 142 mmol/L (ref 134–144)
TOTAL PROTEIN: 8.2 g/dL (ref 6.0–8.5)

## 2016-06-04 LAB — HEMOGLOBIN A1C
Est. average glucose Bld gHb Est-mCnc: 120 mg/dL
Hgb A1c MFr Bld: 5.8 % — ABNORMAL HIGH (ref 4.8–5.6)

## 2016-06-04 LAB — LIPID PANEL
CHOLESTEROL TOTAL: 161 mg/dL (ref 100–199)
Chol/HDL Ratio: 3.6 ratio units (ref 0.0–4.4)
HDL: 45 mg/dL (ref >39)
LDL CALC: 78 mg/dL (ref 0–99)
TRIGLYCERIDES: 190 mg/dL — AB (ref 0–149)
VLDL CHOLESTEROL CAL: 38 mg/dL (ref 5–40)

## 2016-07-25 ENCOUNTER — Other Ambulatory Visit: Payer: Self-pay | Admitting: Physician Assistant

## 2016-07-25 NOTE — Telephone Encounter (Signed)
Meds ordered this encounter  Medications  . metoprolol succinate (TOPROL-XL) 25 MG 24 hr tablet    Sig: TAKE 1 TABLET (25 MG TOTAL) BY MOUTH DAILY.    Dispense:  90 tablet    Refill:  3    Patient notified via My Chart.

## 2016-08-26 ENCOUNTER — Ambulatory Visit (INDEPENDENT_AMBULATORY_CARE_PROVIDER_SITE_OTHER): Payer: Medicare Other | Admitting: Physician Assistant

## 2016-08-26 ENCOUNTER — Encounter: Payer: Self-pay | Admitting: Physician Assistant

## 2016-08-26 VITALS — BP 154/66 | HR 95 | Temp 97.8°F | Resp 16 | Ht 63.0 in | Wt 179.0 lb

## 2016-08-26 DIAGNOSIS — E785 Hyperlipidemia, unspecified: Secondary | ICD-10-CM | POA: Diagnosis not present

## 2016-08-26 DIAGNOSIS — I10 Essential (primary) hypertension: Secondary | ICD-10-CM

## 2016-08-26 DIAGNOSIS — E119 Type 2 diabetes mellitus without complications: Secondary | ICD-10-CM

## 2016-08-26 NOTE — Progress Notes (Signed)
Patient ID: Jessica [name], female    DOB: 1929-08-21, 83 y.o.   MRN: [id number]  PCP: [name], PA-C  Chief Complaint  Patient presents with  . Follow-up    BP and bloodwork    Subjective:   Presents for evaluation of diabetes, hyperlipidemia and HTN.  Home BP log reveals readings 120-136/69-83.  Feels good. Tolerating medications without adverse effects. Does not check glucose at home. Manages glucose with diet and exercise. Only new complaint is reduced hearing, but that is being addressed with a new hearing aid which she will receive tomorrow.    Review of Systems  Constitutional: Negative.  Negative for activity change, appetite change, fatigue and unexpected weight change.  HENT: Positive for hearing loss. Negative for congestion, dental problem, ear discharge, ear pain, facial swelling, mouth sores, postnasal drip, rhinorrhea, sinus pain, sinus pressure, sneezing, sore throat, tinnitus and trouble swallowing.   Eyes: Negative for photophobia, pain, redness and visual disturbance.  Respiratory: Negative for cough, chest tightness, shortness of breath and wheezing.   Cardiovascular: Negative for chest pain, palpitations and leg swelling.  Gastrointestinal: Negative for abdominal pain, blood in stool, constipation, diarrhea, nausea and vomiting.  Endocrine: Negative for cold intolerance, heat intolerance, polydipsia, polyphagia and polyuria.  Genitourinary: Negative for dysuria, frequency, hematuria and urgency.  Musculoskeletal: Negative for arthralgias, gait problem, myalgias and neck stiffness.  Skin: Negative for rash.  Neurological: Negative for dizziness, speech difficulty, weakness, light-headedness, numbness and headaches.  Hematological: Negative for adenopathy.  Psychiatric/Behavioral: Negative for confusion, decreased concentration and sleep disturbance. The patient is not nervous/anxious.        Patient Active Problem List   Diagnosis Date  Noted  . History of iron deficiency anemia 02/16/2016  . 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 Hiatal hernia - 1o cm 11/20/2015  . BMI 30.0-30.9,adult 11/14/2014  . Lymphocytic colitis   . Overactive bladder   . Impaired hearing   . Glaucoma   . Cataracts, bilateral   . Constipation   . Back pain   . GERD (gastroesophageal reflux disease)   . Cystocele   . Low bone mass   . Diabetes type 2, controlled ([place])   . Osteoarthritis   . COLONIC POLYPS, ADENOMATOUS 09/09/2007  . Hyperlipidemia 09/09/2007  . HTN (hypertension) 09/09/2007     Prior to Admission medications   Medication Sig Start Date End Date Taking? Authorizing Provider  amLODipine (NORVASC) 10 MG tablet Take 1 tablet (10 mg total) by mouth daily. 08/28/15  Yes Nychelle Cassata, PA-C  aspirin 81 MG tablet Take 81 mg by mouth daily.   Yes Historical Provider, MD  Biotin 1 MG CAPS Take by mouth.   Yes Historical Provider, MD  clindamycin (CLEOCIN) 150 MG capsule  10/30/15  Yes Historical Provider, MD  cycloSPORINE (RESTASIS) 0.05 % ophthalmic emulsion Place 1 drop into both eyes 2 (two) times daily.   Yes Historical Provider, MD  esomeprazole (NEXIUM) 20 MG capsule Take 20 mg by mouth daily at 12 noon.   Yes Historical Provider, MD  ferrous sulfate 325 (65 FE) MG tablet Take 1 tablet (325 mg total) by mouth 2 (two) times daily with a meal. 11/20/15  Yes Gatha Mayer, MD  GLUCOSAMINE PO Take by mouth.   Yes Historical Provider, MD  HYDROcodone-acetaminophen (NORCO) 7.5-325 MG tablet Take 1 tablet by mouth every 6 (six) hours as needed. 06/03/16  Yes Rozella Servello, PA-C  lisinopril-hydrochlorothiazide (PRINZIDE,ZESTORETIC) 20-12.5 MG tablet Take 2 tablets by mouth daily.  11/13/15  Yes Darrian Goodwill, PA-C  metoprolol succinate (TOPROL-XL) 25 MG 24 hr tablet TAKE 1 TABLET (25 MG TOTAL) BY MOUTH DAILY. 07/25/16  Yes Rosaleen Mazer, PA-C  Multiple Vitamins-Minerals (MULTIVITAMIN WITH MINERALS) tablet Take 1 tablet by mouth daily.   Yes  Historical Provider, MD  PATADAY 0.2 % SOLN Reported on 11/20/2015 01/18/13  Yes Historical Provider, MD  pravastatin (PRAVACHOL) 20 MG tablet Take 1 tablet (20 mg total) by mouth every evening. 06/03/16  Yes Melecio Cueto, PA-C     No Known Allergies     Objective:  Physical Exam  Constitutional: She is oriented to person, place, and time. She appears well-developed and well-nourished. She is active and cooperative. No distress.  BP (!) 154/66   Pulse 95   Temp 97.8 F (36.6 C) (Oral)   Resp 16   Ht 5\' 3"  (1.6 m)   Wt 179 lb (81.2 kg)   SpO2 96%   BMI 31.71 kg/m   HENT:  Head: Normocephalic and atraumatic.  Right Ear: Hearing normal.  Left Ear: Hearing normal.  Eyes: Conjunctivae are normal. No scleral icterus.  Neck: Normal range of motion. Neck supple. No thyromegaly present.  Cardiovascular: Normal rate, regular rhythm and normal heart sounds.   Pulses:      Radial pulses are 2+ on the right side, and 2+ on the left side.  Pulmonary/Chest: Effort normal and breath sounds normal.  Lymphadenopathy:       Head (right side): No tonsillar, no preauricular, no posterior auricular and no occipital adenopathy present.       Head (left side): No tonsillar, no preauricular, no posterior auricular and no occipital adenopathy present.    She has no cervical adenopathy.       Right: No supraclavicular adenopathy present.       Left: No supraclavicular adenopathy present.  Neurological: She is alert and oriented to person, place, and time. No sensory deficit.  Skin: Skin is warm, dry and intact. No rash noted. No cyanosis or erythema. Nails show no clubbing.  Psychiatric: She has a normal mood and affect. Her speech is normal and behavior is normal.           Assessment & Plan:   1. Hyperlipidemia, unspecified hyperlipidemia type Await labs. Adjust regimen as indicated by results. - Lipid panel - Comprehensive metabolic panel  2. Controlled type 2 diabetes mellitus without  complication, without long-term current use of insulin ([place]) Await labs. Adjust regimen as indicated by results. - Hemoglobin A1c  3. Essential hypertension Home readings reveal good control. No changes made.  Return in about 3 months (around 11/23/2016) for re-evaluation of BP and glucose.    Fara Chute, PA-C Physician Assistant-Certified [facility] [place]

## 2016-08-26 NOTE — Progress Notes (Signed)
Patient ID: Jessica [name], female    DOB: [birth year], 83 y.o.   MRN: [id number]  PCP: [name], PA-C  Chief Complaint  Patient presents with  . Follow-up    BP and bloodwork    Subjective:   Presents for evaluation of Bloodpressure and follow-up lab results for her new onset DM and Hyperlipidemia. She states she is tolerating medication well and has no adverse reactions and so far or issues. She does have a complaint of hearing loss (chronic) which she is having a new hearing aid made tomorrow via [facility].   Review of Systems  All other systems reviewed and are negative.   Patient Active Problem List   Diagnosis Date Noted  . History of iron deficiency anemia 02/16/2016  . 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 Hiatal hernia - 1o cm 11/20/2015  . BMI 30.0-30.9,adult 11/14/2014  . Lymphocytic colitis   . Overactive bladder   . Impaired hearing   . Glaucoma   . Cataracts, bilateral   . Constipation   . Back pain   . GERD (gastroesophageal reflux disease)   . Cystocele   . Low bone mass   . Diabetes type 2, controlled ([place])   . Osteoarthritis   . COLONIC POLYPS, ADENOMATOUS 09/09/2007  . Hyperlipidemia 09/09/2007  . HTN (hypertension) 09/09/2007     Prior to Admission medications   Medication Sig Start Date End Date Taking? Authorizing Provider  amLODipine (NORVASC) 10 MG tablet Take 1 tablet (10 mg total) by mouth daily. 08/28/15  Yes Chelle Jeffery, PA-C  aspirin 81 MG tablet Take 81 mg by mouth daily.   Yes Historical Provider, MD  Biotin 1 MG CAPS Take by mouth.   Yes Historical Provider, MD  clindamycin (CLEOCIN) 150 MG capsule  10/30/15  Yes Historical Provider, MD  cycloSPORINE (RESTASIS) 0.05 % ophthalmic emulsion Place 1 drop into both eyes 2 (two) times daily.   Yes Historical Provider, MD  esomeprazole (NEXIUM) 20 MG capsule Take 20 mg by mouth daily at 12 noon.   Yes Historical Provider, MD  ferrous sulfate 325 (65 FE) MG tablet Take 1 tablet  (325 mg total) by mouth 2 (two) times daily with a meal. 11/20/15  Yes Gatha Mayer, MD  GLUCOSAMINE PO Take by mouth.   Yes Historical Provider, MD  HYDROcodone-acetaminophen (NORCO) 7.5-325 MG tablet Take 1 tablet by mouth every 6 (six) hours as needed. 06/03/16  Yes Chelle Jeffery, PA-C  lisinopril-hydrochlorothiazide (PRINZIDE,ZESTORETIC) 20-12.5 MG tablet Take 2 tablets by mouth daily. 11/13/15  Yes Chelle Jeffery, PA-C  metoprolol succinate (TOPROL-XL) 25 MG 24 hr tablet TAKE 1 TABLET (25 MG TOTAL) BY MOUTH DAILY. 07/25/16  Yes Chelle Jeffery, PA-C  Multiple Vitamins-Minerals (MULTIVITAMIN WITH MINERALS) tablet Take 1 tablet by mouth daily.   Yes Historical Provider, MD  PATADAY 0.2 % SOLN Reported on 11/20/2015 01/18/13  Yes Historical Provider, MD  pravastatin (PRAVACHOL) 20 MG tablet Take 1 tablet (20 mg total) by mouth every evening. 06/03/16  Yes Chelle Jeffery, PA-C     No Known Allergies     Objective:  Physical Exam  Constitutional: She is oriented to person, place, and time. She appears well-developed and well-nourished.  Blood pressure (!) 154/66, pulse 95, temperature 97.8 F (36.6 C), temperature source Oral, resp. rate 16, height 5\' 3"  (1.6 m), weight 179 lb (81.2 kg), SpO2 96 %.  HENT:  Right Ear: Tympanic membrane, external ear and ear canal normal. Decreased hearing is noted.  Left Ear: Tympanic membrane, external ear and ear canal normal. Decreased hearing (Hearing aid appointment tomorrow) is noted.  Eyes: Conjunctivae are normal. Pupils are equal, round, and reactive to light.  Neck: Normal range of motion. Neck supple.  Cardiovascular: Normal rate, regular rhythm and normal heart sounds.   Pulmonary/Chest: Effort normal and breath sounds normal.  Neurological: She is alert and oriented to person, place, and time.  Skin: Skin is warm and dry.  Psychiatric: She has a normal mood and affect. Her speech is normal and behavior is normal. Judgment and thought content  normal. Cognition and memory are normal.           Assessment & Plan:  1. Hyperlipidemia, unspecified hyperlipidemia type - Lipid panel - Comprehensive metabolic panel  2. Controlled type 2 diabetes mellitus without complication, without long-term current use of insulin ([facility]) - Hemoglobin A1c  3. Essential hypertension Continue lifestyle modification and medication for HTN

## 2016-08-26 NOTE — Patient Instructions (Signed)
     IF you received an x-ray today, you will receive an invoice from [facility]. Please contact [facility] at [phone] with questions or concerns regarding your invoice.   IF you received labwork today, you will receive an invoice from [facility]. Please contact [facility] at [phone] with questions or concerns regarding your invoice.   Our billing staff will not be able to assist you with questions regarding bills from these companies.  You will be contacted with the lab results as soon as they are available. The fastest way to get your results is to activate your My Chart account. Instructions are located on the last page of this paperwork. If you have not heard from us regarding the results in 2 weeks, please contact this office.     

## 2016-08-27 LAB — HEMOGLOBIN A1C
ESTIMATED AVERAGE GLUCOSE: 123 mg/dL
Hgb A1c MFr Bld: 5.9 % — ABNORMAL HIGH (ref 4.8–5.6)

## 2016-08-27 LAB — COMPREHENSIVE METABOLIC PANEL
A/G RATIO: 1.6 (ref 1.2–2.2)
ALBUMIN: 4.9 g/dL — AB (ref 3.5–4.7)
ALT: 16 IU/L (ref 0–32)
AST: 16 IU/L (ref 0–40)
Alkaline Phosphatase: 75 IU/L (ref 39–117)
BUN / CREAT RATIO: 28 (ref 12–28)
BUN: 22 mg/dL (ref 8–27)
Bilirubin Total: 0.3 mg/dL (ref 0.0–1.2)
CALCIUM: 10.2 mg/dL (ref 8.7–10.3)
CO2: 26 mmol/L (ref 18–29)
Chloride: 96 mmol/L (ref 96–106)
Creatinine, Ser: 0.78 mg/dL (ref 0.57–1.00)
GFR, EST AFRICAN AMERICAN: 81 mL/min/{1.73_m2} (ref >59)
GFR, EST NON AFRICAN AMERICAN: 70 mL/min/{1.73_m2} (ref >59)
Globulin, Total: 3.1 g/dL (ref 1.5–4.5)
Glucose: 102 mg/dL — ABNORMAL HIGH (ref 65–99)
POTASSIUM: 4.5 mmol/L (ref 3.5–5.2)
Sodium: 140 mmol/L (ref 134–144)
TOTAL PROTEIN: 8 g/dL (ref 6.0–8.5)

## 2016-08-27 LAB — LIPID PANEL
CHOLESTEROL TOTAL: 158 mg/dL (ref 100–199)
Chol/HDL Ratio: 3.4 ratio units (ref 0.0–4.4)
HDL: 46 mg/dL (ref >39)
LDL Calculated: 81 mg/dL (ref 0–99)
TRIGLYCERIDES: 154 mg/dL — AB (ref 0–149)
VLDL CHOLESTEROL CAL: 31 mg/dL (ref 5–40)

## 2016-09-02 ENCOUNTER — Ambulatory Visit: Payer: Medicare Other | Admitting: Physician Assistant

## 2016-10-28 ENCOUNTER — Other Ambulatory Visit: Payer: Self-pay | Admitting: Physician Assistant

## 2016-10-28 ENCOUNTER — Other Ambulatory Visit: Payer: Self-pay | Admitting: Internal Medicine

## 2016-10-28 DIAGNOSIS — D509 Iron deficiency anemia, unspecified: Secondary | ICD-10-CM

## 2016-10-28 DIAGNOSIS — I10 Essential (primary) hypertension: Secondary | ICD-10-CM

## 2016-10-28 NOTE — Telephone Encounter (Signed)
Please advise Sir, thank you. 

## 2016-10-29 NOTE — Telephone Encounter (Signed)
Refill x 1 year 

## 2016-11-25 ENCOUNTER — Ambulatory Visit (INDEPENDENT_AMBULATORY_CARE_PROVIDER_SITE_OTHER): Payer: Medicare Other | Admitting: Physician Assistant

## 2016-11-25 ENCOUNTER — Encounter: Payer: Self-pay | Admitting: Physician Assistant

## 2016-11-25 VITALS — BP 133/71 | HR 67 | Temp 97.4°F | Resp 16 | Ht 64.0 in | Wt 180.6 lb

## 2016-11-25 DIAGNOSIS — H9193 Unspecified hearing loss, bilateral: Secondary | ICD-10-CM | POA: Diagnosis not present

## 2016-11-25 DIAGNOSIS — E785 Hyperlipidemia, unspecified: Secondary | ICD-10-CM | POA: Diagnosis not present

## 2016-11-25 DIAGNOSIS — M170 Bilateral primary osteoarthritis of knee: Secondary | ICD-10-CM | POA: Diagnosis not present

## 2016-11-25 DIAGNOSIS — I10 Essential (primary) hypertension: Secondary | ICD-10-CM

## 2016-11-25 DIAGNOSIS — E119 Type 2 diabetes mellitus without complications: Secondary | ICD-10-CM

## 2016-11-25 DIAGNOSIS — K5900 Constipation, unspecified: Secondary | ICD-10-CM | POA: Diagnosis not present

## 2016-11-25 MED ORDER — HYDROCODONE-ACETAMINOPHEN 7.5-325 MG PO TABS: 1.0000 | ORAL_TABLET | Freq: Four times a day (QID) | ORAL | 0 refills | Status: DC | PRN

## 2016-11-25 NOTE — Assessment & Plan Note (Signed)
Chronic, worsened by opiate pain relief. Continue to hydrate orally and include fiber in diet and get what exercise she can. PRN stool softener, osmotic laxative.

## 2016-11-25 NOTE — Assessment & Plan Note (Signed)
Stable. Continue assistive device (straight cane) as needed.

## 2016-11-25 NOTE — Assessment & Plan Note (Signed)
Await labs. Adjust regimen as indicated by results.  

## 2016-11-25 NOTE — Assessment & Plan Note (Signed)
Controlled. No change in treatment plan today.

## 2016-11-25 NOTE — Patient Instructions (Signed)
     IF you received an x-ray today, you will receive an invoice from [facility]. Please contact [facility] at [phone] with questions or concerns regarding your invoice.   IF you received labwork today, you will receive an invoice from [facility]. Please contact [facility] at [phone] with questions or concerns regarding your invoice.   Our billing staff will not be able to assist you with questions regarding bills from these companies.  You will be contacted with the lab results as soon as they are available. The fastest way to get your results is to activate your My Chart account. Instructions are located on the last page of this paperwork. If you have not heard from us regarding the results in 2 weeks, please contact this office.     

## 2016-11-25 NOTE — Progress Notes (Signed)
Patient ID: Jessica [name], female    DOB: [birth year], 83 y.o.   MRN: [id number]  PCP: [name], PA-C  Chief Complaint  Patient presents with  . Hypertension    f/u  . Medication Refill    Hydrocodone-Acetaminophen 7.5-325 mg    Subjective:   Presents for evaluation of HTN and OA.  Overall, she feels well. Uses a straight cane when walking on uneven ground or when she'll be gone from home for a while. Tolerating her medications without difficulty or adverse effects. Uses hydrocodone PRN for back and joint pain.   Review of Systems Denies chest pain, shortness of breath, HA, dizziness, vision change, nausea, vomiting, diarrhea, constipation, melena, hematochezia, dysuria, increased urinary urgency or frequency, increased hunger or thirst, unintentional weight change, unexplained myalgias or arthralgias, rash.    Patient Active Problem List   Diagnosis Date Noted  . History of iron deficiency anemia 02/16/2016  . 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 Hiatal hernia - 1o cm 11/20/2015  . BMI 30.0-30.9,adult 11/14/2014  . Lymphocytic colitis   . Overactive bladder   . Impaired hearing   . Glaucoma   . Cataracts, bilateral   . Constipation   . Back pain   . GERD (gastroesophageal reflux disease)   . Cystocele   . Low bone mass   . Diabetes type 2, controlled ([place])   . Osteoarthritis   . COLONIC POLYPS, ADENOMATOUS 09/09/2007  . Hyperlipidemia 09/09/2007  . HTN (hypertension) 09/09/2007     Prior to Admission medications   Medication Sig Start Date End Date Taking? Authorizing Provider  amLODipine (NORVASC) 10 MG tablet TAKE 1 TABLET (10 MG TOTAL) BY MOUTH DAILY. 10/28/16  Yes Makynzie Dobesh, PA-C  aspirin 81 MG tablet Take 81 mg by mouth daily.   Yes [provider]  Biotin 1 MG CAPS Take by mouth.   Yes [provider]  clindamycin (CLEOCIN) 150 MG capsule  10/30/15  Yes [provider]  cycloSPORINE (RESTASIS) 0.05 %  ophthalmic emulsion Place 1 drop into both eyes 2 (two) times daily.   Yes [provider]  esomeprazole (NEXIUM) 20 MG capsule Take 20 mg by mouth daily at 12 noon.   Yes [provider]  ferrous sulfate 325 (65 FE) MG tablet TAKE 1 TABLET (325 MG TOTAL) BY MOUTH 2 (TWO) TIMES DAILY WITH A MEAL. 10/30/16  Yes Gatha Mayer, MD  GLUCOSAMINE PO Take by mouth.   Yes [provider]  HYDROcodone-acetaminophen (NORCO) 7.5-325 MG tablet Take 1 tablet by mouth every 6 (six) hours as needed. 11/25/16  Yes Hitomi Slape, PA-C  lisinopril-hydrochlorothiazide (PRINZIDE,ZESTORETIC) 20-12.5 MG tablet Take 2 tablets by mouth daily. 11/13/15  Yes Tishana Clinkenbeard, PA-C  metoprolol succinate (TOPROL-XL) 25 MG 24 hr tablet TAKE 1 TABLET (25 MG TOTAL) BY MOUTH DAILY. 07/25/16  Yes Nasir Bright, PA-C  Multiple Vitamins-Minerals (MULTIVITAMIN WITH MINERALS) tablet Take 1 tablet by mouth daily.   Yes [provider]  PATADAY 0.2 % SOLN Reported on 11/20/2015 01/18/13  Yes [provider]  pravastatin (PRAVACHOL) 20 MG tablet Take 1 tablet (20 mg total) by mouth every evening. 06/03/16  Yes Algenis Ballin, PA-C     No Known Allergies     Objective:  Physical Exam  Constitutional: She is oriented to person, place, and time. She appears well-developed and well-nourished. She is active and cooperative. No distress.  BP 133/71   Pulse 67   Temp 97.4 F (36.3 C) (Oral)  Resp 16   Ht 5\' 4"  (1.626 m) Comment: pt did not want to remove shoes  Wt 180 lb 9.6 oz (81.9 kg)   SpO2 96%   BMI 31.00 kg/m   HENT:  Head: Normocephalic and atraumatic.  Right Ear: Hearing normal.  Left Ear: Hearing normal.  Eyes: Conjunctivae are normal. No scleral icterus.  Neck: Normal range of motion. Neck supple. No thyromegaly present.  Cardiovascular: Normal rate, regular rhythm and normal heart sounds.   Pulses:      Radial pulses are 2+ on the right side, and 2+ on the left side.    Pulmonary/Chest: Effort normal and breath sounds normal.  Lymphadenopathy:       Head (right side): No tonsillar, no preauricular, no posterior auricular and no occipital adenopathy present.       Head (left side): No tonsillar, no preauricular, no posterior auricular and no occipital adenopathy present.    She has no cervical adenopathy.       Right: No supraclavicular adenopathy present.       Left: No supraclavicular adenopathy present.  Neurological: She is alert and oriented to person, place, and time. No sensory deficit.  Skin: Skin is warm, dry and intact. No rash noted. No cyanosis or erythema. Nails show no clubbing.  Psychiatric: She has a normal mood and affect. Her speech is normal and behavior is normal.       Assessment & Plan:   Problem List Items Addressed This Visit    Hyperlipidemia    Await labs. Adjust regimen as indicated by results.       Relevant Orders   Comprehensive metabolic panel   Lipid panel   HTN (hypertension) - Primary    Controlled. No change in treatment plan today.      Relevant Orders   CBC with Differential/Platelet   Comprehensive metabolic panel   TSH   T4, free   Diabetes type 2, controlled ([place])    Has been well-controlled without medication. Await labs. Adjust regimen as indicated by results. Will start metformin if A1C >7%.      Relevant Orders   Comprehensive metabolic panel   Hemoglobin A1c   Osteoarthritis    Stable. Continue assistive device (straight cane) as needed.      Relevant Medications   HYDROcodone-acetaminophen (NORCO) 7.5-325 MG tablet   Impaired hearing    Not worsening. Continue with hearing aids.      Constipation    Chronic, worsened by opiate pain relief. Continue to hydrate orally and include fiber in diet and get what exercise she can. PRN stool softener, osmotic laxative.          Return in about 4 months (around 03/28/2017).   Fara Chute, PA-C [facility] [place]

## 2016-11-25 NOTE — Assessment & Plan Note (Signed)
Has been well-controlled without medication. Await labs. Adjust regimen as indicated by results. Will start metformin if A1C >7%.

## 2016-11-25 NOTE — Assessment & Plan Note (Signed)
Not worsening. Continue with hearing aids.

## 2016-11-26 LAB — CBC WITH DIFFERENTIAL/PLATELET
BASOS ABS: 0 10*3/uL (ref 0.0–0.2)
Basos: 0 %
EOS (ABSOLUTE): 0.1 10*3/uL (ref 0.0–0.4)
Eos: 2 %
HEMATOCRIT: 38.5 % (ref 34.0–46.6)
Hemoglobin: 12.7 g/dL (ref 11.1–15.9)
Immature Grans (Abs): 0 10*3/uL (ref 0.0–0.1)
Immature Granulocytes: 0 %
LYMPHS ABS: 3.6 10*3/uL — AB (ref 0.7–3.1)
Lymphs: 40 %
MCH: 30.7 pg (ref 26.6–33.0)
MCHC: 33 g/dL (ref 31.5–35.7)
MCV: 93 fL (ref 79–97)
MONOCYTES: 7 %
MONOS ABS: 0.6 10*3/uL (ref 0.1–0.9)
NEUTROS PCT: 51 %
Neutrophils Absolute: 4.7 10*3/uL (ref 1.4–7.0)
Platelets: 269 10*3/uL (ref 150–379)
RBC: 4.14 x10E6/uL (ref 3.77–5.28)
RDW: 14.4 % (ref 12.3–15.4)
WBC: 9 10*3/uL (ref 3.4–10.8)

## 2016-11-26 LAB — COMPREHENSIVE METABOLIC PANEL
ALBUMIN: 4.6 g/dL (ref 3.5–4.7)
ALT: 14 IU/L (ref 0–32)
AST: 24 IU/L (ref 0–40)
Albumin/Globulin Ratio: 1.4 (ref 1.2–2.2)
Alkaline Phosphatase: 66 IU/L (ref 39–117)
BUN / CREAT RATIO: 28 (ref 12–28)
BUN: 27 mg/dL (ref 8–27)
Bilirubin Total: 0.3 mg/dL (ref 0.0–1.2)
CHLORIDE: 103 mmol/L (ref 96–106)
CO2: 21 mmol/L (ref 18–29)
Calcium: 10 mg/dL (ref 8.7–10.3)
Creatinine, Ser: 0.98 mg/dL (ref 0.57–1.00)
GFR calc non Af Amer: 53 mL/min/{1.73_m2} — ABNORMAL LOW (ref >59)
GFR, EST AFRICAN AMERICAN: 61 mL/min/{1.73_m2} (ref >59)
GLOBULIN, TOTAL: 3.2 g/dL (ref 1.5–4.5)
GLUCOSE: 99 mg/dL (ref 65–99)
Potassium: 4.9 mmol/L (ref 3.5–5.2)
Sodium: 139 mmol/L (ref 134–144)
TOTAL PROTEIN: 7.8 g/dL (ref 6.0–8.5)

## 2016-11-26 LAB — LIPID PANEL
CHOL/HDL RATIO: 3.8 ratio (ref 0.0–4.4)
Cholesterol, Total: 158 mg/dL (ref 100–199)
HDL: 42 mg/dL (ref >39)
LDL CALC: 81 mg/dL (ref 0–99)
TRIGLYCERIDES: 177 mg/dL — AB (ref 0–149)
VLDL Cholesterol Cal: 35 mg/dL (ref 5–40)

## 2016-11-26 LAB — T4, FREE: FREE T4: 1.45 ng/dL (ref 0.82–1.77)

## 2016-11-26 LAB — HEMOGLOBIN A1C
Est. average glucose Bld gHb Est-mCnc: 123 mg/dL
Hgb A1c MFr Bld: 5.9 % — ABNORMAL HIGH (ref 4.8–5.6)

## 2016-11-26 LAB — TSH: TSH: 2 u[IU]/mL (ref 0.450–4.500)

## 2017-01-25 ENCOUNTER — Other Ambulatory Visit: Payer: Self-pay | Admitting: Physician Assistant

## 2017-02-07 ENCOUNTER — Other Ambulatory Visit: Payer: Self-pay | Admitting: Physician Assistant

## 2017-03-20 ENCOUNTER — Encounter: Payer: Self-pay | Admitting: Physician Assistant

## 2017-03-20 ENCOUNTER — Ambulatory Visit (INDEPENDENT_AMBULATORY_CARE_PROVIDER_SITE_OTHER): Payer: Medicare Other | Admitting: Physician Assistant

## 2017-03-20 VITALS — BP 128/75 | HR 62 | Temp 98.1°F | Resp 16 | Ht 64.0 in | Wt 167.0 lb

## 2017-03-20 DIAGNOSIS — E785 Hyperlipidemia, unspecified: Secondary | ICD-10-CM | POA: Diagnosis not present

## 2017-03-20 DIAGNOSIS — K5900 Constipation, unspecified: Secondary | ICD-10-CM

## 2017-03-20 DIAGNOSIS — Z23 Encounter for immunization: Secondary | ICD-10-CM

## 2017-03-20 DIAGNOSIS — H9193 Unspecified hearing loss, bilateral: Secondary | ICD-10-CM

## 2017-03-20 DIAGNOSIS — E119 Type 2 diabetes mellitus without complications: Secondary | ICD-10-CM | POA: Diagnosis not present

## 2017-03-20 DIAGNOSIS — Z862 Personal history of diseases of the blood and blood-forming organs and certain disorders involving the immune mechanism: Secondary | ICD-10-CM | POA: Diagnosis not present

## 2017-03-20 DIAGNOSIS — D500 Iron deficiency anemia secondary to blood loss (chronic): Secondary | ICD-10-CM

## 2017-03-20 DIAGNOSIS — I10 Essential (primary) hypertension: Secondary | ICD-10-CM | POA: Diagnosis not present

## 2017-03-20 DIAGNOSIS — M170 Bilateral primary osteoarthritis of knee: Secondary | ICD-10-CM

## 2017-03-20 MED ORDER — LISINOPRIL-HYDROCHLOROTHIAZIDE 20-12.5 MG PO TABS: 2.0000 | ORAL_TABLET | Freq: Every day | ORAL | 3 refills | Status: DC

## 2017-03-20 MED ORDER — HYDROCODONE-ACETAMINOPHEN 7.5-325 MG PO TABS: 1.0000 | ORAL_TABLET | Freq: Four times a day (QID) | ORAL | 0 refills | Status: DC | PRN

## 2017-03-20 NOTE — Progress Notes (Signed)
Patient ID: Jessica [name], female    DOB: September 23, 1929, 83 y.o.   MRN: [id number]  PCP: [name], PA-C  Chief Complaint  Patient presents with  . Hypertension    follow-up    Subjective:   Presents for evaluation of HTN, diabetes, hyperlipidemia.  She has been doing well. Home BP 125-141/76-87. No problems with medications. She does have a mild cough, possibly associated with lisinopril, but it doesn't bother her. Some tinnitus, despite bilateral hearing aids. Some constipation has been worse with the addition of an iron supplement and is managed with OTC stool softener and fiber supplement. Increasing pain in the knees and RIGHT shoulder. Stiffness and 8/10 upon waking each morning. Uses hydrocodone 1-2 times a week, considering return to orthopedics, as a steroid injection helped her RIGHT knee 2 years ago.    Review of Systems As above. No CP, SOB, HA, dizziness. No nausea, vomiting. No fever, chills.    Patient Active Problem List   Diagnosis Date Noted  . History of iron deficiency anemia 02/16/2016  . 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 Hiatal hernia - 1o cm 11/20/2015  . BMI 30.0-30.9,adult 11/14/2014  . Lymphocytic colitis   . Overactive bladder   . Impaired hearing   . Glaucoma   . Cataracts, bilateral   . Constipation   . Back pain   . GERD (gastroesophageal reflux disease)   . Cystocele   . Low bone mass   . Diabetes type 2, controlled ([place])   . Osteoarthritis   . COLONIC POLYPS, ADENOMATOUS 09/09/2007  . Hyperlipidemia 09/09/2007  . HTN (hypertension) 09/09/2007     Prior to Admission medications   Medication Sig Start Date End Date Taking? Authorizing Provider  amLODipine (NORVASC) 10 MG tablet TAKE 1 TABLET (10 MG TOTAL) BY MOUTH DAILY. 10/28/16  Yes Skylyn Slezak, PA-C  aspirin 81 MG tablet Take 81 mg by mouth daily.   Yes [provider]  Biotin 1 MG CAPS Take by mouth.   Yes [provider]  clindamycin  (CLEOCIN) 150 MG capsule  10/30/15  Yes [provider]  cycloSPORINE (RESTASIS) 0.05 % ophthalmic emulsion Place 1 drop into both eyes 2 (two) times daily.   Yes [provider]  esomeprazole (NEXIUM) 20 MG capsule Take 20 mg by mouth daily at 12 noon.   Yes [provider]  ferrous sulfate 325 (65 FE) MG tablet TAKE 1 TABLET (325 MG TOTAL) BY MOUTH 2 (TWO) TIMES DAILY WITH A MEAL. 10/30/16  Yes Gatha Mayer, MD  GLUCOSAMINE PO Take by mouth.   Yes [provider]  HYDROcodone-acetaminophen (NORCO) 7.5-325 MG tablet Take 1 tablet by mouth every 6 (six) hours as needed. 11/25/16  Yes Hercules Hasler, PA-C  lisinopril-hydrochlorothiazide (PRINZIDE,ZESTORETIC) 20-12.5 MG tablet TAKE TWO TABLETS BY MOUTH ONCE DAILY 02/09/17  Yes Lainie Daubert, PA-C  metoprolol succinate (TOPROL-XL) 25 MG 24 hr tablet TAKE 1 TABLET (25 MG TOTAL) BY MOUTH DAILY. 07/25/16  Yes Amberrose Friebel, PA-C  Multiple Vitamins-Minerals (MULTIVITAMIN WITH MINERALS) tablet Take 1 tablet by mouth daily.   Yes [provider]  PATADAY 0.2 % SOLN Reported on 11/20/2015 01/18/13  Yes [provider]  pravastatin (PRAVACHOL) 20 MG tablet Take 1 tablet (20 mg total) by mouth every evening. 06/03/16  Yes Waino Mounsey, PA-C     No Known Allergies     Objective:  Physical Exam  Constitutional: She is oriented to person, place, and time. She appears well-developed and  well-nourished. She is active and cooperative. No distress.  BP 128/75   Pulse 62   Temp 98.1 F (36.7 C) (Oral)   Resp 16   Ht 5\' 4"  (1.626 m)   Wt 167 lb (75.8 kg)   SpO2 96%   BMI 28.67 kg/m   HENT:  Head: Normocephalic and atraumatic.  Right Ear: Hearing normal.  Left Ear: Hearing normal.  Eyes: Conjunctivae are normal. No scleral icterus.  Neck: Normal range of motion. Neck supple. No thyromegaly present.  Cardiovascular: Normal rate, regular rhythm and normal heart sounds.   Pulses:      Radial pulses  are 2+ on the right side, and 2+ on the left side.  Pulmonary/Chest: Effort normal and breath sounds normal.  Lymphadenopathy:       Head (right side): No tonsillar, no preauricular, no posterior auricular and no occipital adenopathy present.       Head (left side): No tonsillar, no preauricular, no posterior auricular and no occipital adenopathy present.    She has no cervical adenopathy.       Right: No supraclavicular adenopathy present.       Left: No supraclavicular adenopathy present.  Neurological: She is alert and oriented to person, place, and time. No sensory deficit.  Skin: Skin is warm, dry and intact. No rash noted. No cyanosis or erythema. Nails show no clubbing.  Psychiatric: She has a normal mood and affect. Her speech is normal and behavior is normal.           Assessment & Plan:   Problem List Items Addressed This Visit    Hyperlipidemia    Await labs. Adjust regimen as indicated by results.       Relevant Medications   lisinopril-hydrochlorothiazide (PRINZIDE,ZESTORETIC) 20-12.5 MG tablet   Other Relevant Orders   Comprehensive metabolic panel (Completed)   HTN (hypertension)    Controlled by home readings.      Relevant Medications   lisinopril-hydrochlorothiazide (PRINZIDE,ZESTORETIC) 20-12.5 MG tablet   Other Relevant Orders   Comprehensive metabolic panel (Completed)   Diabetes type 2, controlled ([place])    Await labs. Adjust regimen as indicated by results.       Relevant Medications   lisinopril-hydrochlorothiazide (PRINZIDE,ZESTORETIC) 20-12.5 MG tablet   Other Relevant Orders   Comprehensive metabolic panel (Completed)   Osteoarthritis   Relevant Medications   HYDROcodone-acetaminophen (NORCO) 7.5-325 MG tablet   Impaired hearing    Follow-up with audiologist regarding tinnitus/hearing aid adjustment.      Constipation    COntinue stool softener and fiber supplement. Consider osmotic laxative (Miralax).      History of iron deficiency  anemia    COntinue iron supplementation and PPI therapy.       Other Visit Diagnoses    Need for prophylactic vaccination and inoculation against influenza    -  Primary   Relevant Orders   Flu Vaccine QUAD 36+ mos IM (Completed)   Iron deficiency anemia due to chronic blood loss           Return in about 4 months (around 07/20/2017) for re-evaluation of glucose, blood pressure and cholesterol.   Fara Chute, PA-C [facility] at [place]

## 2017-03-20 NOTE — Patient Instructions (Addendum)
Remember to contact Dr. Percell Miller regarding steroid injection for OA pain.     IF you received an x-ray today, you will receive an invoice from [facility]. Please contact [facility] at [phone] with questions or concerns regarding your invoice.   IF you received labwork today, you will receive an invoice from [facility]. Please contact [facility] at [phone] with questions or concerns regarding your invoice.   Our billing staff will not be able to assist you with questions regarding bills from these companies.  You will be contacted with the lab results as soon as they are available. The fastest way to get your results is to activate your My Chart account. Instructions are located on the last page of this paperwork. If you have not heard from [place] regarding the results in 2 weeks, please contact this office.

## 2017-03-20 NOTE — Progress Notes (Signed)
Subjective:    Patient ID: Jessica [name], female    DOB: 10-Oct-1929, 83 y.o.   MRN: [id number]  HPI Patient is compliant with all medications. She reports a mild cough with the Lisinopril, but it is not bothersome. She keeps a running log of her blood pressure taken at home. A copy of it was made in clinic today. Highest systolic pressure was 073 and lowest diastolic pressure was 79.  She denies any fever, chills, headaches, dizziness, chest pain, palpitations, dyspnea, muscle pain, abdominal pain, or diarrhea.  She does note bilateral tinnitus and hearing loss. She does wear hearing aids in both ears and has it turned to the highest volume, but continues to have hearing difficulty.  She also reports constipation which she takes Metamucil and stool softener for. She states that after starting Ferrous sulfate, the constipation has worsened, but is able to make a bowel movement daily still. Patient notes she eats a fibrous diet and stays hydrated.   She does note that her osteoarthritis of both knees and RIGHT shoulder has been bothersome. She describes 8/10 pain and stiffness in the joints when she wakes up in the morning. She takes 1-2 tablets of Norco a week to manage the pain, depending if her symptoms are significant. She states she had an injection of the RIGHT knee 2 years ago per Dr. Percell Miller (Orthopedic) and had significant relief of her pain for about 2 years. She is considering contacting them for repeat injections.    Review of Systems As stated above.    Patient Active Problem List   Diagnosis Date Noted  . History of iron deficiency anemia 02/16/2016  . 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 Hiatal hernia - 1o cm 11/20/2015  . BMI 30.0-30.9,adult 11/14/2014  . Lymphocytic colitis   . Overactive bladder   . Impaired hearing   . Glaucoma   . Cataracts, bilateral   . Constipation   . Back pain   . GERD (gastroesophageal reflux disease)   . Cystocele   . Low bone mass   .  Diabetes type 2, controlled ([place])   . Osteoarthritis   . COLONIC POLYPS, ADENOMATOUS 09/09/2007  . Hyperlipidemia 09/09/2007  . HTN (hypertension) 09/09/2007    No Known Allergies   Current Outpatient Prescriptions on File Prior to Visit  Medication Sig Dispense Refill  . amLODipine (NORVASC) 10 MG tablet TAKE 1 TABLET (10 MG TOTAL) BY MOUTH DAILY. 90 tablet 3  . aspirin 81 MG tablet Take 81 mg by mouth daily.    . Biotin 1 MG CAPS Take by mouth.    . clindamycin (CLEOCIN) 150 MG capsule     . cycloSPORINE (RESTASIS) 0.05 % ophthalmic emulsion Place 1 drop into both eyes 2 (two) times daily.    Marland Kitchen esomeprazole (NEXIUM) 20 MG capsule Take 20 mg by mouth daily at 12 noon.    . ferrous sulfate 325 (65 FE) MG tablet TAKE 1 TABLET (325 MG TOTAL) BY MOUTH 2 (TWO) TIMES DAILY WITH A MEAL. 180 tablet 3  . GLUCOSAMINE PO Take by mouth.    . metoprolol succinate (TOPROL-XL) 25 MG 24 hr tablet TAKE 1 TABLET (25 MG TOTAL) BY MOUTH DAILY. 90 tablet 3  . Multiple Vitamins-Minerals (MULTIVITAMIN WITH MINERALS) tablet Take 1 tablet by mouth daily.    Marland Kitchen PATADAY 0.2 % SOLN Reported on 11/20/2015    . pravastatin (PRAVACHOL) 20 MG tablet Take 1 tablet (20 mg total) by mouth every evening. 90 tablet 3  No current facility-administered medications on file prior to visit.    Social History   Social History  . Marital status: Widowed    Spouse name: n/a  . Number of children: 2  . Years of education: 11th grade   Occupational History  . Retired Glass blower/designer    Social History Main Topics  . Smoking status: Former Smoker    Types: Cigarettes    Quit date: 07/07/1995  . Smokeless tobacco: Never Used  . Alcohol use No  . Drug use: No  . Sexual activity: No   Other Topics Concern  . Not on file   Social History Narrative   Widowed - retired Hospital doctor   Daughter (a retired Forensic psychologist) lives with patient - she has medical problems related to obesity.   Her son and his family  live across the street.   2 caffeine drinks/day   10/31/2015      Breast Cancer-relatedfamily history is not on file.   Objective:   Physical Exam  Constitutional: She is oriented to person, place, and time. She appears well-developed and well-nourished.  HENT:  Head: Normocephalic.  Right Ear: External ear normal.  Left Ear: External ear normal.  Eyes: Pupils are equal, round, and reactive to light.  Cardiovascular: Normal rate, regular rhythm, normal heart sounds and intact distal pulses.   Pulmonary/Chest: Effort normal and breath sounds normal.  Neurological: She is alert and oriented to person, place, and time.  Skin: Skin is warm and dry.  Psychiatric: She has a normal mood and affect. Her behavior is normal. Judgment normal.      Assessment & Plan:  1. Need for prophylactic vaccination and inoculation against influenza - Given today - Flu Vaccine QUAD 36+ mos IM  2. Essential hypertension - Continue with Norvasc 10mg , Lisinopril-HCTZ 20-12.5mg , Metoprolol 25mg , and Aspirin 81mg  daily.  - Comprehensive Metabolic Panel obtained today.  - Continue with logging of blood pressures - Counseled on avoiding a high sodium diet - Refill for Lisinopril-HCTZ 20-12.5mg  for 3 months, per patient's request.   3. Constipation, unspecified constipation type - Continue with Metamucil and stool softener daily  - Encouraged to stay hydrated   4. Bilateral hearing loss, unspecified hearing loss type - Continue wearing hearing aids  5. Primary osteoarthritis of both knees - Offered to contact Dr. Percell Miller her orthopedic surgeon regarding repeat steroid injection of knee for osteoarthritis. Patient states she will contact them as she needs to set up her transportation.  - Refill of Norco 7.5/325mg  to be taken PRN for OA pain.   6. Hyperlipidemia, unspecified hyperlipidemia type - Continue with Pravastatin 20mg  daily.   7. Iron deficiency anemia due to chronic blood loss - Continue  taking ferrous sulfate daily.   Respectfully, Denny Levy PA-S 2019

## 2017-03-21 LAB — COMPREHENSIVE METABOLIC PANEL
A/G RATIO: 1.4 (ref 1.2–2.2)
ALBUMIN: 4.8 g/dL — AB (ref 3.5–4.7)
ALT: 15 IU/L (ref 0–32)
AST: 24 IU/L (ref 0–40)
Alkaline Phosphatase: 73 IU/L (ref 39–117)
BILIRUBIN TOTAL: 0.3 mg/dL (ref 0.0–1.2)
BUN / CREAT RATIO: 26 (ref 12–28)
BUN: 26 mg/dL (ref 8–27)
CHLORIDE: 102 mmol/L (ref 96–106)
CO2: 19 mmol/L — ABNORMAL LOW (ref 20–29)
Calcium: 10.2 mg/dL (ref 8.7–10.3)
Creatinine, Ser: 0.99 mg/dL (ref 0.57–1.00)
GFR calc non Af Amer: 52 mL/min/{1.73_m2} — ABNORMAL LOW (ref >59)
GFR, EST AFRICAN AMERICAN: 61 mL/min/{1.73_m2} (ref >59)
GLOBULIN, TOTAL: 3.4 g/dL (ref 1.5–4.5)
Glucose: 97 mg/dL (ref 65–99)
POTASSIUM: 4.6 mmol/L (ref 3.5–5.2)
Sodium: 141 mmol/L (ref 134–144)
TOTAL PROTEIN: 8.2 g/dL (ref 6.0–8.5)

## 2017-03-26 ENCOUNTER — Other Ambulatory Visit: Payer: Self-pay

## 2017-03-26 ENCOUNTER — Telehealth: Payer: Self-pay

## 2017-03-26 DIAGNOSIS — E119 Type 2 diabetes mellitus without complications: Secondary | ICD-10-CM

## 2017-03-26 NOTE — Telephone Encounter (Signed)
Call to patient per Dr. Tamala Julian to come in (no appointment needed) and give a urine specimen to check urine microalbumin.  Dewitt Hoes will put the order in the patient's chart.

## 2017-03-29 NOTE — Assessment & Plan Note (Signed)
COntinue stool softener and fiber supplement. Consider osmotic laxative (Miralax).

## 2017-03-29 NOTE — Assessment & Plan Note (Signed)
Controlled by home readings.

## 2017-03-29 NOTE — Assessment & Plan Note (Signed)
COntinue iron supplementation and PPI therapy.

## 2017-03-29 NOTE — Assessment & Plan Note (Signed)
Await labs. Adjust regimen as indicated by results.  

## 2017-03-29 NOTE — Assessment & Plan Note (Signed)
Follow-up with audiologist regarding tinnitus/hearing aid adjustment.

## 2017-04-06 ENCOUNTER — Other Ambulatory Visit: Payer: Self-pay | Admitting: Physician Assistant

## 2017-06-20 ENCOUNTER — Other Ambulatory Visit: Payer: Self-pay | Admitting: Physician Assistant

## 2017-06-20 DIAGNOSIS — E785 Hyperlipidemia, unspecified: Secondary | ICD-10-CM

## 2017-07-21 ENCOUNTER — Ambulatory Visit: Payer: Medicare Other | Admitting: Physician Assistant

## 2017-07-21 ENCOUNTER — Encounter: Payer: Self-pay | Admitting: Physician Assistant

## 2017-07-21 ENCOUNTER — Other Ambulatory Visit: Payer: Self-pay

## 2017-07-21 VITALS — BP 130/70 | HR 87 | Temp 98.0°F | Resp 18 | Ht 64.0 in | Wt 182.4 lb

## 2017-07-21 DIAGNOSIS — I10 Essential (primary) hypertension: Secondary | ICD-10-CM

## 2017-07-21 DIAGNOSIS — D485 Neoplasm of uncertain behavior of skin: Secondary | ICD-10-CM | POA: Diagnosis not present

## 2017-07-21 DIAGNOSIS — E785 Hyperlipidemia, unspecified: Secondary | ICD-10-CM | POA: Diagnosis not present

## 2017-07-21 DIAGNOSIS — M170 Bilateral primary osteoarthritis of knee: Secondary | ICD-10-CM

## 2017-07-21 DIAGNOSIS — E119 Type 2 diabetes mellitus without complications: Secondary | ICD-10-CM

## 2017-07-21 DIAGNOSIS — K5900 Constipation, unspecified: Secondary | ICD-10-CM

## 2017-07-21 DIAGNOSIS — H9193 Unspecified hearing loss, bilateral: Secondary | ICD-10-CM

## 2017-07-21 LAB — LIPID PANEL
CHOLESTEROL TOTAL: 142 mg/dL (ref 100–199)
Chol/HDL Ratio: 3.2 ratio (ref 0.0–4.4)
HDL: 44 mg/dL (ref >39)
LDL CALC: 61 mg/dL (ref 0–99)
Triglycerides: 187 mg/dL — ABNORMAL HIGH (ref 0–149)
VLDL CHOLESTEROL CAL: 37 mg/dL (ref 5–40)

## 2017-07-21 LAB — COMPREHENSIVE METABOLIC PANEL
A/G RATIO: 1.4 (ref 1.2–2.2)
ALBUMIN: 4.9 g/dL — AB (ref 3.5–4.7)
ALT: 13 IU/L (ref 0–32)
AST: 19 IU/L (ref 0–40)
Alkaline Phosphatase: 79 IU/L (ref 39–117)
BUN / CREAT RATIO: 27 (ref 12–28)
BUN: 26 mg/dL (ref 8–27)
Bilirubin Total: 0.2 mg/dL (ref 0.0–1.2)
CALCIUM: 10 mg/dL (ref 8.7–10.3)
CO2: 24 mmol/L (ref 20–29)
Chloride: 101 mmol/L (ref 96–106)
Creatinine, Ser: 0.97 mg/dL (ref 0.57–1.00)
GFR, EST AFRICAN AMERICAN: 62 mL/min/{1.73_m2} (ref >59)
GFR, EST NON AFRICAN AMERICAN: 53 mL/min/{1.73_m2} — AB (ref >59)
GLOBULIN, TOTAL: 3.4 g/dL (ref 1.5–4.5)
Glucose: 107 mg/dL — ABNORMAL HIGH (ref 65–99)
POTASSIUM: 5 mmol/L (ref 3.5–5.2)
Sodium: 140 mmol/L (ref 134–144)
TOTAL PROTEIN: 8.3 g/dL (ref 6.0–8.5)

## 2017-07-21 LAB — CBC WITH DIFFERENTIAL/PLATELET
BASOS: 0 %
Basophils Absolute: 0 10*3/uL (ref 0.0–0.2)
EOS (ABSOLUTE): 0.1 10*3/uL (ref 0.0–0.4)
EOS: 1 %
HEMATOCRIT: 37.3 % (ref 34.0–46.6)
HEMOGLOBIN: 12.3 g/dL (ref 11.1–15.9)
IMMATURE GRANS (ABS): 0 10*3/uL (ref 0.0–0.1)
IMMATURE GRANULOCYTES: 0 %
LYMPHS: 39 %
Lymphocytes Absolute: 4.2 10*3/uL — ABNORMAL HIGH (ref 0.7–3.1)
MCH: 30.8 pg (ref 26.6–33.0)
MCHC: 33 g/dL (ref 31.5–35.7)
MCV: 93 fL (ref 79–97)
MONOCYTES: 7 %
Monocytes Absolute: 0.8 10*3/uL (ref 0.1–0.9)
NEUTROS ABS: 5.7 10*3/uL (ref 1.4–7.0)
Neutrophils: 53 %
PLATELETS: 312 10*3/uL (ref 150–379)
RBC: 4 x10E6/uL (ref 3.77–5.28)
RDW: 13.8 % (ref 12.3–15.4)
WBC: 10.8 10*3/uL (ref 3.4–10.8)

## 2017-07-21 LAB — HEMOGLOBIN A1C
Est. average glucose Bld gHb Est-mCnc: 117 mg/dL
Hgb A1c MFr Bld: 5.7 % — ABNORMAL HIGH (ref 4.8–5.6)

## 2017-07-21 MED ORDER — HYDROCODONE-ACETAMINOPHEN 7.5-325 MG PO TABS: 1.0000 | ORAL_TABLET | Freq: Four times a day (QID) | ORAL | 0 refills | Status: DC | PRN

## 2017-07-21 NOTE — Progress Notes (Signed)
Patient ID: Jessica [name], female    DOB: 04-18-1930, 84 y.o.   MRN: [id number]  PCP: [name], PA-C  Chief Complaint  Patient presents with  . Hypertension  . Hyperlipidemia  . Diabetes  . Follow-up  . Medication Refill    NORCO    Subjective:   Presents for evaluation of Diabetes, HTN, hyperlipidemia, and needs a refill of hydrocodone for joint pain.  Her chronic conditions have been well controlled. Does not check home glucose, and manages diabetes with her lifestyle. Home BP readings are well controlled. Tolerating her medications without difficulty, though sometimes has difficulty swallowing pills. Taking them with applesauce alleviates that.  Constipation persists. She is afraid of uncontrolled stool, so hasn't used the recommended Miralax. Uses dietary fiber, hydration and PRN stool softeners.  Does not want to continue mammography. Has new hearing aids, still has difficulty hearing.   Review of Systems Denies chest pain, shortness of breath, HA, dizziness, vision change, nausea, vomiting, diarrhea, melena, hematochezia, dysuria, increased urinary urgency or frequency, increased hunger or thirst, unintentional weight change, unexplained myalgias or arthralgias, rash.    Patient Active Problem List   Diagnosis Date Noted  . History of iron deficiency anemia 02/16/2016  . 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 Hiatal hernia - 1o cm 11/20/2015  . BMI 30.0-30.9,adult 11/14/2014  . Lymphocytic colitis   . Overactive bladder   . Impaired hearing   . Glaucoma   . Cataracts, bilateral   . Constipation   . Back pain   . GERD (gastroesophageal reflux disease)   . Cystocele   . Low bone mass   . Diabetes type 2, controlled ([place])   . Osteoarthritis   . COLONIC POLYPS, ADENOMATOUS 09/09/2007  . Hyperlipidemia 09/09/2007  . HTN (hypertension) 09/09/2007     Prior to Admission medications   Medication Sig Start Date End Date Taking? Authorizing  Provider  amLODipine (NORVASC) 10 MG tablet TAKE 1 TABLET (10 MG TOTAL) BY MOUTH DAILY. 10/28/16  Yes Evelio Rueda, PA-C  aspirin 81 MG tablet Take 81 mg by mouth daily.   Yes [provider]  Biotin 1 MG CAPS Take by mouth.   Yes [provider]  cycloSPORINE (RESTASIS) 0.05 % ophthalmic emulsion Place 1 drop into both eyes 2 (two) times daily.   Yes [provider]  esomeprazole (NEXIUM) 20 MG capsule Take 20 mg by mouth daily at 12 noon.   Yes [provider]  ferrous sulfate 325 (65 FE) MG tablet TAKE 1 TABLET (325 MG TOTAL) BY MOUTH 2 (TWO) TIMES DAILY WITH A MEAL. 10/30/16  Yes Gatha Mayer, MD  GLUCOSAMINE PO Take by mouth.   Yes [provider]  HYDROcodone-acetaminophen (NORCO) 7.5-325 MG tablet Take 1 tablet by mouth every 6 (six) hours as needed. 03/20/17  Yes Ahna Konkle, PA-C  lisinopril-hydrochlorothiazide (PRINZIDE,ZESTORETIC) 20-12.5 MG tablet Take 2 tablets by mouth daily. 03/20/17  Yes Chancey Ringel, PA-C  lisinopril-hydrochlorothiazide (PRINZIDE,ZESTORETIC) 20-12.5 MG tablet TAKE 2 TABLETS BY MOUTH EVERY DAY 04/06/17  Yes Berneda Piccininni, PA-C  metoprolol succinate (TOPROL-XL) 25 MG 24 hr tablet TAKE 1 TABLET (25 MG TOTAL) BY MOUTH DAILY. 07/25/16  Yes Annalysa Mohammad, PA-C  Multiple Vitamins-Minerals (MULTIVITAMIN WITH MINERALS) tablet Take 1 tablet by mouth daily.   Yes [provider]  Multiple Vitamins-Minerals (PRESERVISION AREDS 2) CAPS    Yes [provider]  PATADAY 0.2 % SOLN Reported on 11/20/2015 01/18/13  Yes [provider]  pravastatin (PRAVACHOL)  20 MG tablet TAKE 1 TABLET (20 MG TOTAL) BY MOUTH EVERY EVENING. 06/22/17  Yes Brecklynn Jian, PA-C  clindamycin (CLEOCIN) 150 MG capsule  10/30/15   [provider]     No Known Allergies     Objective:  Physical Exam  Constitutional: She is oriented to person, place, and time. She appears well-developed and well-nourished. She is  active and cooperative. No distress.  BP 130/70 (BP Location: Left Arm, Patient Position: Sitting, Cuff Size: Normal)   Pulse 87   Temp 98 F (36.7 C) (Oral)   Resp 18   Ht 5\' 4"  (1.626 m)   Wt 182 lb 6.4 oz (82.7 kg)   SpO2 96%   BMI 31.31 kg/m   HENT:  Head: Normocephalic and atraumatic.  Right Ear: Hearing normal.  Left Ear: Hearing normal.  Eyes: Conjunctivae are normal. No scleral icterus.  Neck: Normal range of motion. Neck supple. No thyromegaly present.  Cardiovascular: Normal rate, regular rhythm and normal heart sounds.  Pulses:      Radial pulses are 2+ on the right side, and 2+ on the left side.  Pulmonary/Chest: Effort normal and breath sounds normal.  Lymphadenopathy:       Head (right side): No tonsillar, no preauricular, no posterior auricular and no occipital adenopathy present.       Head (left side): No tonsillar, no preauricular, no posterior auricular and no occipital adenopathy present.    She has no cervical adenopathy.       Right: No supraclavicular adenopathy present.       Left: No supraclavicular adenopathy present.  Neurological: She is alert and oriented to person, place, and time. No sensory deficit.  Skin: Skin is warm, dry and intact. Lesion noted. No rash noted. No cyanosis or erythema. Nails show no clubbing.     Psychiatric: She has a normal mood and affect. Her speech is normal and behavior is normal.    PROCEDURE: Verbal consent obtain from the patient.  Skin cleansed with alcohol pad, then local anesthesia with 2 cc 2% lidocaine with epinephrine.  8mm punch biopsy performed and specimen sent for pathology review.  Hemostasis achieved with pressure.  Bandage applied.  Local wound care reviewed.        Assessment & Plan:   Problem List Items Addressed This Visit    Hyperlipidemia    Tolerating pravastatin. Goal LDL <70, due to diabetes. Await lipids.      Relevant Orders   Comprehensive metabolic panel   Lipid panel   HTN  (hypertension)    Controlled. Continue current treatment and home monitoring.      Relevant Orders   CBC with Differential/Platelet   Comprehensive metabolic panel   Diabetes type 2, controlled ([place]) - Primary    Manged with healthy lifestyle. Update A1C. Initiate metformin if A1C >7%.      Relevant Orders   Comprehensive metabolic panel   Hemoglobin A1c   Microalbumin / creatinine urine ratio   HM DIABETES EYE EXAM (Completed)   HM DIABETES FOOT EXAM (Completed)   Osteoarthritis    Continue OTC glucosamine and PRN hydrocodone.      Relevant Medications   HYDROcodone-acetaminophen (NORCO) 7.5-325 MG tablet   Impaired hearing    Stable.      Constipation    Concerned for uncontrolled stools, so she doesn't use Miralax previously recommended. High-fiber diet, adequate oral hydration. OTC stool softeners as needed.       Other Visit Diagnoses    Neoplasm  of uncertain behavior of skin of chest       Await pathology report.   Relevant Orders   Dermatology pathology       Return in about 4 months (around 11/18/2017) for re-evaluation of blood pressure, cholesterol, glucose.   Fara Chute, PA-C [facility] at [place]

## 2017-07-21 NOTE — Progress Notes (Signed)
Subjective:    Patient ID: Jessica [name], female    DOB: 26-Jan-1930, 84 y.o.   MRN: [id number]  Chief Complaint  Patient presents with  . Hypertension  . Hyperlipidemia  . Diabetes  . Follow-up  . Medication Refill    [place]   Patient was last seen on 03/20/17 for HTN, hyperlipidemia, and DM. HTN, hyperlipidemia, and DM were well-controlled.  Overall, she feels well. Denies: headache, dizziness, swelling, chest pain, shortness of breath, nausea, vomiting, or diarrhea.  She has had no problems with taking her medications. When some pills are hard to swallow, she takes them with applesauce which helps. No adverse effects from the medications, except a mild cough. The cough comes and goes and causes her no problems.  She continues to have constipation. OTC stool softener, Colace, plus increased fiber in her diet helps. Afraid to take Miralax, because she thinks she won't be able to go anywhere if she takes it.   Diet: tries to include more fiber, Zalads from [employer]  Exercise: has not been walking recently due to cold weather and pain in knees Eye exam: Nov 2018. Everything looks well. Started Preservision to help prevent Macular Degeneration.  Mammogram: 2016. Denies today due to her age 16: Last visit 6 months ago, new hearing aids. Still hard of hearing.   Review of Systems As Above.  Patient Active Problem List   Diagnosis Date Noted  . History of iron deficiency anemia 02/16/2016  . 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 Hiatal hernia - 1o cm 11/20/2015  . BMI 30.0-30.9,adult 11/14/2014  . Lymphocytic colitis   . Overactive bladder   . Impaired hearing   . Glaucoma   . Cataracts, bilateral   . Constipation   . Back pain   . GERD (gastroesophageal reflux disease)   . Cystocele   . Low bone mass   . Diabetes type 2, controlled ([place])   . Osteoarthritis   . COLONIC POLYPS, ADENOMATOUS 09/09/2007  . Hyperlipidemia 09/09/2007  . HTN (hypertension)  09/09/2007   Prior to Admission medications   Medication Sig Start Date End Date Taking? Authorizing Provider  amLODipine (NORVASC) 10 MG tablet TAKE 1 TABLET (10 MG TOTAL) BY MOUTH DAILY. 10/28/16  Yes Jeffery, Chelle, PA-C  aspirin 81 MG tablet Take 81 mg by mouth daily.   Yes [provider]  Biotin 1 MG CAPS Take by mouth.   Yes [provider]  cycloSPORINE (RESTASIS) 0.05 % ophthalmic emulsion Place 1 drop into both eyes 2 (two) times daily.   Yes [provider]  esomeprazole (NEXIUM) 20 MG capsule Take 20 mg by mouth daily at 12 noon.   Yes [provider]  ferrous sulfate 325 (65 FE) MG tablet TAKE 1 TABLET (325 MG TOTAL) BY MOUTH 2 (TWO) TIMES DAILY WITH A MEAL. 10/30/16  Yes Gatha Mayer, MD  GLUCOSAMINE PO Take by mouth.   Yes [provider]  HYDROcodone-acetaminophen (NORCO) 7.5-325 MG tablet Take 1 tablet by mouth every 6 (six) hours as needed. 03/20/17  Yes Jeffery, Chelle, PA-C  lisinopril-hydrochlorothiazide (PRINZIDE,ZESTORETIC) 20-12.5 MG tablet Take 2 tablets by mouth daily. 03/20/17  Yes Jeffery, Chelle, PA-C  lisinopril-hydrochlorothiazide (PRINZIDE,ZESTORETIC) 20-12.5 MG tablet TAKE 2 TABLETS BY MOUTH EVERY DAY 04/06/17  Yes Jeffery, Chelle, PA-C  metoprolol succinate (TOPROL-XL) 25 MG 24 hr tablet TAKE 1 TABLET (25 MG TOTAL) BY MOUTH DAILY. 07/25/16  Yes Jeffery, Chelle, PA-C  Multiple Vitamins-Minerals (MULTIVITAMIN WITH MINERALS) tablet Take 1 tablet by mouth  daily.   Yes [provider]  Multiple Vitamins-Minerals (PRESERVISION AREDS 2) CAPS    Yes [provider]  PATADAY 0.2 % SOLN Reported on 11/20/2015 01/18/13  Yes [provider]  pravastatin (PRAVACHOL) 20 MG tablet TAKE 1 TABLET (20 MG TOTAL) BY MOUTH EVERY EVENING. 06/22/17  Yes Jeffery, Chelle, PA-C  clindamycin (CLEOCIN) 150 MG capsule  10/30/15   [provider]   No Known Allergies     Objective:   Physical Exam  Constitutional:  She is oriented to person, place, and time. She appears well-developed and well-nourished.  Neck: No JVD present. No thyromegaly present.  Cardiovascular: Normal rate, regular rhythm, normal heart sounds and intact distal pulses.  Pulses:      Radial pulses are 2+ on the right side, and 2+ on the left side.       Posterior tibial pulses are 2+ on the right side, and 2+ on the left side.  Pulmonary/Chest: Effort normal and breath sounds normal. No respiratory distress. She has no wheezes. She exhibits no tenderness.  Musculoskeletal: She exhibits no edema.  Lymphadenopathy:    She has no cervical adenopathy.  Neurological: She is alert and oriented to person, place, and time.  Skin: Lesion noted.     Psychiatric: She has a normal mood and affect. Her behavior is normal.   Diabetic Foot Exam - Simple   Simple Foot Form Diabetic Foot exam was performed with the following findings:  Yes 07/21/2017  9:58 AM  Visual Inspection No deformities, no ulcerations, no other skin breakdown bilaterally:  Yes Sensation Testing Intact to touch and monofilament testing bilaterally:  Yes Pulse Check Posterior Tibialis and Dorsalis pulse intact bilaterally:  Yes Comments    After informed verbal consent was obtained, using Betadine for cleansing and 1% Lidocaine with epinephrine for anesthetic, with sterile technique a 3 mm punch biopsy was used to obtain a biopsy specimen of the lesion. Hemostasis was obtained by pressure and wound was not sutured. Antibiotic dressing is applied, and wound care instructions provided. Be alert for any signs of cutaneous infection. The specimen is labeled and sent to pathology for evaluation. The procedure was well tolerated without complications.    Assessment & Plan:  1. Controlled type 2 diabetes mellitus without complication, without long-term current use of insulin (HCC) Last A1C (11/25/16) 5.9%. Previously well controlled without medications. Await labs. Adjust  regimen as indicated by results.   - Comprehensive metabolic panel - Hemoglobin A1c - Microalbumin / creatinine urine ratio - HM DIABETES EYE EXAM - HM DIABETES FOOT EXAM  2. Essential hypertension Controlled based on home readings. Continue current medications.   - CBC with Differential/Platelet - Comprehensive metabolic panel  3. Hyperlipidemia, unspecified hyperlipidemia type Continue Pravastatin. Await labs. Will adjust treatment as indicated by results.   - Comprehensive metabolic panel - Lipid panel  4. Neoplasm of uncertain behavior of skin of chest Punch biopsy taken from right anterior chest. R/O actinic keratosis vs SCC vs other. Will follow-up regarding pathology results.   5. Constipation, unspecified constipation type Continue stool softener (Colace) and increasing fiber in diet.   6. Primary osteoarthritis of both knees Follow-up with orthopedics, Dr. Percell Miller, for steroid injections. Continue current medications PRN.  7. Bilateral hearing loss, unspecified hearing loss type Stable with new hearing aids.  Return in about 4 months (around 11/18/2017) for re-evaluation of blood pressure, cholesterol, glucose.  Noemi Chapel, PA-S

## 2017-07-21 NOTE — Assessment & Plan Note (Signed)
Controlled. Continue current treatment and home monitoring.

## 2017-07-21 NOTE — Assessment & Plan Note (Signed)
Continue OTC glucosamine and PRN hydrocodone.

## 2017-07-21 NOTE — Assessment & Plan Note (Signed)
Manged with healthy lifestyle. Update A1C. Initiate metformin if A1C >7%.

## 2017-07-21 NOTE — Assessment & Plan Note (Signed)
Concerned for uncontrolled stools, so she doesn't use Miralax previously recommended. High-fiber diet, adequate oral hydration. OTC stool softeners as needed.

## 2017-07-21 NOTE — Patient Instructions (Addendum)
Keep bandage on for 24 hours. Keep area on chest clean with soap and water and moist with OTC antibiotic cream or Vaseline.      IF you received an x-ray today, you will receive an invoice from [facility]. Please contact [facility] at [phone] with questions or concerns regarding your invoice.   IF you received labwork today, you will receive an invoice from [place]. Please contact [facility] at [phone] with questions or concerns regarding your invoice.   Our billing staff will not be able to assist you with questions regarding bills from these companies.  You will be contacted with the lab results as soon as they are available. The fastest way to get your results is to activate your My Chart account. Instructions are located on the last page of this paperwork. If you have not heard from [place] regarding the results in 2 weeks, please contact this office.

## 2017-07-21 NOTE — Assessment & Plan Note (Signed)
Tolerating pravastatin. Goal LDL <70, due to diabetes. Await lipids.

## 2017-07-21 NOTE — Assessment & Plan Note (Signed)
Stable

## 2017-07-22 ENCOUNTER — Other Ambulatory Visit: Payer: Self-pay | Admitting: Physician Assistant

## 2017-08-12 ENCOUNTER — Ambulatory Visit: Payer: Medicare Other | Admitting: Physician Assistant

## 2017-08-12 ENCOUNTER — Encounter: Payer: Self-pay | Admitting: Physician Assistant

## 2017-08-12 VITALS — BP 140/64 | HR 82 | Temp 97.8°F | Resp 16 | Ht 64.0 in | Wt 182.0 lb

## 2017-08-12 DIAGNOSIS — L570 Actinic keratosis: Secondary | ICD-10-CM

## 2017-08-12 NOTE — Patient Instructions (Addendum)
Wash the wound daily with soap and water. Apply the antibiotic ointment as needed until the area is healed. If there is any lesion remaining when it's healed from the freezing, we'll freeze again.    IF you received an x-ray today, you will receive an invoice from [facility] Rad[facility]. Please contact [facility] at [phone] with questions or concerns regarding your invoice.   IF you received labwork today, you will receive an invoice from [place]. Please contact [facility] at [phone] with questions or concerns regarding your invoice.   Our billing staff will not be able to assist you with questions regarding bills from these companies.  You will be contacted with the lab results as soon as they are available. The fastest way to get your results is to activate your My Chart account. Instructions are located on the last page of this paperwork. If you have not heard from [place] regarding the results in 2 weeks, please contact this office.

## 2017-08-12 NOTE — Progress Notes (Signed)
Chief Complaint  Patient presents with  . Skin Cancer    Freeze Pre cancerous spot on chest    History of Present Illness: Patient presents for cryotherapy of a pre-cancerous skin lesion.  Round, scaled lesion on the RIGHT anterior chest noted incidentally at her visit 07/21/2017. Biopsy revealed lichenoid actinic keratosis.  No Known Allergies  Prior to Admission medications   Medication Sig Start Date End Date Taking? Authorizing Provider  amLODipine (NORVASC) 10 MG tablet TAKE 1 TABLET (10 MG TOTAL) BY MOUTH DAILY. 10/28/16  Yes Heylee Tant, PA-C  aspirin 81 MG tablet Take 81 mg by mouth daily.   Yes [provider]  Biotin 1 MG CAPS Take by mouth.   Yes [provider]  cycloSPORINE (RESTASIS) 0.05 % ophthalmic emulsion Place 1 drop into both eyes 2 (two) times daily.   Yes [provider]  esomeprazole (NEXIUM) 20 MG capsule Take 20 mg by mouth daily at 12 noon.   Yes [provider]  ferrous sulfate 325 (65 FE) MG tablet TAKE 1 TABLET (325 MG TOTAL) BY MOUTH 2 (TWO) TIMES DAILY WITH A MEAL. 10/30/16  Yes Gatha Mayer, MD  GLUCOSAMINE PO Take by mouth.   Yes [provider]  HYDROcodone-acetaminophen (NORCO) 7.5-325 MG tablet Take 1 tablet by mouth every 6 (six) hours as needed. 07/21/17  Yes Lessly Stigler, PA-C  lisinopril-hydrochlorothiazide (PRINZIDE,ZESTORETIC) 20-12.5 MG tablet Take 2 tablets by mouth daily. 03/20/17  Yes Lakita Sahlin, PA-C  lisinopril-hydrochlorothiazide (PRINZIDE,ZESTORETIC) 20-12.5 MG tablet TAKE 2 TABLETS BY MOUTH EVERY DAY 04/06/17  Yes Hermann Dottavio, PA-C  metoprolol succinate (TOPROL-XL) 25 MG 24 hr tablet TAKE 1 TABLET (25 MG TOTAL) BY MOUTH DAILY. 07/22/17  Yes Cameren Earnest, PA-C  Multiple Vitamins-Minerals (MULTIVITAMIN WITH MINERALS) tablet Take 1 tablet by mouth daily.   Yes [provider]  Multiple Vitamins-Minerals (PRESERVISION AREDS 2) CAPS    Yes [provider]    PATADAY 0.2 % SOLN Reported on 11/20/2015 01/18/13  Yes [provider]  pravastatin (PRAVACHOL) 20 MG tablet TAKE 1 TABLET (20 MG TOTAL) BY MOUTH EVERY EVENING. 06/22/17  Yes Maynard David, PA-C  clindamycin (CLEOCIN) 150 MG capsule  10/30/15   [provider]    Patient Active Problem List   Diagnosis Date Noted  . History of iron deficiency anemia 02/16/2016  . 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 Hiatal hernia - 1o cm 11/20/2015  . BMI 30.0-30.9,adult 11/14/2014  . Lymphocytic colitis   . Overactive bladder   . Impaired hearing   . Glaucoma   . Cataracts, bilateral   . Constipation   . Back pain   . GERD (gastroesophageal reflux disease)   . Cystocele   . Low bone mass   . Diabetes type 2, controlled ([place])   . Osteoarthritis   . COLONIC POLYPS, ADENOMATOUS 09/09/2007  . Hyperlipidemia 09/09/2007  . HTN (hypertension) 09/09/2007     Physical Exam  Constitutional: She is oriented to person, place, and time. She appears well-developed and well-nourished. She is active and cooperative. No distress.  BP 140/64   Pulse 82   Temp 97.8 F (36.6 C) (Oral)   Resp 16   Ht 5\' 4"  (1.626 m)   Wt 182 lb (82.6 kg)   SpO2 97%   BMI 31.24 kg/m    Eyes: Conjunctivae are normal.  Pulmonary/Chest: Effort normal.  Neurological: She is alert and oriented to person, place, and time.  Skin: Skin is warm, dry and  intact. Lesion noted.  Psychiatric: She has a normal mood and affect. Her speech is normal and behavior is normal.   PROCEDURE: Cryotherapy, freeze-thaw-freeze. Patient tolerated well.   ASSESSMENT & PLAN:  1. Lichenoid actinic keratosis S/P cryotherapy. Local wound care. Repeat cryotherapy of any remaining lesion once healed.    Return for re-evaluation in May, as previously planned.   Fara Chute, PA-C Primary Care at [place]

## 2017-10-05 ENCOUNTER — Encounter: Payer: Self-pay | Admitting: Physician Assistant

## 2017-10-12 ENCOUNTER — Other Ambulatory Visit: Payer: Self-pay | Admitting: Internal Medicine

## 2017-10-12 ENCOUNTER — Other Ambulatory Visit: Payer: Self-pay | Admitting: Physician Assistant

## 2017-10-12 DIAGNOSIS — D509 Iron deficiency anemia, unspecified: Secondary | ICD-10-CM

## 2017-10-12 DIAGNOSIS — I10 Essential (primary) hypertension: Secondary | ICD-10-CM

## 2017-10-12 NOTE — Telephone Encounter (Signed)
Please advise Sir, hgb was 12.3 two months ago.

## 2017-10-13 NOTE — Telephone Encounter (Signed)
She should check with PCP about this  This is available OTC also

## 2017-10-13 NOTE — Telephone Encounter (Signed)
I informed Jessica [name] to speak with her PCP. She said she has an appointment with her in May.

## 2017-11-13 ENCOUNTER — Ambulatory Visit: Payer: Medicare Other | Admitting: Physician Assistant

## 2017-11-13 ENCOUNTER — Encounter: Payer: Self-pay | Admitting: Physician Assistant

## 2017-11-13 ENCOUNTER — Other Ambulatory Visit: Payer: Self-pay

## 2017-11-13 VITALS — BP 130/62 | HR 88 | Temp 97.9°F | Resp 16 | Ht 64.0 in | Wt 182.2 lb

## 2017-11-13 DIAGNOSIS — Z862 Personal history of diseases of the blood and blood-forming organs and certain disorders involving the immune mechanism: Secondary | ICD-10-CM

## 2017-11-13 DIAGNOSIS — I10 Essential (primary) hypertension: Secondary | ICD-10-CM | POA: Diagnosis not present

## 2017-11-13 DIAGNOSIS — E119 Type 2 diabetes mellitus without complications: Secondary | ICD-10-CM | POA: Diagnosis not present

## 2017-11-13 DIAGNOSIS — E785 Hyperlipidemia, unspecified: Secondary | ICD-10-CM | POA: Diagnosis not present

## 2017-11-13 NOTE — Patient Instructions (Addendum)
Go ahead and call [facility] to schedule your next visit with me there. [phone]. If you choose to stay at [place] [facility] at [place], I recommend Tenna Delaine, PA-C or Dr. Pamella Pert. Dr. Raliegh Scarlet, at the [facility] office, is great, and would be an even closer option for you.  We recommend that you schedule a mammogram for breast cancer screening. Typically, you do not need a referral to do this. Please contact a local imaging center to schedule your mammogram.  [facility] - [phone]  *ask for the Radiology Department [facility] [place] ([place]) - [phone] or [phone]  [facility] - [phone] [place] [phone] [facility] - [phone]  *ask for the [facility] - [phone]  *ask for the Radiology Department [facility] - [phone]  *ask for the [place] - [phone]    IF you received an x-ray today, you will receive an invoice from [facility] Radiology. Please contact [facility] Radiology at [phone] with questions or concerns regarding your invoice.   IF you received labwork today, you will receive an invoice from [place]. Please contact [facility] at [phone] with questions or concerns regarding your invoice.   Our billing staff will not be able to assist you with questions regarding bills from these companies.  You will be contacted with the lab results as soon as they are available. The fastest way to get your results is to activate your My Chart account. Instructions are located on the last page of this paperwork. If you have not heard from [place] regarding the results in 2 weeks, please contact this office.

## 2017-11-13 NOTE — Progress Notes (Signed)
Patient ID: Jessica [name], female    DOB: September 18, 1929, 84 y.o.   MRN: [id number]  PCP: [name], PA-C  Chief Complaint  Patient presents with  . Hypertension    follow up     Subjective:   Presents for evaluation of diabetes, hypertension, hyperlipidemia.  Feels well.  No specific concerns or questions. Gastroenterology did not refill her iron.  Advised that she have her labs rechecked with me.. If needs to continue Fe supplementation, will need a refill. Constipation, chronic. No hematochezia. Dark stools since started on Fe.  Tolerating her medications without difficulty.  Ate at [facility] yesterday. Generally makes healthy eating choices.  Review of Systems  Constitutional: Negative.   HENT: Negative for sore throat.   Eyes: Negative for visual disturbance.  Respiratory: Negative for cough, chest tightness, shortness of breath and wheezing.   Cardiovascular: Negative for chest pain and palpitations.  Gastrointestinal: Negative for abdominal pain, diarrhea, nausea and vomiting.  Genitourinary: Negative for dysuria, frequency, hematuria and urgency.  Musculoskeletal: Negative for arthralgias and myalgias.  Skin: Negative for rash.  Neurological: Negative for dizziness, weakness and headaches.  Psychiatric/Behavioral: Negative for decreased concentration. The patient is not nervous/anxious.        Patient Active Problem List   Diagnosis Date Noted  . History of iron deficiency anemia 02/16/2016  . 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 Hiatal hernia - 1o cm 11/20/2015  . BMI 30.0-30.9,adult 11/14/2014  . Lymphocytic colitis   . Overactive bladder   . Impaired hearing   . Glaucoma   . Cataracts, bilateral   . Constipation   . Back pain   . GERD (gastroesophageal reflux disease)   . Cystocele   . Low bone mass   . Diabetes type 2, controlled ([place])   . Osteoarthritis   . COLONIC POLYPS, ADENOMATOUS 09/09/2007  . Hyperlipidemia 09/09/2007  . HTN  (hypertension) 09/09/2007     Prior to Admission medications   Medication Sig Start Date End Date Taking? Authorizing Provider  amLODipine (NORVASC) 10 MG tablet TAKE 1 TABLET BY MOUTH EVERY DAY 10/12/17  Yes Jibran Crookshanks, PA-C  aspirin 81 MG tablet Take 81 mg by mouth daily.   Yes [provider]  Biotin 1 MG CAPS Take by mouth.   Yes [provider]  clindamycin (CLEOCIN) 150 MG capsule  10/30/15  Yes [provider]  cycloSPORINE (RESTASIS) 0.05 % ophthalmic emulsion Place 1 drop into both eyes 2 (two) times daily.   Yes [provider]  esomeprazole (NEXIUM) 20 MG capsule Take 20 mg by mouth daily at 12 noon.   Yes [provider]  ferrous sulfate 325 (65 FE) MG tablet TAKE 1 TABLET (325 MG TOTAL) BY MOUTH 2 (TWO) TIMES DAILY WITH A MEAL. 10/30/16  Yes Gatha Mayer, MD  GLUCOSAMINE PO Take by mouth.   Yes [provider]  HYDROcodone-acetaminophen (NORCO) 7.5-325 MG tablet Take 1 tablet by mouth every 6 (six) hours as needed. 07/21/17  Yes Akiva Josey, PA-C  lisinopril-hydrochlorothiazide (PRINZIDE,ZESTORETIC) 20-12.5 MG tablet TAKE 2 TABLETS BY MOUTH EVERY DAY 04/06/17  Yes Myles Mallicoat, PA-C  metoprolol succinate (TOPROL-XL) 25 MG 24 hr tablet TAKE 1 TABLET (25 MG TOTAL) BY MOUTH DAILY. 07/22/17  Yes Garnet Chatmon, PA-C  Multiple Vitamins-Minerals (MULTIVITAMIN WITH MINERALS) tablet Take 1 tablet by mouth daily.   Yes [provider]  Multiple Vitamins-Minerals (PRESERVISION AREDS 2) CAPS    Yes [provider]  PATADAY 0.2 %  SOLN Reported on 11/20/2015 01/18/13  Yes [provider]  pravastatin (PRAVACHOL) 20 MG tablet TAKE 1 TABLET (20 MG TOTAL) BY MOUTH EVERY EVENING. 06/22/17  Yes Etienne Millward, PA-C  lisinopril-hydrochlorothiazide (PRINZIDE,ZESTORETIC) 20-12.5 MG tablet Take 2 tablets by mouth daily. Patient not taking: Reported on 11/13/2017 03/20/17   [name], PA-C     No Known  Allergies     Objective:  Physical Exam  Constitutional: She is oriented to person, place, and time. She appears well-developed and well-nourished. No distress.  BP 130/62   Pulse 88   Temp 97.9 F (36.6 C)   Resp 16   Ht 5\' 4"  (1.626 m)   Wt 182 lb 3.2 oz (82.6 kg)   SpO2 96%   BMI 31.27 kg/m    Eyes: Conjunctivae are normal. No scleral icterus.  Neck: No thyromegaly present.  Cardiovascular: Normal rate, regular rhythm, normal heart sounds and intact distal pulses.  Pulmonary/Chest: Effort normal and breath sounds normal.  Lymphadenopathy:    She has no cervical adenopathy.  Neurological: She is alert and oriented to person, place, and time.  Skin: Skin is warm and dry.  Psychiatric: She has a normal mood and affect. Her speech is normal and behavior is normal.    Wt Readings from Last 3 Encounters:  11/13/17 182 lb 3.2 oz (82.6 kg)  08/12/17 182 lb (82.6 kg)  07/21/17 182 lb 6.4 oz (82.7 kg)       Assessment & Plan:   Problem List Items Addressed This Visit    Hyperlipidemia    Await lab results.  If LDL greater than 70, would increase pravastatin from 20 mg to 40 mg.      Relevant Orders   Comprehensive metabolic panel (Completed)   Lipid panel (Completed)   HTN (hypertension)    Controlled.  Home readings range from 122- 139/68-87.  Continue amlodipine 10 mg daily, lisinopril-HCTZ 20-12.5 daily and metoprolol succinate 25 mg daily      Relevant Orders   CBC with Differential/Platelet (Completed)   Comprehensive metabolic panel (Completed)   TSH (Completed)   Urinalysis, dipstick only   Diabetes type 2, controlled ([place]) - Primary    Has been well controlled on lifestyle modification.  If A1c greater than 7%, will plan low-dose metformin.  Continue ACE inhibitor and statin.  Continue daily aspirin.      Relevant Orders   Comprehensive metabolic panel (Completed)   Hemoglobin A1c (Completed)   Microalbumin / creatinine urine ratio   History of iron  deficiency anemia    Update CBC today.  If he needs to continue iron supplementation, will need a refill.      Relevant Orders   CBC with Differential/Platelet (Completed)       Return in about 4 months (around 03/16/2018) for re-evaluation of diabetes, blood pressure, cholesterol.   Fara Chute, PA-C [facility]

## 2017-11-14 LAB — LIPID PANEL
CHOLESTEROL TOTAL: 135 mg/dL (ref 100–199)
Chol/HDL Ratio: 3.1 ratio (ref 0.0–4.4)
HDL: 44 mg/dL (ref >39)
LDL Calculated: 58 mg/dL (ref 0–99)
TRIGLYCERIDES: 165 mg/dL — AB (ref 0–149)
VLDL CHOLESTEROL CAL: 33 mg/dL (ref 5–40)

## 2017-11-14 LAB — COMPREHENSIVE METABOLIC PANEL
A/G RATIO: 1.5 (ref 1.2–2.2)
ALT: 12 IU/L (ref 0–32)
AST: 20 IU/L (ref 0–40)
Albumin: 4.7 g/dL (ref 3.5–4.7)
Alkaline Phosphatase: 71 IU/L (ref 39–117)
BILIRUBIN TOTAL: 0.3 mg/dL (ref 0.0–1.2)
BUN/Creatinine Ratio: 24 (ref 12–28)
BUN: 22 mg/dL (ref 8–27)
CO2: 22 mmol/L (ref 20–29)
Calcium: 9.9 mg/dL (ref 8.7–10.3)
Chloride: 102 mmol/L (ref 96–106)
Creatinine, Ser: 0.9 mg/dL (ref 0.57–1.00)
GFR calc Af Amer: 67 mL/min/{1.73_m2} (ref >59)
GFR calc non Af Amer: 58 mL/min/{1.73_m2} — ABNORMAL LOW (ref >59)
GLOBULIN, TOTAL: 3.1 g/dL (ref 1.5–4.5)
Glucose: 89 mg/dL (ref 65–99)
Potassium: 4.2 mmol/L (ref 3.5–5.2)
Sodium: 140 mmol/L (ref 134–144)
Total Protein: 7.8 g/dL (ref 6.0–8.5)

## 2017-11-14 LAB — CBC WITH DIFFERENTIAL/PLATELET
BASOS: 0 %
Basophils Absolute: 0 10*3/uL (ref 0.0–0.2)
EOS (ABSOLUTE): 0.2 10*3/uL (ref 0.0–0.4)
Eos: 2 %
Hematocrit: 36.4 % (ref 34.0–46.6)
Hemoglobin: 12.1 g/dL (ref 11.1–15.9)
Immature Grans (Abs): 0 10*3/uL (ref 0.0–0.1)
Immature Granulocytes: 0 %
LYMPHS ABS: 3.4 10*3/uL — AB (ref 0.7–3.1)
Lymphs: 37 %
MCH: 30.3 pg (ref 26.6–33.0)
MCHC: 33.2 g/dL (ref 31.5–35.7)
MCV: 91 fL (ref 79–97)
MONOS ABS: 0.5 10*3/uL (ref 0.1–0.9)
Monocytes: 5 %
NEUTROS ABS: 5 10*3/uL (ref 1.4–7.0)
NEUTROS PCT: 56 %
PLATELETS: 281 10*3/uL (ref 150–379)
RBC: 4 x10E6/uL (ref 3.77–5.28)
RDW: 14.2 % (ref 12.3–15.4)
WBC: 9.1 10*3/uL (ref 3.4–10.8)

## 2017-11-14 LAB — HEMOGLOBIN A1C
ESTIMATED AVERAGE GLUCOSE: 123 mg/dL
Hgb A1c MFr Bld: 5.9 % — ABNORMAL HIGH (ref 4.8–5.6)

## 2017-11-14 LAB — TSH: TSH: 1.91 u[IU]/mL (ref 0.450–4.500)

## 2017-11-14 NOTE — Assessment & Plan Note (Signed)
Controlled.  Home readings range from 122- 139/68-87.  Continue amlodipine 10 mg daily, lisinopril-HCTZ 20-12.5 daily and metoprolol succinate 25 mg daily

## 2017-11-14 NOTE — Assessment & Plan Note (Signed)
Update CBC today.  If he needs to continue iron supplementation, will need a refill.

## 2017-11-14 NOTE — Assessment & Plan Note (Signed)
Await lab results.  If LDL greater than 70, would increase pravastatin from 20 mg to 40 mg.

## 2017-11-14 NOTE — Assessment & Plan Note (Signed)
Has been well controlled on lifestyle modification.  If A1c greater than 7%, will plan low-dose metformin.  Continue ACE inhibitor and statin.  Continue daily aspirin.

## 2017-11-16 ENCOUNTER — Encounter: Payer: Self-pay | Admitting: Physician Assistant

## 2017-11-18 ENCOUNTER — Ambulatory Visit: Payer: Medicare Other | Admitting: Physician Assistant

## 2017-11-20 ENCOUNTER — Ambulatory Visit: Payer: Self-pay | Admitting: Physician Assistant

## 2017-12-13 ENCOUNTER — Encounter: Payer: Self-pay | Admitting: Physician Assistant

## 2018-03-09 NOTE — Progress Notes (Signed)
Chief Complaint  Patient presents with  . Blood Pressure Check    4 month f/u / transfer of care (former [facility] pt)  . Medication Refill    norco for back/hip/joint pain    HPI  Hypertension: Patient here for follow-up of elevated blood pressure. She is not exercising due to mobility limitations and is adherent to low salt diet.  Blood pressure is well controlled at home. Cardiac symptoms none. Patient denies chest pain, chest pressure/discomfort, claudication, exertional chest pressure/discomfort, irregular heart beat, lower extremity edema, near-syncope and orthopnea.  Cardiovascular risk factors: advanced age (older than 64 for men, 38 for women), diabetes mellitus, hypertension and sedentary lifestyle. Use of agents associated with hypertension: none. History of target organ damage: none. BP Readings from Last 3 Encounters:  03/10/18 124/75  11/13/17 130/62  08/12/17 140/64   Home readings brought in range from 121-139/67-86  Diabetes Mellitus: Patient presents for follow up of diabetes. Symptoms: none.  Patient denies hyperglycemia, hypoglycemia , nausea, polydipsia, polyuria and visual disturbances.  Evaluation to date has been included: hemoglobin A1C.  Home sugars: patient does not check sugars. Treatment to date: ADA Lab Results  Component Value Date   HGBA1C 5.9 (H) 11/13/2017   Wt Readings from Last 3 Encounters:  03/10/18 179 lb 6.4 oz (81.4 kg)  11/13/17 182 lb 3.2 oz (82.6 kg)  08/12/17 182 lb (82.6 kg)   Osteoarthritis She reports that when the weather gets back her back, hips, wrist, knees hurt to the point where she cannot function much. She takes tylenol but in the last two weeks she has needed her Norco  She takes that sparingly   Past Medical History:  Diagnosis Date  . Allergy   . Back pain    herniated disc  . Basal cell carcinoma of face 11/2014   LEFT [place]  . Cataracts, bilateral    bilateral removed  . Colon polyps   . Constipation   .  Cystocele   . Essential hypertension, benign    takes Amlodipine and Prinizide daily  . GERD (gastroesophageal reflux disease)    takes Nexium daily  . Glaucoma    pt denies  . Hay fever   . Hyperlipidemia    takes Pravastatin daily  . Impaired hearing   . Incontinence    occasionally  . Iron deficiency anemia due to chronic blood loss 02/16/2016   Due to chronic cameron ulcers. See GI notes. Continue PPI and iron supplementation.  . Low bone mass 02/2008  . Lymphocytic colitis   . Osteoarthritis    hip, knee; severe  . Osteoporosis   . Pain, lumbar region    partially herniated disc, s/p steroid injection 2007  . Postoperative anemia due to acute blood loss 09/26/2011  . Type II or unspecified type diabetes mellitus without mention of complication, not stated as uncontrolled 08/2010   Diet controlled  . Urinary frequency   . Urinary urgency     Current Outpatient Medications  Medication Sig Dispense Refill  . amLODipine (NORVASC) 10 MG tablet Take 1 tablet (10 mg total) by mouth daily. 90 tablet 3  . aspirin 81 MG tablet Take 81 mg by mouth daily.    . Biotin 1 MG CAPS Take by mouth.    . cycloSPORINE (RESTASIS) 0.05 % ophthalmic emulsion Place 1 drop into both eyes 2 (two) times daily.    Marland Kitchen esomeprazole (NEXIUM) 20 MG capsule Take 1 capsule (20 mg total) by mouth daily at 12 noon. 90 capsule  3  . ferrous sulfate 325 (65 FE) MG tablet TAKE 1 TABLET (325 MG TOTAL) BY MOUTH 2 (TWO) TIMES DAILY WITH A MEAL. 180 tablet 3  . GLUCOSAMINE PO Take by mouth.    Marland Kitchen HYDROcodone-acetaminophen (NORCO) 7.5-325 MG tablet Take 1 tablet by mouth every 6 (six) hours as needed. 30 tablet 0  . lisinopril-hydrochlorothiazide (PRINZIDE,ZESTORETIC) 20-12.5 MG tablet Take 2 tablets by mouth daily. 60 tablet 0  . metoprolol succinate (TOPROL-XL) 25 MG 24 hr tablet Take 1 tablet (25 mg total) by mouth daily. Keep on file. Pt not due for refill. 90 tablet 3  . Multiple Vitamins-Minerals (MULTIVITAMIN WITH  MINERALS) tablet Take 1 tablet by mouth daily.    . Multiple Vitamins-Minerals (PRESERVISION AREDS 2) CAPS     . pravastatin (PRAVACHOL) 20 MG tablet Take 1 tablet (20 mg total) by mouth every evening. 90 tablet 3  . clindamycin (CLEOCIN) 150 MG capsule     . PATADAY 0.2 % SOLN Reported on 11/20/2015     No current facility-administered medications for this visit.     Allergies: No Known Allergies  Past Surgical History:  Procedure Laterality Date  . BASAL CELL CARCINOMA EXCISION Left 12/14/2014   LEFT temple  . cataract surgery 04/25/13; 05/16/2013    . COLONOSCOPY    . TOE SURGERY     bilateral  . TONSILLECTOMY AND ADENOIDECTOMY    . TOTAL HIP ARTHROPLASTY  09/24/2011   Procedure: TOTAL HIP ARTHROPLASTY;  Surgeon: Ninetta Lights, MD;  Location: [facility];  Service: Orthopedics;  Laterality: Right;    Social History   Socioeconomic History  . Marital status: Widowed    Spouse name: n/a  . Number of children: 2  . Years of education: 11th grade  . Highest education level: Not on file  Occupational History  . Occupation: Retired Glass blower/designer  Social Needs  . Financial resource strain: Not on file  . Food insecurity:    Worry: Not on file    Inability: Not on file  . Transportation needs:    Medical: Not on file    Non-medical: Not on file  Tobacco Use  . Smoking status: Former Smoker    Types: Cigarettes    Last attempt to quit: 07/07/1995    Years since quitting: 22.6  . Smokeless tobacco: Never Used  Substance and Sexual Activity  . Alcohol use: No  . Drug use: No  . Sexual activity: Never  Lifestyle  . Physical activity:    Days per week: Not on file    Minutes per session: Not on file  . Stress: Not on file  Relationships  . Social connections:    Talks on phone: Not on file    Gets together: Not on file    Attends religious service: Not on file    Active member of club or organization: Not on file    Attends meetings of clubs or organizations: Not on file      Relationship status: Not on file  Other Topics Concern  . Not on file  Social History Narrative   Widowed - retired Hospital doctor   Daughter (a retired Forensic psychologist) lives with patient - she has medical problems related to obesity.   Her son and his family live across the street.   2 caffeine drinks/day   10/31/2015    Family History  Problem Relation Age of Onset  . Stroke Mother 73  . Hypertension Mother   . Heart disease Father  56       Endocarditis  . Rheumatic fever Father   . Obesity Daughter        unable to walk very far  . Arthritis Daughter   . Anesthesia problems Neg Hx   . Hypotension Neg Hx   . Pseudochol deficiency Neg Hx   . Malignant hyperthermia Neg Hx   . Colon cancer Neg Hx   . Esophageal cancer Neg Hx   . Pancreatic cancer Neg Hx   . Rectal cancer Neg Hx   . Stomach cancer Neg Hx      ROS Review of Systems See HPI Constitution: No fevers or chills No malaise No diaphoresis Skin: No rash or itching Eyes: no blurry vision, no double vision GU: no dysuria or hematuria Neuro: no dizziness or headaches all others reviewed and negative   Objective: Vitals:   03/10/18 0944  BP: 124/75  Pulse: 73  Resp: 16  Temp: 98.6 F (37 C)  TempSrc: Oral  SpO2: 95%  Weight: 179 lb 6.4 oz (81.4 kg)  Height: 5\' 4"  (1.626 m)    Physical Exam  Constitutional: She is oriented to person, place, and time. She appears well-developed and well-nourished.  Ambulates with cane  HENT:  Head: Normocephalic and atraumatic.  Eyes: Conjunctivae and EOM are normal.  Cardiovascular: Normal rate, regular rhythm and normal heart sounds.  No murmur heard. Pulmonary/Chest: Effort normal and breath sounds normal. No stridor. No respiratory distress. She has no wheezes.  Neurological: She is alert and oriented to person, place, and time.  Skin: Skin is warm. Capillary refill takes less than 2 seconds.  Psychiatric: She has a normal mood and affect. Her  behavior is normal. Judgment and thought content normal.    Assessment and Plan Jessica [name] was seen today for blood pressure check and medication refill.  Diagnoses and all orders for this visit:  Controlled type 2 diabetes mellitus without complication, without long-term current use of insulin ([place])- well controlled hemoglobin a1c is at goal Continue exercise Lipids monitored and renal function in range Reviewed diabetic foot care Emphasized importance of eye and dental exam  -     Hemoglobin A1c  Essential hypertension- Patient's blood pressure is at goal of 139/89 or less. Condition is stable. Continue current medications and treatment plan. I recommend that you exercise for 30-45 minutes 5 days a week. I also recommend a balanced diet with fruits and vegetables every day, lean meats, and little fried foods. The DASH diet (you can find this online) is a good example of this.  -     Comprehensive metabolic panel -     amLODipine (NORVASC) 10 MG tablet; Take 1 tablet (10 mg total) by mouth daily.  Flu vaccine need -     Flu vaccine HIGH DOSE PF (Fluzone High dose)  Primary osteoarthritis of both knees- pt typically uses topical medication and tylenol arthritis She takes Norco sparingly and is very cautious but it helps her improve her quality of life -     HYDROcodone-acetaminophen (NORCO) 7.5-325 MG tablet; Take 1 tablet by mouth every 6 (six) hours as needed.  Hyperlipidemia, unspecified hyperlipidemia type- stable on statin -     pravastatin (PRAVACHOL) 20 MG tablet; Take 1 tablet (20 mg total) by mouth every evening.  Ambulates with cane- discussed fall precautions  Other orders -     lisinopril-hydrochlorothiazide (PRINZIDE,ZESTORETIC) 20-12.5 MG tablet; Take 2 tablets by mouth daily. -     metoprolol succinate (TOPROL-XL) 25 MG 24  hr tablet; Take 1 tablet (25 mg total) by mouth daily. Keep on file. Pt not due for refill. -     esomeprazole (NEXIUM) 20 MG capsule; Take 1  capsule (20 mg total) by mouth daily at 12 noon.   Patient to return for medication wellness exam in six months Since she is not dependent on Norco and uses it as needed for arthritis and there are no red flags will do refills when patient requests without her coming back in until her next visit in six months Discussed adverse reactions and reemphasized fall risks  [place]

## 2018-03-10 ENCOUNTER — Other Ambulatory Visit: Payer: Self-pay

## 2018-03-10 ENCOUNTER — Encounter: Payer: Self-pay | Admitting: Family Medicine

## 2018-03-10 ENCOUNTER — Ambulatory Visit: Payer: Medicare Other | Admitting: Family Medicine

## 2018-03-10 VITALS — BP 124/75 | HR 73 | Temp 98.6°F | Resp 16 | Ht 64.0 in | Wt 179.4 lb

## 2018-03-10 DIAGNOSIS — I10 Essential (primary) hypertension: Secondary | ICD-10-CM | POA: Diagnosis not present

## 2018-03-10 DIAGNOSIS — M170 Bilateral primary osteoarthritis of knee: Secondary | ICD-10-CM

## 2018-03-10 DIAGNOSIS — E119 Type 2 diabetes mellitus without complications: Secondary | ICD-10-CM | POA: Diagnosis not present

## 2018-03-10 DIAGNOSIS — Z23 Encounter for immunization: Secondary | ICD-10-CM | POA: Diagnosis not present

## 2018-03-10 DIAGNOSIS — E785 Hyperlipidemia, unspecified: Secondary | ICD-10-CM

## 2018-03-10 DIAGNOSIS — Z9989 Dependence on other enabling machines and devices: Secondary | ICD-10-CM

## 2018-03-10 LAB — COMPREHENSIVE METABOLIC PANEL
A/G RATIO: 1.4 (ref 1.2–2.2)
ALK PHOS: 103 IU/L (ref 39–117)
ALT: 15 IU/L (ref 0–32)
AST: 21 IU/L (ref 0–40)
Albumin: 4.7 g/dL (ref 3.5–4.7)
BUN/Creatinine Ratio: 29 — ABNORMAL HIGH (ref 12–28)
BUN: 28 mg/dL — ABNORMAL HIGH (ref 8–27)
Bilirubin Total: 0.3 mg/dL (ref 0.0–1.2)
CO2: 19 mmol/L — ABNORMAL LOW (ref 20–29)
Calcium: 10.2 mg/dL (ref 8.7–10.3)
Chloride: 101 mmol/L (ref 96–106)
Creatinine, Ser: 0.96 mg/dL (ref 0.57–1.00)
GFR calc Af Amer: 62 mL/min/{1.73_m2} (ref >59)
GFR calc non Af Amer: 54 mL/min/{1.73_m2} — ABNORMAL LOW (ref >59)
Globulin, Total: 3.3 g/dL (ref 1.5–4.5)
Glucose: 98 mg/dL (ref 65–99)
POTASSIUM: 4.9 mmol/L (ref 3.5–5.2)
Sodium: 138 mmol/L (ref 134–144)
Total Protein: 8 g/dL (ref 6.0–8.5)

## 2018-03-10 LAB — HEMOGLOBIN A1C
ESTIMATED AVERAGE GLUCOSE: 123 mg/dL
HEMOGLOBIN A1C: 5.9 % — AB (ref 4.8–5.6)

## 2018-03-10 MED ORDER — LISINOPRIL-HYDROCHLOROTHIAZIDE 20-12.5 MG PO TABS: 2.0000 | ORAL_TABLET | Freq: Every day | ORAL | 0 refills | Status: DC

## 2018-03-10 MED ORDER — METOPROLOL SUCCINATE ER 25 MG PO TB24: 25.0000 mg | ORAL_TABLET | Freq: Every day | ORAL | 3 refills | Status: DC

## 2018-03-10 MED ORDER — HYDROCODONE-ACETAMINOPHEN 7.5-325 MG PO TABS: 1.0000 | ORAL_TABLET | Freq: Four times a day (QID) | ORAL | 0 refills | Status: DC | PRN

## 2018-03-10 MED ORDER — PRAVASTATIN SODIUM 20 MG PO TABS: 20.0000 mg | ORAL_TABLET | Freq: Every evening | ORAL | 3 refills | Status: DC

## 2018-03-10 MED ORDER — ESOMEPRAZOLE MAGNESIUM 20 MG PO CPDR: 20.0000 mg | DELAYED_RELEASE_CAPSULE | Freq: Every day | ORAL | 3 refills | Status: AC

## 2018-03-10 MED ORDER — AMLODIPINE BESYLATE 10 MG PO TABS: 10.0000 mg | ORAL_TABLET | Freq: Every day | ORAL | 3 refills | Status: DC

## 2018-03-10 NOTE — Patient Instructions (Addendum)
   If you have lab work done today you will be contacted with your lab results within the next 2 weeks.  If you have not heard from us then please contact us. The fastest way to get your results is to register for My Chart.   IF you received an x-ray today, you will receive an invoice from [facility]. Please contact [facility] at [phone] with questions or concerns regarding your invoice.   IF you received labwork today, you will receive an invoice from [facility]. Please contact [facility] at [phone] with questions or concerns regarding your invoice.   Our billing staff will not be able to assist you with questions regarding bills from these companies.  You will be contacted with the lab results as soon as they are available. The fastest way to get your results is to activate your My Chart account. Instructions are located on the last page of this paperwork. If you have not heard from us regarding the results in 2 weeks, please contact this office.     Arthritis Arthritis is a term that is commonly used to refer to joint pain or joint disease. There are more than 100 types of arthritis. What are the causes? The most common cause of this condition is wear and tear of a joint. Other causes include:  Gout.  Inflammation of a joint.  An infection of a joint.  Sprains and other injuries near the joint.  A drug reaction or allergic reaction.  In some cases, the cause may not be known. What are the signs or symptoms? The main symptom of this condition is pain in the joint with movement. Other symptoms include:  Redness, swelling, or stiffness at a joint.  Warmth coming from the joint.  Fever.  Overall feeling of illness.  How is this diagnosed? This condition may be diagnosed with a physical exam and tests, including:  Blood tests.  Urine tests.  Imaging tests, such as MRI, X-rays, or a CT scan.  Sometimes, fluid is removed from a joint for  testing. How is this treated? Treatment for this condition may involve:  Treatment of the cause, if it is known.  Rest.  Raising (elevating) the joint.  Applying cold or hot packs to the joint.  Medicines to improve symptoms and reduce inflammation.  Injections of a steroid such as cortisone into the joint to help reduce pain and inflammation.  Depending on the cause of your arthritis, you may need to make lifestyle changes to reduce stress on your joint. These changes may include exercising more and losing weight. Follow these instructions at home: Medicines  Take over-the-counter and prescription medicines only as told by your health care provider.  Do not take aspirin to relieve pain if gout is suspected. Activity  Rest your joint if told by your health care provider. Rest is important when your disease is active and your joint feels painful, swollen, or stiff.  Avoid activities that make the pain worse. It is important to balance activity with rest.  Exercise your joint regularly with range-of-motion exercises as told by your health care provider. Try doing low-impact exercise, such as: ? Swimming. ? Water aerobics. ? Biking. ? Walking. Joint Care   If your joint is swollen, keep it elevated if told by your health care provider.  If your joint feels stiff in the morning, try taking a warm shower.  If directed, apply heat to the joint. If you have diabetes, do not apply heat without   permission from your health care provider. ? Put a towel between the joint and the hot pack or heating pad. ? Leave the heat on the area for 20-30 minutes.  If directed, apply ice to the joint: ? Put ice in a plastic bag. ? Place a towel between your skin and the bag. ? Leave the ice on for 20 minutes, 2-3 times per day.  Keep all follow-up visits as told by your health care provider. This is important. Contact a health care provider if:  The pain gets worse.  You have a  fever. Get help right away if:  You develop severe joint pain, swelling, or redness.  Many joints become painful and swollen.  You develop severe back pain.  You develop severe weakness in your leg.  You cannot control your bladder or bowels. This information is not intended to replace advice given to you by your health care provider. Make sure you discuss any questions you have with your health care provider. Document Released: 07/24/2004 Document Revised: 11/22/2015 Document Reviewed: 09/11/2014 Elsevier Interactive Patient Education  2018 Elsevier Inc.  

## 2018-04-20 ENCOUNTER — Other Ambulatory Visit: Payer: Self-pay | Admitting: Family Medicine

## 2018-04-20 DIAGNOSIS — M170 Bilateral primary osteoarthritis of knee: Secondary | ICD-10-CM

## 2018-04-21 ENCOUNTER — Encounter: Payer: Self-pay | Admitting: Family Medicine

## 2018-04-23 ENCOUNTER — Other Ambulatory Visit: Payer: Self-pay | Admitting: Family Medicine

## 2018-04-23 DIAGNOSIS — M170 Bilateral primary osteoarthritis of knee: Secondary | ICD-10-CM

## 2018-04-26 MED ORDER — HYDROCODONE-ACETAMINOPHEN 7.5-325 MG PO TABS: 1.0000 | ORAL_TABLET | Freq: Four times a day (QID) | ORAL | 0 refills | Status: DC | PRN

## 2018-05-16 ENCOUNTER — Other Ambulatory Visit: Payer: Self-pay | Admitting: Family Medicine

## 2018-05-17 NOTE — Telephone Encounter (Signed)
Refilled for 120 tabs (2 months) and 1refill. Requested Prescriptions  Pending Prescriptions Disp Refills  . lisinopril-hydrochlorothiazide (PRINZIDE,ZESTORETIC) 20-12.5 MG tablet [Pharmacy Med Name: LISINOPRIL-HCTZ 20-12.5 MG TAB] 120 tablet 1    Sig: TAKE 2 TABLETS BY MOUTH EVERY DAY     Cardiovascular:  ACEI + Diuretic Combos Passed - 05/16/2018  1:00 AM      Passed - Na in normal range and within 180 days    Sodium  Date Value Ref Range Status  03/10/2018 138 134 - 144 mmol/L Final         Passed - K in normal range and within 180 days    Potassium  Date Value Ref Range Status  03/10/2018 4.9 3.5 - 5.2 mmol/L Final         Passed - Cr in normal range and within 180 days    Creat  Date Value Ref Range Status  02/26/2016 0.91 (H) 0.60 - 0.88 mg/dL Final    Comment:      For patients > or = 82 years of age: The upper reference limit for Creatinine is approximately 13% higher for people identified as African-American.      Creatinine, Ser  Date Value Ref Range Status  03/10/2018 0.96 0.57 - 1.00 mg/dL Final         Passed - Ca in normal range and within 180 days    Calcium  Date Value Ref Range Status  03/10/2018 10.2 8.7 - 10.3 mg/dL Final         Passed - Patient is not pregnant      Passed - Last BP in normal range    BP Readings from Last 1 Encounters:  03/10/18 124/75         Passed - Valid encounter within last 6 months    Recent Outpatient Visits          2 months ago Controlled type 2 diabetes mellitus without complication, without long-term current use of insulin ([place])   [facility] at [place], [place]   6 months ago Controlled type 2 diabetes mellitus without complication, without long-term current use of insulin ([place])   Primary Care at [facility], [place], [place]   9 months ago Lichenoid actinic keratosis   Primary Care at [facility], [place], [place]   10 months ago Controlled type 2 diabetes mellitus without complication, without  long-term current use of insulin ([place])   Primary Care at [facility], [place], [place]   1 year ago Need for prophylactic vaccination and inoculation against influenza   Primary Care at [facility], [place], [place]      Future Appointments            In 3 months Forrest Moron, MD Primary [facility] at [place], [facility]

## 2018-07-01 ENCOUNTER — Other Ambulatory Visit: Payer: Self-pay | Admitting: Family Medicine

## 2018-07-01 DIAGNOSIS — M170 Bilateral primary osteoarthritis of knee: Secondary | ICD-10-CM

## 2018-07-04 ENCOUNTER — Other Ambulatory Visit: Payer: Self-pay | Admitting: Family Medicine

## 2018-07-04 DIAGNOSIS — M170 Bilateral primary osteoarthritis of knee: Secondary | ICD-10-CM

## 2018-07-07 ENCOUNTER — Encounter: Payer: Self-pay | Admitting: Family Medicine

## 2018-07-07 DIAGNOSIS — M170 Bilateral primary osteoarthritis of knee: Secondary | ICD-10-CM

## 2018-07-11 MED ORDER — HYDROCODONE-ACETAMINOPHEN 7.5-325 MG PO TABS: 1.0000 | ORAL_TABLET | Freq: Four times a day (QID) | ORAL | 0 refills | Status: DC | PRN

## 2018-08-09 ENCOUNTER — Encounter: Payer: Self-pay | Admitting: Family Medicine

## 2018-08-10 ENCOUNTER — Ambulatory Visit (INDEPENDENT_AMBULATORY_CARE_PROVIDER_SITE_OTHER): Payer: Medicare Other | Admitting: Family Medicine

## 2018-08-10 ENCOUNTER — Encounter: Payer: Self-pay | Admitting: Family Medicine

## 2018-08-10 VITALS — BP 124/76 | HR 78 | Resp 17 | Ht 64.0 in | Wt 179.8 lb

## 2018-08-10 DIAGNOSIS — M170 Bilateral primary osteoarthritis of knee: Secondary | ICD-10-CM | POA: Diagnosis not present

## 2018-08-10 DIAGNOSIS — H6121 Impacted cerumen, right ear: Secondary | ICD-10-CM

## 2018-08-10 DIAGNOSIS — H9193 Unspecified hearing loss, bilateral: Secondary | ICD-10-CM

## 2018-08-10 DIAGNOSIS — E7800 Pure hypercholesterolemia, unspecified: Secondary | ICD-10-CM

## 2018-08-10 DIAGNOSIS — I10 Essential (primary) hypertension: Secondary | ICD-10-CM

## 2018-08-10 DIAGNOSIS — Z7689 Persons encountering health services in other specified circumstances: Secondary | ICD-10-CM

## 2018-08-10 DIAGNOSIS — Z1329 Encounter for screening for other suspected endocrine disorder: Secondary | ICD-10-CM

## 2018-08-10 DIAGNOSIS — Z862 Personal history of diseases of the blood and blood-forming organs and certain disorders involving the immune mechanism: Secondary | ICD-10-CM

## 2018-08-10 DIAGNOSIS — R7303 Prediabetes: Secondary | ICD-10-CM

## 2018-08-10 NOTE — Patient Instructions (Addendum)
Thank you for choosing Primary Care at [facility] to be your medical home!    Jessica [name] was seen by Molli Barrows, FNP today.   Jessica [name]'s primary care provider is Scot Jun, FNP.   For the best care possible, you should try to see Molli Barrows, FNP-C whenever you come to the clinic.   We look forward to seeing you again soon!  If you have any questions about your visit today, please call us at [phone] or feel free to reach your primary care provider via [place].      Earwax Buildup, Adult The ears produce a substance called earwax that helps keep bacteria out of the ear and protects the skin in the ear canal. Occasionally, earwax can build up in the ear and cause discomfort or hearing loss. What increases the risk? This condition is more likely to develop in people who:  Are female.  Are elderly.  Naturally produce more earwax.  Clean their ears often with cotton swabs.  Use earplugs often.  Use in-ear headphones often.  Wear hearing aids.  Have narrow ear canals.  Have earwax that is overly thick or sticky.  Have eczema.  Are dehydrated.  Have excess hair in the ear canal. What are the signs or symptoms? Symptoms of this condition include:  Reduced or muffled hearing.  A feeling of fullness in the ear or feeling that the ear is plugged.  Fluid coming from the ear.  Ear pain.  Ear itch.  Ringing in the ear.  Coughing.  An obvious piece of earwax that can be seen inside the ear canal. How is this diagnosed? This condition may be diagnosed based on:  Your symptoms.  Your medical history.  An ear exam. During the exam, your health care provider will look into your ear with an instrument called an otoscope. You may have tests, including a hearing test. How is this treated? This condition may be treated by:  Using ear drops to soften the earwax.  Having the earwax removed by a health care provider. The health  care provider may: ? Flush the ear with water. ? Use an instrument that has a loop on the end (curette). ? Use a suction device.  Surgery to remove the wax buildup. This may be done in severe cases. Follow these instructions at home:   Take over-the-counter and prescription medicines only as told by your health care provider.  Do not put any objects, including cotton swabs, into your ear. You can clean the opening of your ear canal with a washcloth or facial tissue.  Follow instructions from your health care provider about cleaning your ears. Do not over-clean your ears.  Drink enough fluid to keep your urine clear or pale yellow. This will help to thin the earwax.  Keep all follow-up visits as told by your health care provider. If earwax builds up in your ears often or if you use hearing aids, consider seeing your health care provider for routine, preventive ear cleanings. Ask your health care provider how often you should schedule your cleanings.  If you have hearing aids, clean them according to instructions from the manufacturer and your health care provider. Contact a health care provider if:  You have ear pain.  You develop a fever.  You have blood, pus, or other fluid coming from your ear.  You have hearing loss.  You have ringing in your ears that does not go away.  Your symptoms do not  improve with treatment.  You feel like the room is spinning (vertigo). Summary  Earwax can build up in the ear and cause discomfort or hearing loss.  The most common symptoms of this condition include reduced or muffled hearing and a feeling of fullness in the ear or feeling that the ear is plugged.  This condition may be diagnosed based on your symptoms, your medical history, and an ear exam.  This condition may be treated by using ear drops to soften the earwax or by having the earwax removed by a health care provider.  Do not put any objects, including cotton swabs, into your  ear. You can clean the opening of your ear canal with a washcloth or facial tissue. This information is not intended to replace advice given to you by your health care provider. Make sure you discuss any questions you have with your health care provider. Document Released: 07/24/2004 Document Revised: 05/28/2017 Document Reviewed: 08/27/2016 Elsevier Interactive Patient Education  2019 [employer].

## 2018-08-10 NOTE — Progress Notes (Signed)
Jessica [name], is a 85 y.o. female  OEV:[id number]  [id number]:[id number]  DOB - May 16, 1930  CC:  Chief Complaint  Patient presents with  . Establish Care  . Ear Fullness    went to [facility] for a follow up on her hearing aids. was told that she has cerumen buildup in R ear       HPI: Jessica [name] is a 85 y.o. female is here today to establish care.   [name] has COLONIC POLYPS, ADENOMATOUS; Hyperlipidemia; HTN (hypertension); GERD (gastroesophageal reflux disease); Cystocele; Low bone mass; Diabetes type 2, controlled ([place]); Osteoarthritis; Lymphocytic colitis; Overactive bladder; Impaired hearing; Glaucoma; Cataracts, bilateral; Constipation; Back pain; BMI 30.0-30.9,adult; Chronic [place] ulcers; Hiatal hernia - 1o cm; and History of iron deficiency anemia on their problem list.    Today's visit:  Patient is here to establish care today and for a right ear irrigation.  Patient was recently seen for evaluation of gradually worsening of right ear hearing loss. She was seen at [facility] and was found to have cerumen impaction of the right ear. No prior cerumen impactions.    Chronic conditions include bilateral knee pain-currently prescribed chronic Norco for which she takes when pain is severe. Suffers from osteoarthritis and has a history of right hip replacement.    Diabetes?Preadiabetes  Last A1C, 5.9 in September 2019. No prior medication treatment. Diet controlled. No family history. Asymptomatic of 3 P's .  Essential Hypertension Consistent home blood pressure monitoring. Readings at home have ranged 938-101 (systolic) 75-10 (diastolic) No history of CAD, CHF, or MI. Endorse low sodium diet. Due to impaired mobility, admits to lack of physical activity. Denies chest pain, shortness of breath, headaches, dizziness.  Iron Deficiency  Reports a history of iron deficiency due to a bleeding ulcer.  No recent iron panel eval.  She also endorses some chronic constipation which she  treats with Metamucil and dietary changes.  Endorses good water intake daily.  Denies any bloody stools.  Current up-to-date with colonoscopy.   Current medications: Current Outpatient Medications:  .  amLODipine (NORVASC) 10 MG tablet, Take 1 tablet (10 mg total) by mouth daily., Disp: 90 tablet, Rfl: 3 .  aspirin 81 MG tablet, Take 81 mg by mouth daily., Disp: , Rfl:  .  Biotin 1 MG CAPS, Take by mouth., Disp: , Rfl:  .  cycloSPORINE (RESTASIS) 0.05 % ophthalmic emulsion, Place 1 drop into both eyes 2 (two) times daily., Disp: , Rfl:  .  esomeprazole (NEXIUM) 20 MG capsule, Take 1 capsule (20 mg total) by mouth daily at 12 noon., Disp: 90 capsule, Rfl: 3 .  ferrous sulfate 325 (65 FE) MG tablet, TAKE 1 TABLET (325 MG TOTAL) BY MOUTH 2 (TWO) TIMES DAILY WITH A MEAL., Disp: 180 tablet, Rfl: 3 .  GLUCOSAMINE PO, Take by mouth., Disp: , Rfl:  .  HYDROcodone-acetaminophen (NORCO) 7.5-325 MG tablet, Take 1 tablet by mouth every 6 (six) hours as needed., Disp: 30 tablet, Rfl: 0 .  lisinopril-hydrochlorothiazide (PRINZIDE,ZESTORETIC) 20-12.5 MG tablet, TAKE 2 TABLETS BY MOUTH EVERY DAY, Disp: 120 tablet, Rfl: 1 .  metoprolol succinate (TOPROL-XL) 25 MG 24 hr tablet, Take 1 tablet (25 mg total) by mouth daily. Keep on file. Pt not due for refill., Disp: 90 tablet, Rfl: 3 .  Multiple Vitamins-Minerals (MULTIVITAMIN WITH MINERALS) tablet, Take 1 tablet by mouth daily., Disp: , Rfl:  .  pravastatin (PRAVACHOL) 20 MG tablet, Take 1 tablet (20 mg total) by mouth every evening., Disp: 90 tablet, Rfl: 3  Pertinent family medical history: family history includes Arthritis in her daughter; Heart disease (age of onset: 71) in her father; Hypertension in her mother; Obesity in her daughter; Rheumatic fever in her father; Stroke (age of onset: 20) in her mother.   No Known Allergies  Social History   Socioeconomic History  . Marital status: Widowed    Spouse name: n/a  . Number of children: 2  . Years of  education: 11th grade  . Highest education level: Not on file  Occupational History  . Occupation: Retired Glass blower/designer  Social Needs  . Financial resource strain: Not on file  . Food insecurity:    Worry: Not on file    Inability: Not on file  . Transportation needs:    Medical: Not on file    Non-medical: Not on file  Tobacco Use  . Smoking status: Former Smoker    Types: Cigarettes    Last attempt to quit: 07/07/1995    Years since quitting: 23.1  . Smokeless tobacco: Never Used  Substance and Sexual Activity  . Alcohol use: No  . Drug use: No  . Sexual activity: Never  Lifestyle  . Physical activity:    Days per week: Not on file    Minutes per session: Not on file  . Stress: Not on file  Relationships  . Social connections:    Talks on phone: Not on file    Gets together: Not on file    Attends religious service: Not on file    Active member of club or organization: Not on file    Attends meetings of clubs or organizations: Not on file    Relationship status: Not on file  . Intimate partner violence:    Fear of current or ex partner: Not on file    Emotionally abused: Not on file    Physically abused: Not on file    Forced sexual activity: Not on file  Other Topics Concern  . Not on file  Social History Narrative   Widowed - retired Hospital doctor   Daughter (a retired Forensic psychologist) lives with patient - she has medical problems related to obesity.   Her son and his family live across the street.   2 caffeine drinks/day   10/31/2015    Review of Systems: Pertinent negatives listed in HPI  Objective:   Vitals:   08/10/18 0959  BP: (!) 153/74  Pulse: 78  Resp: 17  SpO2: 96%    BP Readings from Last 3 Encounters:  08/10/18 (!) 153/74  03/10/18 124/75  11/13/17 130/62    Filed Weights   08/10/18 0959  Weight: 179 lb 12.8 oz (81.6 kg)      Physical Exam: Constitutional: Patient appears well-developed and well-nourished. No  distress. HENT: Normocephalic, atraumatic, External right and left ear normal. Right TM occluded with cerumen  Eyes: Conjunctivae and EOM are normal. PERRLA, no scleral icterus. Neck: Normal ROM. Neck supple. No JVD. No tracheal deviation. No thyromegaly. CVS: RRR, S1/S2 +, no murmurs, no gallops, no carotid bruit.  Pulmonary: Effort and breath sounds normal, no stridor, rhonchi, wheezes, rales.  Abdominal: Soft. BS +, no distension, tenderness, rebound or guarding.  Musculoskeletal: Normal range of motion. No edema and no tenderness.  Neuro: Alert. Normal muscle tone coordination. Gait impaired-uses cane.  Skin: Skin is warm and dry. No rash noted. Not diaphoretic. No erythema. No pallor. Psychiatric: Normal mood and affect. Behavior, judgment, thought content normal.  Lab Results (prior encounters)  Lab Results  Component Value Date   WBC 9.1 11/13/2017   HGB 12.1 11/13/2017   HCT 36.4 11/13/2017   MCV 91 11/13/2017   PLT 281 11/13/2017   Lab Results  Component Value Date   CREATININE 0.96 03/10/2018   BUN 28 (H) 03/10/2018   NA 138 03/10/2018   K 4.9 03/10/2018   CL 101 03/10/2018   CO2 19 (L) 03/10/2018    Lab Results  Component Value Date   HGBA1C 5.9 (H) 03/10/2018       Component Value Date/Time   CHOL 135 11/13/2017 1202   TRIG 165 (H) 11/13/2017 1202   HDL 44 11/13/2017 1202   CHOLHDL 3.1 11/13/2017 1202   CHOLHDL 3.3 02/26/2016 1043   VLDL 31 (H) 02/26/2016 1043   LDLCALC 58 11/13/2017 1202        Assessment and plan:  1. Encounter to establish care  2. Primary osteoarthritis of both knees -Patient but has been prescribed chronic hydrocodone-acetaminophen for some time.  We discussed at length her current chronic medication policy here in office.  We agreed to start her on Tylenol 3's with a quantity of 30/month as needed for bilateral knee pain.  Patient signed a medication contract while here in office.  She understands that hydrocodone-acetaminophen  will not be continued after her current prescription. 3. Bilateral hearing loss, unspecified hearing loss type -Continue use of hearing aids  4. Impacted cerumen of right ear -Successful right ear irrigation performed.  Patient tolerated procedure.  5. Essential hypertension, elevated initially. Recheck of BP, reading was consistent with home readings.   No changes in medication therapy today will monitor during subsequent visits.  6. History of iron deficiency anemia - Iron, TIBC and Ferritin Panel - CBC with Differential  7. Prediabetes Controlled over the last few years.  No recent A1c was 5.9.  Prior medication management.   Will repeat the following:  - Comprehensive metabolic panel - Hemoglobin A1c  8. Hypercholesteremia - Lipid panel  9. Screening for thyroid disorder - Thyroid Panel With TSH Return for follow-up in 3 months for medication management and chronic conditions.  The patient was given clear instructions to go to ER or return to medical center if symptoms don't improve, worsen or new problems develop. The patient verbalized understanding. The patient was advised  to call and obtain lab results if they haven't heard anything from out office within 7-10 business days.  Molli Barrows, FNP Primary Care at [facility] [address] [place] [address] [phone]: [phone]    This note has been created with Dragon speech recognition software and Engineer, materials. Any transcriptional errors are unintentional.

## 2018-08-11 LAB — CBC WITH DIFFERENTIAL/PLATELET
Basophils Absolute: 0.1 10*3/uL (ref 0.0–0.2)
Basos: 1 %
EOS (ABSOLUTE): 0.1 10*3/uL (ref 0.0–0.4)
Eos: 1 %
Hematocrit: 37.5 % (ref 34.0–46.6)
Hemoglobin: 12.4 g/dL (ref 11.1–15.9)
Immature Grans (Abs): 0.1 10*3/uL (ref 0.0–0.1)
Immature Granulocytes: 1 %
LYMPHS ABS: 2.8 10*3/uL (ref 0.7–3.1)
Lymphs: 28 %
MCH: 30.3 pg (ref 26.6–33.0)
MCHC: 33.1 g/dL (ref 31.5–35.7)
MCV: 92 fL (ref 79–97)
Monocytes Absolute: 0.7 10*3/uL (ref 0.1–0.9)
Monocytes: 7 %
Neutrophils Absolute: 6.2 10*3/uL (ref 1.4–7.0)
Neutrophils: 62 %
Platelets: 306 10*3/uL (ref 150–450)
RBC: 4.09 x10E6/uL (ref 3.77–5.28)
RDW: 13.9 % (ref 11.7–15.4)
WBC: 9.9 10*3/uL (ref 3.4–10.8)

## 2018-08-11 LAB — THYROID PANEL WITH TSH
Free Thyroxine Index: 3.1 (ref 1.2–4.9)
T3 Uptake Ratio: 30 % (ref 24–39)
T4, Total: 10.3 ug/dL (ref 4.5–12.0)
TSH: 1.48 u[IU]/mL (ref 0.450–4.500)

## 2018-08-11 LAB — COMPREHENSIVE METABOLIC PANEL
ALK PHOS: 83 IU/L (ref 39–117)
ALT: 14 IU/L (ref 0–32)
AST: 21 IU/L (ref 0–40)
Albumin/Globulin Ratio: 1.5 (ref 1.2–2.2)
Albumin: 4.8 g/dL — ABNORMAL HIGH (ref 3.6–4.6)
BUN/Creatinine Ratio: 23 (ref 12–28)
BUN: 24 mg/dL (ref 8–27)
Bilirubin Total: 0.2 mg/dL (ref 0.0–1.2)
CO2: 20 mmol/L (ref 20–29)
Calcium: 10.4 mg/dL — ABNORMAL HIGH (ref 8.7–10.3)
Chloride: 101 mmol/L (ref 96–106)
Creatinine, Ser: 1.04 mg/dL — ABNORMAL HIGH (ref 0.57–1.00)
GFR calc Af Amer: 56 mL/min/{1.73_m2} — ABNORMAL LOW (ref >59)
GFR calc non Af Amer: 49 mL/min/{1.73_m2} — ABNORMAL LOW (ref >59)
Globulin, Total: 3.2 g/dL (ref 1.5–4.5)
Glucose: 104 mg/dL — ABNORMAL HIGH (ref 65–99)
Potassium: 5.2 mmol/L (ref 3.5–5.2)
Sodium: 141 mmol/L (ref 134–144)
Total Protein: 8 g/dL (ref 6.0–8.5)

## 2018-08-11 LAB — IRON,TIBC AND FERRITIN PANEL
Ferritin: 71 ng/mL (ref 15–150)
IRON SATURATION: 25 % (ref 15–55)
IRON: 79 ug/dL (ref 27–139)
Total Iron Binding Capacity: 311 ug/dL (ref 250–450)
UIBC: 232 ug/dL (ref 118–369)

## 2018-08-11 LAB — HEMOGLOBIN A1C
Est. average glucose Bld gHb Est-mCnc: 117 mg/dL
Hgb A1c MFr Bld: 5.7 % — ABNORMAL HIGH (ref 4.8–5.6)

## 2018-08-11 LAB — LIPID PANEL
Chol/HDL Ratio: 3.4 ratio (ref 0.0–4.4)
Cholesterol, Total: 160 mg/dL (ref 100–199)
HDL: 47 mg/dL (ref >39)
LDL Calculated: 79 mg/dL (ref 0–99)
Triglycerides: 172 mg/dL — ABNORMAL HIGH (ref 0–149)
VLDL Cholesterol Cal: 34 mg/dL (ref 5–40)

## 2018-08-20 ENCOUNTER — Other Ambulatory Visit: Payer: Medicare Other

## 2018-09-08 ENCOUNTER — Ambulatory Visit: Payer: Medicare Other | Admitting: Family Medicine

## 2018-09-18 ENCOUNTER — Other Ambulatory Visit: Payer: Self-pay | Admitting: Family Medicine

## 2018-11-08 ENCOUNTER — Ambulatory Visit: Payer: Medicare Other | Admitting: Family Medicine

## 2018-12-12 ENCOUNTER — Other Ambulatory Visit: Payer: Self-pay | Admitting: Family Medicine

## 2018-12-13 ENCOUNTER — Encounter: Payer: Self-pay | Admitting: Family Medicine

## 2018-12-13 NOTE — Telephone Encounter (Signed)
Patient needs BP/Chol f/up

## 2018-12-13 NOTE — Telephone Encounter (Signed)
Patient needs an appointment for additional refill. Refilled for 30 days. Her prior appt was cancelled due to [place]. Needs to be rescheduled for an in person appointment.

## 2018-12-22 ENCOUNTER — Telehealth: Payer: Self-pay

## 2018-12-22 NOTE — Telephone Encounter (Signed)

## 2018-12-23 ENCOUNTER — Encounter: Payer: Self-pay | Admitting: Family Medicine

## 2018-12-23 ENCOUNTER — Other Ambulatory Visit: Payer: Self-pay

## 2018-12-23 ENCOUNTER — Ambulatory Visit (INDEPENDENT_AMBULATORY_CARE_PROVIDER_SITE_OTHER): Payer: Medicare Other | Admitting: Family Medicine

## 2018-12-23 VITALS — BP 145/75 | HR 82 | Temp 97.3°F | Resp 17 | Ht 63.0 in | Wt 177.6 lb

## 2018-12-23 DIAGNOSIS — E119 Type 2 diabetes mellitus without complications: Secondary | ICD-10-CM | POA: Diagnosis not present

## 2018-12-23 DIAGNOSIS — G8929 Other chronic pain: Secondary | ICD-10-CM

## 2018-12-23 DIAGNOSIS — M5441 Lumbago with sciatica, right side: Secondary | ICD-10-CM

## 2018-12-23 DIAGNOSIS — M170 Bilateral primary osteoarthritis of knee: Secondary | ICD-10-CM | POA: Diagnosis not present

## 2018-12-23 DIAGNOSIS — I10 Essential (primary) hypertension: Secondary | ICD-10-CM | POA: Diagnosis not present

## 2018-12-23 DIAGNOSIS — E7800 Pure hypercholesterolemia, unspecified: Secondary | ICD-10-CM

## 2018-12-23 MED ORDER — LISINOPRIL-HYDROCHLOROTHIAZIDE 20-12.5 MG PO TABS: 2.0000 | ORAL_TABLET | Freq: Every day | ORAL | 1 refills | Status: DC

## 2018-12-23 MED ORDER — ACETAMINOPHEN-CODEINE #3 300-30 MG PO TABS: 1.0000 | ORAL_TABLET | ORAL | 0 refills | Status: AC | PRN

## 2018-12-23 NOTE — Patient Instructions (Signed)
Please follow-up in 3 months for reevaluation of pain and pain medication treatment.  Blood pressure elevated today although home readings have been very well controlled.  Continue home monitoring of blood pressure although no medication changes indicated today.  We will notify you of labs via my chart.    Hypertension Hypertension, commonly called high blood pressure, is when the force of blood pumping through the arteries is too strong. The arteries are the blood vessels that carry blood from the heart throughout the body. Hypertension forces the heart to work harder to pump blood and may cause arteries to become narrow or stiff. Having untreated or uncontrolled hypertension can cause heart attacks, strokes, kidney disease, and other problems. A blood pressure reading consists of a higher number over a lower number. Ideally, your blood pressure should be below 120/80. The first ("top") number is called the systolic pressure. It is a measure of the pressure in your arteries as your heart beats. The second ("bottom") number is called the diastolic pressure. It is a measure of the pressure in your arteries as the heart relaxes. What are the causes? The cause of this condition is not known. What increases the risk? Some risk factors for high blood pressure are under your control. Others are not. Factors you can change  Smoking.  Having type 2 diabetes mellitus, high cholesterol, or both.  Not getting enough exercise or physical activity.  Being overweight.  Having too much fat, sugar, calories, or salt (sodium) in your diet.  Drinking too much alcohol. Factors that are difficult or impossible to change  Having chronic kidney disease.  Having a family history of high blood pressure.  Age. Risk increases with age.  Race. You may be at higher risk if you are African-American.  Gender. Men are at higher risk than women before age 9. After age 66, women are at higher risk than men.   Having obstructive sleep apnea.  Stress. What are the signs or symptoms? Extremely high blood pressure (hypertensive crisis) may cause:  Headache.  Anxiety.  Shortness of breath.  Nosebleed.  Nausea and vomiting.  Severe chest pain.  Jerky movements you cannot control (seizures). How is this diagnosed? This condition is diagnosed by measuring your blood pressure while you are seated, with your arm resting on a surface. The cuff of the blood pressure monitor will be placed directly against the skin of your upper arm at the level of your heart. It should be measured at least twice using the same arm. Certain conditions can cause a difference in blood pressure between your right and left arms. Certain factors can cause blood pressure readings to be lower or higher than normal (elevated) for a short period of time:  When your blood pressure is higher when you are in a health care provider's office than when you are at home, this is called white coat hypertension. Most people with this condition do not need medicines.  When your blood pressure is higher at home than when you are in a health care provider's office, this is called masked hypertension. Most people with this condition may need medicines to control blood pressure. If you have a high blood pressure reading during one visit or you have normal blood pressure with other risk factors:  You may be asked to return on a different day to have your blood pressure checked again.  You may be asked to monitor your blood pressure at home for 1 week or longer. If you are diagnosed  with hypertension, you may have other blood or imaging tests to help your health care provider understand your overall risk for other conditions. How is this treated? This condition is treated by making healthy lifestyle changes, such as eating healthy foods, exercising more, and reducing your alcohol intake. Your health care provider may prescribe medicine if  lifestyle changes are not enough to get your blood pressure under control, and if:  Your systolic blood pressure is above 130.  Your diastolic blood pressure is above 80. Your personal target blood pressure may vary depending on your medical conditions, your age, and other factors. Follow these instructions at home: Eating and drinking   Eat a diet that is high in fiber and potassium, and low in sodium, added sugar, and fat. An example eating plan is called the DASH (Dietary Approaches to Stop Hypertension) diet. To eat this way: ? Eat plenty of fresh fruits and vegetables. Try to fill half of your plate at each meal with fruits and vegetables. ? Eat whole grains, such as whole wheat pasta, brown rice, or whole grain bread. Fill about one quarter of your plate with whole grains. ? Eat or drink low-fat dairy products, such as skim milk or low-fat yogurt. ? Avoid fatty cuts of meat, processed or cured meats, and poultry with skin. Fill about one quarter of your plate with lean proteins, such as fish, chicken without skin, beans, eggs, and tofu. ? Avoid premade and processed foods. These tend to be higher in sodium, added sugar, and fat.  Reduce your daily sodium intake. Most people with hypertension should eat less than 1,500 mg of sodium a day.  Limit alcohol intake to no more than 1 drink a day for nonpregnant women and 2 drinks a day for men. One drink equals 12 oz of beer, 5 oz of wine, or 1 oz of hard liquor. Lifestyle   Work with your health care provider to maintain a healthy body weight or to lose weight. Ask what an ideal weight is for you.  Get at least 30 minutes of exercise that causes your heart to beat faster (aerobic exercise) most days of the week. Activities may include walking, swimming, or biking.  Include exercise to strengthen your muscles (resistance exercise), such as pilates or lifting weights, as part of your weekly exercise routine. Try to do these types of  exercises for 30 minutes at least 3 days a week.  Do not use any products that contain nicotine or tobacco, such as cigarettes and e-cigarettes. If you need help quitting, ask your health care provider.  Monitor your blood pressure at home as told by your health care provider.  Keep all follow-up visits as told by your health care provider. This is important. Medicines  Take over-the-counter and prescription medicines only as told by your health care provider. Follow directions carefully. Blood pressure medicines must be taken as prescribed.  Do not skip doses of blood pressure medicine. Doing this puts you at risk for problems and can make the medicine less effective.  Ask your health care provider about side effects or reactions to medicines that you should watch for. Contact a health care provider if:  You think you are having a reaction to a medicine you are taking.  You have headaches that keep coming back (recurring).  You feel dizzy.  You have swelling in your ankles.  You have trouble with your vision. Get help right away if:  You develop a severe headache or confusion.  You have unusual weakness or numbness.  You feel faint.  You have severe pain in your chest or abdomen.  You vomit repeatedly.  You have trouble breathing. Summary  Hypertension is when the force of blood pumping through your arteries is too strong. If this condition is not controlled, it may put you at risk for serious complications.  Your personal target blood pressure may vary depending on your medical conditions, your age, and other factors. For most people, a normal blood pressure is less than 120/80.  Hypertension is treated with lifestyle changes, medicines, or a combination of both. Lifestyle changes include weight loss, eating a healthy, low-sodium diet, exercising more, and limiting alcohol. This information is not intended to replace advice given to you by your health care provider.  Make sure you discuss any questions you have with your health care provider. Document Released: 06/16/2005 Document Revised: 05/14/2016 Document Reviewed: 05/14/2016 Elsevier Interactive Patient Education  2019 [employer]  Chronic Pain, Adult Chronic pain is a type of pain that lasts or keeps coming back (recurs) for at least six months. You may have chronic headaches, abdominal pain, or body pain. Chronic pain may be related to an illness, such as fibromyalgia or complex regional pain syndrome. Sometimes the cause of chronic pain is not known. Chronic pain can make it hard for you to do daily activities. If not treated, chronic pain can lead to other health problems, including anxiety and depression. Treatment depends on the cause and severity of your pain. You may need to work with a pain specialist to come up with a treatment plan. The plan may include medicine, counseling, and physical therapy. Many people benefit from a combination of two or more types of treatment to control their pain. Follow these instructions at home: Lifestyle  Consider keeping a pain diary to share with your health care providers.  Consider talking with a mental health care provider (psychologist) about how to cope with chronic pain.  Consider joining a chronic pain support group.  Try to control or lower your stress levels. Talk to your health care provider about strategies to do this. General instructions   Take over-the-counter and prescription medicines only as told by your health care provider.  Follow your treatment plan as told by your health care provider. This may include: ? Gentle, regular exercise. ? Eating a healthy diet that includes foods such as vegetables, fruits, fish, and lean meats. ? Cognitive or behavioral therapy. ? Working with a Community education officer. ? Meditation or yoga. ? Acupuncture or massage therapy. ? Aroma, color, light, or sound therapy. ? Local electrical stimulation. ?  Shots (injections) of numbing or pain-relieving medicines into the spine or the area of pain.  Check your pain level as told by your health care provider. Ask your health care provider if you should use a pain scale.  Learn as much as you can about how to manage your chronic pain. Ask your health care provider if an intensive pain rehabilitation program or a chronic pain specialist would be helpful.  Keep all follow-up visits as told by your health care provider. This is important. Contact a health care provider if:  Your pain gets worse.  You have new pain.  You have trouble sleeping.  You have trouble doing your normal activities.  Your pain is not controlled with treatment.  Your have side effects from pain medicine.  You feel weak. Get help right away if:  You lose feeling or have numbness in your  body.  You lose control of bowel or bladder function.  Your pain suddenly gets much worse.  You develop shaking or chills.  You develop confusion.  You develop chest pain.  You have trouble breathing or shortness of breath.  You pass out.  You have thoughts about hurting yourself or others. This information is not intended to replace advice given to you by your health care provider. Make sure you discuss any questions you have with your health care provider. Document Released: 03/08/2002 Document Revised: 02/14/2016 Document Reviewed: 12/04/2015 Elsevier Interactive Patient Education  2019 [employer].  Opioid Pain Medicine Information  Opioids are powerful medicines that are used to treat moderate to severe pain. Opioids should be taken with the supervision of a trained health care provider. They should be taken for the shortest period of time as possible. This is because opioids can be addictive and the longer you take opioids, the greater your risk of addiction (opioid use disorder). What do opioids do? Opioids help to reduce or eliminate pain. When used for short  periods of time, they can help you:  Sleep better.  Do better in physical or occupational therapy.  Feel better in the first few days after an injury.  Recover from surgery. What is a pain treatment plan? A pain treatment plan is an agreement between you and your health care provider. Pain is unique to each person, and treatments vary depending on your condition. To manage your pain successfully, you and your health care provider need to understand each other and work together. To help you do this:  Discuss the goals of your treatment, including how much pain you might expect to have and how you will manage the pain.  Review the risks and benefits of taking opioid medicines for your condition.  Remember that a good treatment plan uses more than one approach and minimizes the chance of side effects.  Be honest about the amount of medicines you take, and about any drug or alcohol use.  Get pain medicine prescriptions from only one care provider.  Keep all follow-up visits as told by your health care provider. This is important. What instructions should I follow while taking opioid pain medicine?     While you are taking opioid pain medicines, follow these instructions:  Do not drive.  Do not use machinery or power tools.  Do not sign legal documents.  Do not drink alcohol.  Do not take sleeping pills.  Do not supervise children by yourself.  Do not participate in activities that require climbing or being in high places.  Do not enter a body of water-such as a lake, river, ocean, spa, or swimming pool-unless an adult is nearby who can monitor and help you. What kinds of side effects can opioids cause? Opioids can cause side effects, such as:  Constipation.  Nausea.  Vomiting.  Drowsiness.  Confusion.  Opioid use disorder.  Breathing difficulties (respiratory depression). Using opioid pain medicines for longer than 3 days increases your risk of these side  effects. Taking opioid pain medicine for a long period of time can affect your ability to do daily tasks. It also puts you at risk for:  Motor vehicle accidents.  Depression.  Suicide.  Heart attack.  Overdose, which can sometimes lead to death. What are alternative ways to manage pain? Pain can be managed with many types of alternative treatments. Ask your health care provider to refer you to one or more specialists who can help you manage  pain through:  Physical or occupational therapy.  Counseling (cognitive behavioral therapy).  Good nutrition.  Biofeedback.  Massage.  Meditation.  Non-opioid medicine.  Following a gentle exercise program. How can I keep others safe while I am taking opioid pain medicine?  Keep pain medicine in a locked cabinet, or in a secure area where children cannot reach it.  Never share your pain medicine with anyone.  Do not save any leftover pills. If you have leftover medicine, you can: ? Bring the medicine to a prescription take-back program. This is usually offered by the county or Event organiser. ? Bring it to a pharmacy that has a drug disposal container. ? Throw it out in the trash. Check the label or package insert of your medicine to see whether this is safe to do. If it is safe to throw it out, remove the medicine from the container and mix it with food or pet waste before putting it in the trash. How do I stop taking opioids if I have been taking them for a long time? If you have been taking opioid medicine for more than a few weeks, you may need to slowly decrease (taper) how much you take until you stop completely. Tapering your use of opioids can decrease your chances of experiencing withdrawal symptoms, such as:  Pain and cramping in the abdomen.  Nausea.  Sweating.  Sleepiness.  Restlessness.  Uncontrollable shaking (tremors).  Cravings for the medicine. Do not attempt to taper your use of opioids on your own. Talk  with your health care provider about how to do this. Your health care provider may prescribe a step-down schedule based on how much medicine you are taking and how long you have been taking it. Where to find support If you have been taking opioids for a long time, you may benefit from receiving support for quitting from a local support group or counselor. Ask your health care provider for a referral to these resources in your area. Where to find more information  Centers for Disease Control and Prevention (CDC): [employer] Get help right away if: Seek medical care right away if you are taking opioids and you (or people close to you) notice any of the following:  Difficulty breathing.  Breathing that is slower or more shallow than normal.  A very slow heartbeat (pulse).  Severe confusion.  Unconsciousness.  Sleepiness.  Slurred speech.  Nausea and vomiting.  Cold, clammy skin.  Blue lips or fingernails.  Limpness.  Abnormally small pupils. If you think that you or someone else may have taken too much of an opioid medicine, get medical help right away. Do not wait to see if the symptoms go away on their own.  If you ever feel like you may hurt yourself or others, or have thoughts about taking your own life, get help right away. You can go to your nearest emergency department or call:  Your local emergency services (911 in the [place]  The hotline of the [facility] 404-082-7040 in the [place]  A suicide crisis helpline, such as the [facility] at 254-795-2506. This is open 24 hours a day. Summary  Opioid medicines can help you manage moderate to severe pain for a short period of time.  Discuss the goals of your treatment with your health care provider, including how much pain you might expect to have and how you will manage the pain.  A good treatment plan uses more than one approach. Pain can  be managed with many types of alternative treatments.  If you think that you or someone else may have taken too much of an opioid, get medical help right away. This information is not intended to replace advice given to you by your health care provider. Make sure you discuss any questions you have with your health care provider. Document Released: 07/13/2015 Document Revised: 04/06/2017 Document Reviewed: 01/26/2015 Elsevier Interactive Patient Education  2019 [employer].

## 2018-12-23 NOTE — Progress Notes (Signed)
Patient ID: Jessica [name], female    DOB: 1930-03-18, 85 y.o.   MRN: [id number]  PCP: Jessica Jun, FNP  Chief Complaint  Patient presents with  . Hypertension  . Hyperlipidemia    Subjective:  HPI  Jessica [name] is a 85 y.o. female presents for hypertension, hyperlipidemia and chronic pain follow-up.  Hypertension/Hyperlipidemia Patient has historically had well-controlled blood pressure on current regimen.  She also suffers from hyperlipidemia which she is currently prescribed pravastatin 20 mg once daily.  She monitors blood pressure at home and brings with her today her blood pressure readings which have ranged in 120-130/<80. BP slightly elevated today.  Due to COVID-19 patient has remained isolated at home over the last several months.  Therefore she has been relatively inactive.  She endorses low-sodium diet. Current blood pressure regimen consist of lisinopril HCTZ, metoprolol and amlodipine. Patient denies chest pain, shortness of breath, weakness, dizziness or headache.   Chronic pain associated with arthritis Patient establish care here at [facility] earlier this year.  Her previous PCP had prescribed her Norco for chronic pain.  During her initial visit patient was advised that I would not continue the Norco due to potential for dependency as well as given her age this places her at higher risk adverse reactions associated opioids (eg falls and hypotension).  We agreed at that time to transition to acetaminophen with codeine.  Patient agreed with this change.  She is in today for initial prescription.  She does not take medication for pain every day.  She mostly takes medication if she is active or the weather changes which is when her arthritis pain is more active. Social History   Socioeconomic History  . Marital status: Widowed    Spouse name: n/a  . Number of children: 2  . Years of education: 11th grade  . Highest education level: Not on file  Occupational  History  . Occupation: Retired Glass blower/designer  Social Needs  . Financial resource strain: Not on file  . Food insecurity    Worry: Not on file    Inability: Not on file  . Transportation needs    Medical: Not on file    Non-medical: Not on file  Tobacco Use  . Smoking status: Former Smoker    Types: Cigarettes    Quit date: 07/07/1995    Years since quitting: 23.4  . Smokeless tobacco: Never Used  Substance and Sexual Activity  . Alcohol use: No  . Drug use: No  . Sexual activity: Never  Lifestyle  . Physical activity    Days per week: Not on file    Minutes per session: Not on file  . Stress: Not on file  Relationships  . Social Herbalist on phone: Not on file    Gets together: Not on file    Attends religious service: Not on file    Active member of club or organization: Not on file    Attends meetings of clubs or organizations: Not on file    Relationship status: Not on file  . Intimate partner violence    Fear of current or ex partner: Not on file    Emotionally abused: Not on file    Physically abused: Not on file    Forced sexual activity: Not on file  Other Topics Concern  . Not on file  Social History Narrative   Widowed - retired Hospital doctor   Daughter (a retired Forensic psychologist) lives with  patient - she has medical problems related to obesity.   Her son and his family live across the street.   2 caffeine drinks/day   10/31/2015    Family History  Problem Relation Age of Onset  . Stroke Mother 36  . Hypertension Mother   . Heart disease Father 72       Endocarditis  . Rheumatic fever Father   . Obesity Daughter        unable to walk very far  . Arthritis Daughter   . Anesthesia problems Neg Hx   . Hypotension Neg Hx   . Pseudochol deficiency Neg Hx   . Malignant hyperthermia Neg Hx   . Colon cancer Neg Hx   . Esophageal cancer Neg Hx   . Pancreatic cancer Neg Hx   . Rectal cancer Neg Hx   . Stomach cancer Neg Hx       Review of Systems Pertinent negatives listed in HPI Patient Active Problem List   Diagnosis Date Noted  . History of iron deficiency anemia 02/16/2016  . 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 Hiatal hernia - 1o cm 11/20/2015  . BMI 30.0-30.9,adult 11/14/2014  . Lymphocytic colitis   . Overactive bladder   . Impaired hearing   . Glaucoma   . Cataracts, bilateral   . Constipation   . Back pain   . GERD (gastroesophageal reflux disease)   . Cystocele   . Low bone mass   . Diabetes type 2, controlled ([place])   . Osteoarthritis   . COLONIC POLYPS, ADENOMATOUS 09/09/2007  . Hyperlipidemia 09/09/2007  . HTN (hypertension) 09/09/2007    No Known Allergies  Prior to Admission medications   Medication Sig Start Date End Date Taking? Authorizing Provider  acetaminophen-codeine (TYLENOL #3) 300-30 MG tablet Take 1 tablet by mouth every 4 (four) hours as needed for moderate pain. 12/23/18 03/23/19  Jessica Jun, FNP  amLODipine (NORVASC) 10 MG tablet Take 1 tablet (10 mg total) by mouth daily. 03/10/18   Jessica Moron, MD  aspirin 81 MG tablet Take 81 mg by mouth daily.    [provider]  Biotin 1 MG CAPS Take by mouth.    [provider]  cycloSPORINE (RESTASIS) 0.05 % ophthalmic emulsion Place 1 drop into both eyes 2 (two) times daily.    [provider]  esomeprazole (NEXIUM) 20 MG capsule Take 1 capsule (20 mg total) by mouth daily at 12 noon. 03/10/18   Jessica Moron, MD  ferrous sulfate 325 (65 FE) MG tablet TAKE 1 TABLET (325 MG TOTAL) BY MOUTH 2 (TWO) TIMES DAILY WITH A MEAL. 10/30/16   Jessica Mayer, MD  GLUCOSAMINE PO Take by mouth.    [provider]  HYDROcodone-acetaminophen (NORCO) 7.5-325 MG tablet Take 1 tablet by mouth every 6 (six) hours as needed. 07/11/18   Jessica Moron, MD  lisinopril-hydrochlorothiazide (ZESTORETIC) 20-12.5 MG tablet Take 2 tablets by mouth daily. 12/23/18   Jessica Jun, FNP  metoprolol  succinate (TOPROL-XL) 25 MG 24 hr tablet Take 1 tablet (25 mg total) by mouth daily. Keep on file. Pt not due for refill. 03/10/18   Jessica Moron, MD  Multiple Vitamins-Minerals (MULTIVITAMIN WITH MINERALS) tablet Take 1 tablet by mouth daily.    [provider]  pravastatin (PRAVACHOL) 20 MG tablet Take 1 tablet (20 mg total) by mouth every evening. 03/10/18   Jessica Moron, MD    Past Medical, Surgical Family and Social History reviewed  and updated.    Objective:   Today's Vitals   12/23/18 0838  BP: (!) 145/75  Pulse: 82  Resp: 17  Temp: (!) 97.3 F (36.3 C)  TempSrc: Temporal  SpO2: 98%  Weight: 177 lb 9.6 oz (80.6 kg)  Height: 5\' 3"  (1.6 m)    Wt Readings from Last 3 Encounters:  12/23/18 177 lb 9.6 oz (80.6 kg)  08/10/18 179 lb 12.8 oz (81.6 kg)  03/10/18 179 lb 6.4 oz (81.4 kg)     Physical Exam General appearance: alert, well developed, well nourished, cooperative and in no distress Head: Normocephalic, without obvious abnormality, atraumatic Respiratory: Respirations even and unlabored, normal respiratory rate Heart: rate and rhythm normal. No gallop or murmurs noted on exam  Abdomen: BS +, no distention, no rebound tenderness, or no mass Extremities: No gross deformities Skin: Skin color, texture, turgor normal. No rashes seen  Psych: Appropriate mood and affect. Neurologic: Mental status: Alert, oriented to person, place, and time, thought content appropriate. Lab Results  Component Value Date   POCGLU 111 (A) 05/22/2015   POCGLU 105 (A) 02/13/2015   POCGLU 112 (A) 11/14/2014    Lab Results  Component Value Date   HGBA1C 5.7 (H) 08/10/2018      Assessment & Plan:  1. Primary osteoarthritis of both knees - [id number] 11+Oxyco+Alc+Crt-Bund, pending -Filled #60 quality acetaminophen-codeine 300-30 every 4 hours as needed for pain -Patient advised to use homeopathic treatment such as heat and topical rubs for pain initially to avoid overuse of  pain medication.  2. Controlled type 2 diabetes mellitus without complication, without long-term current use of insulin ([place]), has remained well controlled with diet and activity.  We will continue to monitor A1c and renal function. - Comprehensive metabolic panel - Hemoglobin A1c  3. Chronic low back pain with right-sided sciatica, unspecified back pain laterality -Same as #1 - [id number] 11+Oxyco+Alc+Crt-Bund  4. Essential hypertension, elevated today Traditionally very well controlled. Home readings are reassuring the current regimen is effective in management of blood pressure.  Patient will continue to check and log blood pressure readings at home.  Checking a CMP to evaluate renal function and electrolyte status.  No changes in medication today. - Comprehensive metabolic panel  5. Hypercholesteremia - Lipid panel -Continue statin therapy, increase intake of vegetables and fruits, and select lean meat. -Increase physical activity as tolerated  Medication contract obtained and signed and scanned to patient's medical record. PMP Aware Reviewed.  Return for follow-up in 3 months. -The patient was given clear instructions to go to ER or return to medical center if symptoms do not improve, worsen or new problems develop. The patient verbalized understanding.    Molli Barrows, FNP Primary Care at [facility] [address], [place] [phone]: [phone]

## 2018-12-24 LAB — DRUG SCREEN 764883 11+OXYCO+ALC+CRT-BUND
Amphetamines, Urine: NEGATIVE ng/mL
BENZODIAZ UR QL: NEGATIVE ng/mL
Barbiturate: NEGATIVE ng/mL
Cannabinoid Quant, Ur: NEGATIVE ng/mL
Cocaine (Metabolite): NEGATIVE ng/mL
Creatinine: 44.4 mg/dL (ref 20.0–300.0)
Ethanol: NEGATIVE %
Meperidine: NEGATIVE ng/mL
Methadone Screen, Urine: NEGATIVE ng/mL
OPIATE SCREEN URINE: NEGATIVE ng/mL
Oxycodone/Oxymorphone, Urine: NEGATIVE ng/mL
Phencyclidine: NEGATIVE ng/mL
Propoxyphene: NEGATIVE ng/mL
Tramadol: NEGATIVE ng/mL
pH, Urine: 5 (ref 4.5–8.9)

## 2018-12-24 LAB — LIPID PANEL
Chol/HDL Ratio: 5.5 ratio — ABNORMAL HIGH (ref 0.0–4.4)
Cholesterol, Total: 209 mg/dL — ABNORMAL HIGH (ref 100–199)
HDL: 38 mg/dL — ABNORMAL LOW (ref >39)
LDL Calculated: 141 mg/dL — ABNORMAL HIGH (ref 0–99)
Triglycerides: 149 mg/dL (ref 0–149)
VLDL Cholesterol Cal: 30 mg/dL (ref 5–40)

## 2018-12-24 LAB — COMPREHENSIVE METABOLIC PANEL
ALT: 17 IU/L (ref 0–32)
AST: 21 IU/L (ref 0–40)
Albumin/Globulin Ratio: 1.8 (ref 1.2–2.2)
Albumin: 4.6 g/dL (ref 3.6–4.6)
Alkaline Phosphatase: 70 IU/L (ref 39–117)
BUN/Creatinine Ratio: 9 — ABNORMAL LOW (ref 12–28)
BUN: 7 mg/dL — ABNORMAL LOW (ref 8–27)
Bilirubin Total: <0.2 mg/dL (ref 0.0–1.2)
CO2: 18 mmol/L — ABNORMAL LOW (ref 20–29)
Calcium: 9.5 mg/dL (ref 8.7–10.3)
Chloride: 102 mmol/L (ref 96–106)
Creatinine, Ser: 0.78 mg/dL (ref 0.57–1.00)
GFR calc Af Amer: 80 mL/min/{1.73_m2} (ref >59)
GFR calc non Af Amer: 69 mL/min/{1.73_m2} (ref >59)
Globulin, Total: 2.6 g/dL (ref 1.5–4.5)
Glucose: 190 mg/dL — ABNORMAL HIGH (ref 65–99)
Potassium: 4.2 mmol/L (ref 3.5–5.2)
Sodium: 140 mmol/L (ref 134–144)
Total Protein: 7.2 g/dL (ref 6.0–8.5)

## 2018-12-24 LAB — HEMOGLOBIN A1C
Est. average glucose Bld gHb Est-mCnc: 117 mg/dL
Hgb A1c MFr Bld: 5.7 % — ABNORMAL HIGH (ref 4.8–5.6)

## 2019-03-09 ENCOUNTER — Other Ambulatory Visit: Payer: Self-pay | Admitting: Family Medicine

## 2019-03-09 DIAGNOSIS — E785 Hyperlipidemia, unspecified: Secondary | ICD-10-CM

## 2019-03-09 DIAGNOSIS — I10 Essential (primary) hypertension: Secondary | ICD-10-CM

## 2019-03-09 NOTE — Telephone Encounter (Signed)
Requested medication (s) are due for refill today: yes  Requested medication (s) are on the active medication list: yes  Last refill:  12/12/2018  Future visit scheduled: yes  Notes to clinic: review for refill    Requested Prescriptions  Pending Prescriptions Disp Refills   metoprolol succinate (TOPROL-XL) 25 MG 24 hr tablet [Pharmacy Med Name: METOPROLOL SUCC ER 25 MG TAB] 90 tablet 3    Sig: TAKE 1 TABLET (25 MG TOTAL) BY MOUTH DAILY     Cardiovascular:  Beta Blockers Failed - 03/09/2019  1:45 AM      Failed - Last BP in normal range    BP Readings from Last 1 Encounters:  12/23/18 (!) 145/75         Failed - Valid encounter within last 6 months    Recent Outpatient Visits          12 months ago Controlled type 2 diabetes mellitus without complication, without long-term current use of insulin ([place])   Primary Care at [facility], Zoe A, MD   1 year ago Controlled type 2 diabetes mellitus without complication, without long-term current use of insulin ([place])   Primary Care at [facility], [place], [place]   1 year ago Lichenoid actinic keratosis   Primary Care at [facility], [place], [place]   1 year ago Controlled type 2 diabetes mellitus without complication, without long-term current use of insulin ([place])   Primary Care at [facility], [place], [place]   1 year ago Need for prophylactic vaccination and inoculation against influenza   Primary Care at [facility], [place], [place]             Passed - Last Heart Rate in normal range    Pulse Readings from Last 1 Encounters:  12/23/18 82

## 2019-03-11 ENCOUNTER — Encounter: Payer: Self-pay | Admitting: Family Medicine

## 2019-03-11 DIAGNOSIS — I10 Essential (primary) hypertension: Secondary | ICD-10-CM

## 2019-03-11 DIAGNOSIS — E785 Hyperlipidemia, unspecified: Secondary | ICD-10-CM

## 2019-03-14 MED ORDER — LISINOPRIL-HYDROCHLOROTHIAZIDE 20-12.5 MG PO TABS: 2.0000 | ORAL_TABLET | Freq: Every day | ORAL | 0 refills | Status: DC

## 2019-03-14 MED ORDER — AMLODIPINE BESYLATE 10 MG PO TABS: 10.0000 mg | ORAL_TABLET | Freq: Every day | ORAL | 0 refills | Status: DC

## 2019-03-14 MED ORDER — PRAVASTATIN SODIUM 20 MG PO TABS: 20.0000 mg | ORAL_TABLET | Freq: Every evening | ORAL | 0 refills | Status: DC

## 2019-03-28 ENCOUNTER — Telehealth: Payer: Self-pay

## 2019-03-28 NOTE — Telephone Encounter (Signed)

## 2019-03-29 ENCOUNTER — Ambulatory Visit (INDEPENDENT_AMBULATORY_CARE_PROVIDER_SITE_OTHER): Payer: Medicare Other | Admitting: Internal Medicine

## 2019-03-29 ENCOUNTER — Other Ambulatory Visit: Payer: Self-pay

## 2019-03-29 VITALS — BP 149/62 | HR 70 | Temp 97.3°F | Resp 17 | Ht 63.0 in | Wt 179.2 lb

## 2019-03-29 DIAGNOSIS — I10 Essential (primary) hypertension: Secondary | ICD-10-CM | POA: Diagnosis not present

## 2019-03-29 DIAGNOSIS — H903 Sensorineural hearing loss, bilateral: Secondary | ICD-10-CM

## 2019-03-29 DIAGNOSIS — Z23 Encounter for immunization: Secondary | ICD-10-CM

## 2019-03-29 DIAGNOSIS — E782 Mixed hyperlipidemia: Secondary | ICD-10-CM | POA: Diagnosis not present

## 2019-03-29 DIAGNOSIS — E119 Type 2 diabetes mellitus without complications: Secondary | ICD-10-CM

## 2019-03-29 DIAGNOSIS — M170 Bilateral primary osteoarthritis of knee: Secondary | ICD-10-CM

## 2019-03-29 DIAGNOSIS — Z862 Personal history of diseases of the blood and blood-forming organs and certain disorders involving the immune mechanism: Secondary | ICD-10-CM

## 2019-03-29 MED ORDER — ATORVASTATIN CALCIUM 20 MG PO TABS: 20.0000 mg | ORAL_TABLET | Freq: Every day | ORAL | 6 refills | Status: DC

## 2019-03-29 NOTE — Patient Instructions (Signed)
Your last LDL cholesterol was 141 with goal being less than 70.  I recommend changing the pravastatin to a different cholesterol medication called atorvastatin which should work better in lowering your cholesterol.  Continue your current blood pressure medications.

## 2019-03-29 NOTE — Progress Notes (Signed)
Would like flu shot today.  Ok on refills.  Checks BP at home occasionally. Readings have been in the 120-130/70s. Denies chest pain, SHOB, headaches, palpitations

## 2019-03-29 NOTE — Progress Notes (Signed)
Patient ID: TANGALA TIERRABLANCA, female    DOB: 07/19/1929  MRN: [id number]  CC: Diabetes, Hypertension, and Hyperlipidemia   Subjective: Jessica [name] is a 85 y.o. female who presents for chronic disease management.  Patient being seen at [facility] Her concerns today include:  HTN, OA knees, DM type II, HL,  Patient was last seen by Molli Barrows 11/2018.  HYPERTENSION Currently taking: see medication list Med Adherence: [x]  Yes    []  No Medication side effects: []  Yes    [x]  No Adherence with salt restriction: [x]  Yes    []  No Home Monitoring?: [x]  Yes    []  No Monitoring Frequency: 2-3 times a month Home BP results range: 129/88, 132/75, 122/79, 118/74, 133/78 SOB? []  Yes    [x]  No Chest Pain?: []  Yes    [x]  No Leg swelling?: []  Yes    [x]  No Headaches?: []  Yes    [x]  No Dizziness? []  Yes    [x]  No Comments:   DM: Diabetes is diet controlled.  Last A1c done in June was 5.7.  She does well with her eating habits.  She incorporates fresh vegetables into her diet.  She has a vegetable garden.  She avoid sugary foods and snacks.  Not getting in much activity as her right hip still bothers her even though she has had total hip replacement.  Knees also bother her at times.  Denies any blurred vision.  Last eye exam was December of last year.  Denies any numbness or tingling in the feet.  She has some difficulty in hearing me.  Gives history of being hearing impaired.  She does wear hearing aids and has both of them on today.  She is requesting to have her blood count checked.  Gives history of anemia secondary to hiatal hernia with gastric ulcer.  GI is Dr. Carlean Purl.  She takes iron 325 mg daily over-the-counter.  OA knees: She is on Tylenol with codeine which she takes sparingly.  It does cause some constipation for her so she takes Colace with it.  If the constipation gets severe she uses milk of magnesia but this is rare. She also uses an over-the-counter pain rub called Blu Emue  which she states is very helpful.   Patient Active Problem List   Diagnosis Date Noted  . History of iron deficiency anemia 02/16/2016  . 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 Hiatal hernia - 1o cm 11/20/2015  . BMI 30.0-30.9,adult 11/14/2014  . Lymphocytic colitis   . Overactive bladder   . Impaired hearing   . Glaucoma   . Cataracts, bilateral   . Constipation   . Back pain   . GERD (gastroesophageal reflux disease)   . Cystocele   . Low bone mass   . Diabetes type 2, controlled ([place])   . Osteoarthritis   . COLONIC POLYPS, ADENOMATOUS 09/09/2007  . Hyperlipidemia 09/09/2007  . HTN (hypertension) 09/09/2007     Current Outpatient Medications on File Prior to Visit  Medication Sig Dispense Refill  . amLODipine (NORVASC) 10 MG tablet Take 1 tablet (10 mg total) by mouth daily. 90 tablet 0  . aspirin 81 MG tablet Take 81 mg by mouth daily.    . Biotin 1 MG CAPS Take by mouth.    . cycloSPORINE (RESTASIS) 0.05 % ophthalmic emulsion Place 1 drop into both eyes 2 (two) times daily.    Marland Kitchen esomeprazole (NEXIUM) 20 MG capsule Take 1 capsule (20 mg total) by mouth daily at  12 noon. 90 capsule 3  . ferrous sulfate 325 (65 FE) MG tablet TAKE 1 TABLET (325 MG TOTAL) BY MOUTH 2 (TWO) TIMES DAILY WITH A MEAL. 180 tablet 3  . GLUCOSAMINE PO Take by mouth.    Marland Kitchen lisinopril-hydrochlorothiazide (ZESTORETIC) 20-12.5 MG tablet Take 2 tablets by mouth daily. 180 tablet 0  . metoprolol succinate (TOPROL-XL) 25 MG 24 hr tablet TAKE 1 TABLET (25 MG TOTAL) BY MOUTH DAILY 90 tablet 0  . Multiple Vitamins-Minerals (MULTIVITAMIN WITH MINERALS) tablet Take 1 tablet by mouth daily.     No current facility-administered medications on file prior to visit.     No Known Allergies  Social History   Socioeconomic History  . Marital status: Widowed    Spouse name: n/a  . Number of children: 2  . Years of education: 11th grade  . Highest education level: Not on file  Occupational History  . Occupation:  Retired Glass blower/designer  Social Needs  . Financial resource strain: Not on file  . Food insecurity    Worry: Not on file    Inability: Not on file  . Transportation needs    Medical: Not on file    Non-medical: Not on file  Tobacco Use  . Smoking status: Former Smoker    Types: Cigarettes    Quit date: 07/07/1995    Years since quitting: 23.7  . Smokeless tobacco: Never Used  Substance and Sexual Activity  . Alcohol use: No  . Drug use: No  . Sexual activity: Never  Lifestyle  . Physical activity    Days per week: Not on file    Minutes per session: Not on file  . Stress: Not on file  Relationships  . Social Herbalist on phone: Not on file    Gets together: Not on file    Attends religious service: Not on file    Active member of club or organization: Not on file    Attends meetings of clubs or organizations: Not on file    Relationship status: Not on file  . Intimate partner violence    Fear of current or ex partner: Not on file    Emotionally abused: Not on file    Physically abused: Not on file    Forced sexual activity: Not on file  Other Topics Concern  . Not on file  Social History Narrative   Widowed - retired Hospital doctor   Daughter (a retired Forensic psychologist) lives with patient - she has medical problems related to obesity.   Her son and his family live across the street.   2 caffeine drinks/day   10/31/2015    Family History  Problem Relation Age of Onset  . Stroke Mother 26  . Hypertension Mother   . Heart disease Father 64       Endocarditis  . Rheumatic fever Father   . Obesity Daughter        unable to walk very far  . Arthritis Daughter   . Anesthesia problems Neg Hx   . Hypotension Neg Hx   . Pseudochol deficiency Neg Hx   . Malignant hyperthermia Neg Hx   . Colon cancer Neg Hx   . Esophageal cancer Neg Hx   . Pancreatic cancer Neg Hx   . Rectal cancer Neg Hx   . Stomach cancer Neg Hx     Past Surgical History:   Procedure Laterality Date  . BASAL CELL CARCINOMA EXCISION Left 12/14/2014  LEFT temple  . cataract surgery 04/25/13; 05/16/2013    . COLONOSCOPY    . TOE SURGERY     bilateral  . TONSILLECTOMY AND ADENOIDECTOMY    . TOTAL HIP ARTHROPLASTY  09/24/2011   Procedure: TOTAL HIP ARTHROPLASTY;  Surgeon: Ninetta Lights, MD;  Location: [facility];  Service: Orthopedics;  Laterality: Right;    ROS: Review of Systems Negative except as stated above  PHYSICAL EXAM: BP (!) 149/62   Pulse 70   Temp (!) 97.3 F (36.3 C) (Temporal)   Resp 17   Ht 5\' 3"  (1.6 m)   Wt 179 lb 3.2 oz (81.3 kg)   SpO2 96%   BMI 31.74 kg/m   Physical Exam  General appearance - alert, well appearing, pleasant elderly female and in no distress Mental status - normal mood, behavior, speech, dress, motor activity, and thought processes Mouth - mucous membranes moist, pharynx normal without lesions Neck - supple, no significant adenopathy Chest - clear to auscultation, no wheezes, rales or rhonchi, symmetric air entry Heart - normal rate, regular rhythm, normal S1, S2, no murmurs, rubs, clicks or gallops Extremities -no lower extremity edema Diabetic Foot Exam - Simple   Simple Foot Form Visual Inspection No deformities, no ulcerations, no other skin breakdown bilaterally: Yes Sensation Testing Intact to touch and monofilament testing bilaterally: Yes Pulse Check Posterior Tibialis and Dorsalis pulse intact bilaterally: Yes Comments      CMP Latest Ref Rng & Units 12/23/2018 08/10/2018 03/10/2018  Glucose 65 - 99 mg/dL 190(H) 104(H) 98  BUN 8 - 27 mg/dL 7(L) 24 28(H)  Creatinine 0.57 - 1.00 mg/dL 0.78 1.04(H) 0.96  Sodium 134 - 144 mmol/L 140 141 138  Potassium 3.5 - 5.2 mmol/L 4.2 5.2 4.9  Chloride 96 - 106 mmol/L 102 101 101  CO2 20 - 29 mmol/L 18(L) 20 19(L)  Calcium 8.7 - 10.3 mg/dL 9.5 10.4(H) 10.2  Total Protein 6.0 - 8.5 g/dL 7.2 8.0 8.0  Total Bilirubin 0.0 - 1.2 mg/dL <0.2 0.2 0.3  Alkaline Phos  39 - 117 IU/L 70 83 103  AST 0 - 40 IU/L 21 21 21   ALT 0 - 32 IU/L 17 14 15    Lipid Panel     Component Value Date/Time   CHOL 209 (H) 12/23/2018 0924   TRIG 149 12/23/2018 0924   HDL 38 (L) 12/23/2018 0924   CHOLHDL 5.5 (H) 12/23/2018 0924   CHOLHDL 3.3 02/26/2016 1043   VLDL 31 (H) 02/26/2016 1043   LDLCALC 141 (H) 12/23/2018 0924    CBC    Component Value Date/Time   WBC 9.9 08/10/2018 1108   WBC 9.4 02/26/2016 1043   RBC 4.09 08/10/2018 1108   RBC 4.38 02/26/2016 1043   HGB 12.4 08/10/2018 1108   HCT 37.5 08/10/2018 1108   PLT 306 08/10/2018 1108   MCV 92 08/10/2018 1108   MCH 30.3 08/10/2018 1108   MCH 29.5 02/26/2016 1043   MCHC 33.1 08/10/2018 1108   MCHC 33.2 02/26/2016 1043   RDW 13.9 08/10/2018 1108   LYMPHSABS 2.8 08/10/2018 1108   MONOABS 752 02/26/2016 1043   EOSABS 0.1 08/10/2018 1108   BASOSABS 0.1 08/10/2018 1108    ASSESSMENT AND PLAN:  1. Controlled type 2 diabetes mellitus without complication, without long-term current use of insulin ([facility]) Diet controlled.  Encouraged her to continue healthy eating habits.  Move more as tolerated.  Will be due for eye exam in December  2. Essential hypertension Home blood pressure  readings are at goal of 130/80 or lower.  She has few outliers.  Continue current medications and low-salt diet  3. Mixed hyperlipidemia Not at goal.  Change Pravachol to atorvastatin.  Advised patient that statin medications can cause muscle aches and cramps at times.  If you experience this she should let me know - atorvastatin (LIPITOR) 20 MG tablet; Take 1 tablet (20 mg total) by mouth daily.  Dispense: 30 tablet; Refill: 6  4. Sensorineural hearing loss (SNHL) of both ears Wears hearing aids  5. History of iron deficiency anemia - CBC - Iron, TIBC and Ferritin Panel  6. Primary osteoarthritis of both knees Continue Tylenol with codeine as needed.  Advised her to take over-the-counter Colace for several days after she takes  Tylenol with codeine to help prevent constipation.  Also encourage eating green leafy vegetable at least daily  7. Need for influenza vaccination - Flu Vaccine QUAD 6+ mos PF IM (Fluarix Quad PF)     Patient was given the opportunity to ask questions.  Patient verbalized understanding of the plan and was able to repeat key elements of the plan.   Orders Placed This Encounter  Procedures  . Flu Vaccine QUAD 6+ mos PF IM (Fluarix Quad PF)  . CBC  . Iron, TIBC and Ferritin Panel     Requested Prescriptions   Signed Prescriptions Disp Refills  . atorvastatin (LIPITOR) 20 MG tablet 30 tablet 6    Sig: Take 1 tablet (20 mg total) by mouth daily.    Return in about 3 months (around 06/28/2019).  Karle Plumber, MD, FACP

## 2019-03-30 LAB — IRON,TIBC AND FERRITIN PANEL
Ferritin: 74 ng/mL (ref 15–150)
Iron Saturation: 33 % (ref 15–55)
Iron: 95 ug/dL (ref 27–139)
Total Iron Binding Capacity: 289 ug/dL (ref 250–450)
UIBC: 194 ug/dL (ref 118–369)

## 2019-03-30 LAB — CBC
Hematocrit: 35.4 % (ref 34.0–46.6)
Hemoglobin: 11.6 g/dL (ref 11.1–15.9)
MCH: 30 pg (ref 26.6–33.0)
MCHC: 32.8 g/dL (ref 31.5–35.7)
MCV: 92 fL (ref 79–97)
Platelets: 251 10*3/uL (ref 150–450)
RBC: 3.87 x10E6/uL (ref 3.77–5.28)
RDW: 13.6 % (ref 11.7–15.4)
WBC: 7.9 10*3/uL (ref 3.4–10.8)

## 2019-05-22 ENCOUNTER — Encounter: Payer: Self-pay | Admitting: Internal Medicine

## 2019-06-17 ENCOUNTER — Other Ambulatory Visit: Payer: Self-pay | Admitting: Internal Medicine

## 2019-06-21 ENCOUNTER — Other Ambulatory Visit: Payer: Self-pay | Admitting: Family Medicine

## 2019-06-21 DIAGNOSIS — I10 Essential (primary) hypertension: Secondary | ICD-10-CM

## 2019-06-21 MED ORDER — LISINOPRIL-HYDROCHLOROTHIAZIDE 20-12.5 MG PO TABS: 2.0000 | ORAL_TABLET | Freq: Every day | ORAL | 0 refills | Status: DC

## 2019-06-21 MED ORDER — AMLODIPINE BESYLATE 10 MG PO TABS: 10.0000 mg | ORAL_TABLET | Freq: Every day | ORAL | 0 refills | Status: DC

## 2019-07-05 ENCOUNTER — Ambulatory Visit (INDEPENDENT_AMBULATORY_CARE_PROVIDER_SITE_OTHER): Payer: Medicare Other | Admitting: Internal Medicine

## 2019-07-05 ENCOUNTER — Encounter: Payer: Self-pay | Admitting: Internal Medicine

## 2019-07-05 DIAGNOSIS — M170 Bilateral primary osteoarthritis of knee: Secondary | ICD-10-CM

## 2019-07-05 DIAGNOSIS — E119 Type 2 diabetes mellitus without complications: Secondary | ICD-10-CM

## 2019-07-05 DIAGNOSIS — R600 Localized edema: Secondary | ICD-10-CM

## 2019-07-05 DIAGNOSIS — I10 Essential (primary) hypertension: Secondary | ICD-10-CM

## 2019-07-05 MED ORDER — DICLOFENAC SODIUM 1 % EX GEL: 2.0000 g | Freq: Three times a day (TID) | CUTANEOUS | 3 refills | Status: DC | PRN

## 2019-07-05 NOTE — Progress Notes (Signed)
Virtual Visit via Telephone Note Due to current restrictions/limitations of in-office visits due to the COVID-19 pandemic, this scheduled clinical appointment was converted to a telehealth visit  I connected with Jessica [name] on 07/05/19 at 8:45 a.m by telephone and verified that I am speaking with the correct person using two identifiers.   I discussed the limitations, risks, security and privacy concerns of performing an evaluation and management service by telephone and the availability of in person appointments. I also discussed with the patient that there may be a patient responsible charge related to this service. The patient expressed understanding and agreed to proceed.   History of Present Illness: HTN, OA knees, DM type II, HL, hearing impaired (wears hearing aids), IDA.  Last seen 03/2019.  Purpose today is chronic ds management.  Pt c/o ankle and feet swelling that started.  Change Pravachol to Lipitor on last visit.  Pt had called and I gave okay for her to restart Pravastatin instead.  She reports that the swelling decreased some having changed back to pravastatin.  When she first wakes up in the mornings she has no swelling but the swelling occurs progressively during the day.  She admits that she did not eat as healthy over the holidays and has just started decreasing salt in her foods.  Denies any shortness of breath, no PND orthopnea.  She is on amlodipine which she has been on for a while now.  OA.  She is wanting to know whether I can prescribe hydrocodone for osteoarthritis pain.  She states that she has about 5 to 6 pills left from a prescription that was 1 year ago by previous PCP [facility].  She feels it works better for her than the Tylenol 3.  Reports increased pain in knees and shoulders because she overdid it over the holidays.  HTN: She is compliant with taking her medicines that include amlodipine, Toprol, lisinopril/HCTZ.  Denies any chest pains, headaches or  dizziness.  She checks blood pressure regularly.  Blood pressure this morning was 131/75.  Blood pressure yesterday morning was 126/71.  DM: She is not on medicines for diabetes.  She has been diet controlled up to this point.  No device to check blood sugars.  Last A1c was 11/2018. Outpatient Encounter Medications as of 07/05/2019  Medication Sig  . amLODipine (NORVASC) 10 MG tablet Take 1 tablet (10 mg total) by mouth daily.  Marland Kitchen aspirin 81 MG tablet Take 81 mg by mouth daily.  . Biotin 1 MG CAPS Take by mouth.  . cycloSPORINE (RESTASIS) 0.05 % ophthalmic emulsion Place 1 drop into both eyes 2 (two) times daily.  Marland Kitchen esomeprazole (NEXIUM) 20 MG capsule Take 1 capsule (20 mg total) by mouth daily at 12 noon.  . ferrous sulfate 325 (65 FE) MG tablet TAKE 1 TABLET (325 MG TOTAL) BY MOUTH 2 (TWO) TIMES DAILY WITH A MEAL.  Marland Kitchen GLUCOSAMINE PO Take by mouth.  Marland Kitchen lisinopril-hydrochlorothiazide (ZESTORETIC) 20-12.5 MG tablet Take 2 tablets by mouth daily.  . metoprolol succinate (TOPROL-XL) 25 MG 24 hr tablet TAKE 1 TABLET BY MOUTH EVERY DAY  . Multiple Vitamins-Minerals (MULTIVITAMIN WITH MINERALS) tablet Take 1 tablet by mouth daily.  . pravastatin (PRAVACHOL) 20 MG tablet Take 20 mg by mouth every evening.   No facility-administered encounter medications on file as of 07/05/2019.      Observations/Objective: No direct observation done as this was a telephone encounter. Results for orders placed or performed in visit on 03/29/19  CBC  Result Value Ref Range   WBC 7.9 3.4 - 10.8 x10E3/uL   RBC 3.87 3.77 - 5.28 x10E6/uL   Hemoglobin 11.6 11.1 - 15.9 g/dL   Hematocrit 35.4 34.0 - 46.6 %   MCV 92 79 - 97 fL   MCH 30.0 26.6 - 33.0 pg   MCHC 32.8 31.5 - 35.7 g/dL   RDW 13.6 11.7 - 15.4 %   Platelets 251 150 - 450 x10E3/uL  Iron, TIBC and Ferritin Panel  Result Value Ref Range   Total Iron Binding Capacity 289 250 - 450 ug/dL   UIBC 194 118 - 369 ug/dL   Iron 95 27 - 139 ug/dL   Iron Saturation 33 15  - 55 %   Ferritin 74 15 - 150 ng/mL     Assessment and Plan: 1. Lower extremity edema Differential diagnosis for ankle and feet edema include dietary indiscretion from excess salt use versus due to medication like amlodipine versus CHF. Advised that she cut back on salt in the foods as much as possible.  If swelling continues the next option would be to change the amlodipine.  She will come to the lab tomorrow for [place] to check BNP - Brain natriuretic peptide; Future  2. Primary osteoarthritis of both knees Advised patient that we do not prescribe anything stronger than Tylenol threes in this practice.  I recommend trial of topical anti-inflammatory gel like Voltaren to use along with Tylenol with codeine.  She is agreeable to giving this a try. - diclofenac Sodium (VOLTAREN) 1 % GEL; Apply 2 g topically every 8 (eight) hours as needed.  Dispense: 100 g; Refill: 3  3. Essential hypertension Reported blood pressure readings are good.  Continue current medications.  Low-salt diet advised - Comprehensive metabolic panel; Future  4. Controlled type 2 diabetes mellitus without complication, without long-term current use of insulin ([place]) Encouraged her to get back to healthy eating habits now that holidays have ended. - Hemoglobin A1c; Future   Follow Up Instructions: 3 mths    I discussed the assessment and treatment plan with the patient. The patient was provided an opportunity to ask questions and all were answered. The patient agreed with the plan and demonstrated an understanding of the instructions.   The patient was advised to call back or seek an in-person evaluation if the symptoms worsen or if the condition fails to improve as anticipated.  I provided 13 minutes of non-face-to-face time during this encounter.   Karle Plumber, MD

## 2019-07-06 ENCOUNTER — Other Ambulatory Visit: Payer: Self-pay

## 2019-07-06 ENCOUNTER — Other Ambulatory Visit: Payer: Medicare Other

## 2019-07-06 DIAGNOSIS — I10 Essential (primary) hypertension: Secondary | ICD-10-CM

## 2019-07-06 DIAGNOSIS — E119 Type 2 diabetes mellitus without complications: Secondary | ICD-10-CM

## 2019-07-06 DIAGNOSIS — R600 Localized edema: Secondary | ICD-10-CM

## 2019-07-06 NOTE — Progress Notes (Signed)
Patient here for non-fasting labs.

## 2019-07-07 LAB — COMPREHENSIVE METABOLIC PANEL
ALT: 16 IU/L (ref 0–32)
AST: 25 IU/L (ref 0–40)
Albumin/Globulin Ratio: 1.6 (ref 1.2–2.2)
Albumin: 4.9 g/dL — ABNORMAL HIGH (ref 3.6–4.6)
Alkaline Phosphatase: 82 IU/L (ref 39–117)
BUN/Creatinine Ratio: 27 (ref 12–28)
BUN: 30 mg/dL — ABNORMAL HIGH (ref 8–27)
Bilirubin Total: 0.2 mg/dL (ref 0.0–1.2)
CO2: 22 mmol/L (ref 20–29)
Calcium: 10.3 mg/dL (ref 8.7–10.3)
Chloride: 104 mmol/L (ref 96–106)
Creatinine, Ser: 1.12 mg/dL — ABNORMAL HIGH (ref 0.57–1.00)
GFR calc Af Amer: 51 mL/min/{1.73_m2} — ABNORMAL LOW (ref >59)
GFR calc non Af Amer: 44 mL/min/{1.73_m2} — ABNORMAL LOW (ref >59)
Globulin, Total: 3 g/dL (ref 1.5–4.5)
Glucose: 105 mg/dL — ABNORMAL HIGH (ref 65–99)
Potassium: 4.7 mmol/L (ref 3.5–5.2)
Sodium: 140 mmol/L (ref 134–144)
Total Protein: 7.9 g/dL (ref 6.0–8.5)

## 2019-07-07 LAB — HEMOGLOBIN A1C
Est. average glucose Bld gHb Est-mCnc: 120 mg/dL
Hgb A1c MFr Bld: 5.8 % — ABNORMAL HIGH (ref 4.8–5.6)

## 2019-07-07 LAB — BRAIN NATRIURETIC PEPTIDE: BNP: 66 pg/mL (ref 0.0–100.0)

## 2019-07-10 ENCOUNTER — Other Ambulatory Visit: Payer: Self-pay | Admitting: Internal Medicine

## 2019-07-10 DIAGNOSIS — N289 Disorder of kidney and ureter, unspecified: Secondary | ICD-10-CM

## 2019-07-11 NOTE — Progress Notes (Signed)
Seen by patient Jessica [name] on 07/10/2019  4:16 PM EST

## 2019-07-14 ENCOUNTER — Encounter: Payer: Self-pay | Admitting: Internal Medicine

## 2019-07-15 ENCOUNTER — Other Ambulatory Visit: Payer: Self-pay | Admitting: Family Medicine

## 2019-07-15 ENCOUNTER — Encounter: Payer: Self-pay | Admitting: Internal Medicine

## 2019-07-15 DIAGNOSIS — E785 Hyperlipidemia, unspecified: Secondary | ICD-10-CM

## 2019-07-16 ENCOUNTER — Encounter: Payer: Self-pay | Admitting: Internal Medicine

## 2019-09-24 ENCOUNTER — Other Ambulatory Visit: Payer: Self-pay | Admitting: Internal Medicine

## 2019-09-24 ENCOUNTER — Other Ambulatory Visit: Payer: Self-pay | Admitting: Family Medicine

## 2019-09-24 DIAGNOSIS — I10 Essential (primary) hypertension: Secondary | ICD-10-CM

## 2019-09-24 DIAGNOSIS — E785 Hyperlipidemia, unspecified: Secondary | ICD-10-CM

## 2019-09-25 ENCOUNTER — Encounter: Payer: Self-pay | Admitting: Family Medicine

## 2019-09-25 MED ORDER — METOPROLOL SUCCINATE ER 25 MG PO TB24: 25.0000 mg | ORAL_TABLET | Freq: Every day | ORAL | 0 refills | Status: DC

## 2019-09-26 MED ORDER — METOPROLOL SUCCINATE ER 25 MG PO TB24: 25.0000 mg | ORAL_TABLET | Freq: Every day | ORAL | 0 refills | Status: DC

## 2019-09-26 MED ORDER — PRAVASTATIN SODIUM 20 MG PO TABS: 20.0000 mg | ORAL_TABLET | Freq: Every evening | ORAL | 0 refills | Status: DC

## 2019-09-26 MED ORDER — AMLODIPINE BESYLATE 10 MG PO TABS: 10.0000 mg | ORAL_TABLET | Freq: Every day | ORAL | 0 refills | Status: DC

## 2019-09-26 MED ORDER — LISINOPRIL-HYDROCHLOROTHIAZIDE 20-12.5 MG PO TABS: 2.0000 | ORAL_TABLET | Freq: Every day | ORAL | 0 refills | Status: DC

## 2019-10-04 NOTE — Patient Instructions (Signed)
Thank you for choosing Primary Care at [facility] to be your medical home!    Jessica [name] was seen by Jessica Schools, DO today.   Jessica [name]'s primary care provider is Jessica Myron, DO.   For the best care possible, you should try to see Jessica Myron, DO whenever you come to the clinic.   We look forward to seeing you again soon!  If you have any questions about your visit today, please call us at [phone] or feel free to reach your primary care provider via [place].

## 2019-10-05 ENCOUNTER — Ambulatory Visit (INDEPENDENT_AMBULATORY_CARE_PROVIDER_SITE_OTHER): Payer: Medicare Other | Admitting: Internal Medicine

## 2019-10-05 ENCOUNTER — Other Ambulatory Visit: Payer: Self-pay

## 2019-10-05 ENCOUNTER — Encounter: Payer: Self-pay | Admitting: Internal Medicine

## 2019-10-05 VITALS — BP 146/69 | HR 81 | Temp 97.3°F | Resp 17 | Wt 166.0 lb

## 2019-10-05 DIAGNOSIS — R7303 Prediabetes: Secondary | ICD-10-CM | POA: Diagnosis not present

## 2019-10-05 DIAGNOSIS — E782 Mixed hyperlipidemia: Secondary | ICD-10-CM | POA: Diagnosis not present

## 2019-10-05 DIAGNOSIS — N183 Chronic kidney disease, stage 3 unspecified: Secondary | ICD-10-CM

## 2019-10-05 DIAGNOSIS — R600 Localized edema: Secondary | ICD-10-CM

## 2019-10-05 DIAGNOSIS — I10 Essential (primary) hypertension: Secondary | ICD-10-CM

## 2019-10-05 DIAGNOSIS — N1831 Chronic kidney disease, stage 3a: Secondary | ICD-10-CM | POA: Insufficient documentation

## 2019-10-05 LAB — POCT GLYCOSYLATED HEMOGLOBIN (HGB A1C): Hemoglobin A1C: 5.5 % (ref 4.0–5.6)

## 2019-10-05 LAB — GLUCOSE, POCT (MANUAL RESULT ENTRY): POC Glucose: 107 mg/dl — AB (ref 70–99)

## 2019-10-05 NOTE — Progress Notes (Signed)
Subjective:    QUISHA DUPLANTIS - 86 y.o. female MRN [id number]  Date of birth: [birth year]  HPI  LENIYA FREGOSO is here for follow up of chronic medical conditions.  Chronic HTN Disease Monitoring:  Home BP Monitoring - Yes. Has a log today from past two weeks and all systolic numbers in [id number] and diastolic in [id number].  Chest pain- no  Dyspnea- no Headache - no  Medications: Lisinopril 40 mg, HCTZ 25 mg, Metoprolol XL 25 mg daily, Amlodipine 10 mg  Compliance- yes Lightheadedness- no  Edema- yes    PreDiabetes: Last A1C: 5.8% (Jan 2021). Does not monitor CBGs. Never been on DM medications. She reports limiting carbs and salt in her diet. Sticks to a Mediterranean type diet.     Health Maintenance: Health Maintenance Due  Topic Date Due  . OPHTHALMOLOGY EXAM  05/31/2019    -  reports that she quit smoking about 24 years ago. Her smoking use included cigarettes. She has never used smokeless tobacco. - Review of Systems: Per HPI. - Past Medical History: Patient Active Problem List   Diagnosis Date Noted  . History of iron deficiency anemia 02/16/2016  . 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 Hiatal hernia - 1o cm 11/20/2015  . BMI 30.0-30.9,adult 11/14/2014  . Lymphocytic colitis   . Overactive bladder   . Impaired hearing   . Glaucoma   . Cataracts, bilateral   . Constipation   . Back pain   . GERD (gastroesophageal reflux disease)   . Cystocele   . Low bone mass   . Diabetes type 2, controlled ([place])   . Osteoarthritis   . COLONIC POLYPS, ADENOMATOUS 09/09/2007  . Hyperlipidemia 09/09/2007  . HTN (hypertension) 09/09/2007   - Medications: reviewed and updated   Objective:   Physical Exam BP (!) 146/69   Pulse 81   Temp (!) 97.3 F (36.3 C) (Temporal)   Resp 17   Wt 166 lb (75.3 kg)   SpO2 97%   BMI 29.41 kg/m  Physical Exam  Constitutional: She is oriented to person, place, and time and well-developed, well-nourished, and in no  distress. No distress.  Cardiovascular: Normal rate.  No LE edema present.   Pulmonary/Chest: Effort normal. No respiratory distress.  Musculoskeletal:        General: Normal range of motion.  Neurological: She is alert and oriented to person, place, and time.  Skin: Skin is warm and dry. She is not diaphoretic.  Psychiatric: Affect and judgment normal.           Assessment & Plan:   1. Prediabetes A1c well controlled at 5.5%. Discussed appropriate diet regimen.  - HgB A1c - Glucose (CBG) - Ambulatory referral to Ophthalmology  2. Essential hypertension BP slightly above goal today; however, very well controlled on home log. Will continue current medication regimen.  - Comprehensive metabolic panel  3. Mixed hyperlipidemia Continue Pravastatin.   4. Stage 3 chronic kidney disease, unspecified whether stage 3a or 3b CKD Cr 1.12 and GFR 44 in Jan 2021. A referral to nephrology was placed but patient has not been able to reach them to schedule an appointment. She requests repeat blood work today. Discussed need for adequate hydration and avoidance of NSAIDs.  - Comprehensive metabolic panel  5. Lower extremity edema Sounds more consistent with dependent edema given present at end of day and resolves with elevation. Diminished kidney function may be contributing. Patient is also on Amlodipine however has been on it for several  years and would expect more chronic edema. Could potentially trial dose reduction given home BPs well controlled. Will await nephrology recommendations.      Phill Myron, D.O. 10/05/2019, 8:43 AM Primary Care at [facility]

## 2019-10-06 ENCOUNTER — Other Ambulatory Visit: Payer: Self-pay | Admitting: Internal Medicine

## 2019-10-06 DIAGNOSIS — E875 Hyperkalemia: Secondary | ICD-10-CM

## 2019-10-06 LAB — COMPREHENSIVE METABOLIC PANEL
ALT: 12 IU/L (ref 0–32)
AST: 20 IU/L (ref 0–40)
Albumin/Globulin Ratio: 1.7 (ref 1.2–2.2)
Albumin: 4.8 g/dL — ABNORMAL HIGH (ref 3.6–4.6)
Alkaline Phosphatase: 91 IU/L (ref 39–117)
BUN/Creatinine Ratio: 25 (ref 12–28)
BUN: 29 mg/dL — ABNORMAL HIGH (ref 8–27)
Bilirubin Total: 0.2 mg/dL (ref 0.0–1.2)
CO2: 19 mmol/L — ABNORMAL LOW (ref 20–29)
Calcium: 10.2 mg/dL (ref 8.7–10.3)
Chloride: 107 mmol/L — ABNORMAL HIGH (ref 96–106)
Creatinine, Ser: 1.15 mg/dL — ABNORMAL HIGH (ref 0.57–1.00)
GFR calc Af Amer: 49 mL/min/{1.73_m2} — ABNORMAL LOW (ref >59)
GFR calc non Af Amer: 43 mL/min/{1.73_m2} — ABNORMAL LOW (ref >59)
Globulin, Total: 2.8 g/dL (ref 1.5–4.5)
Glucose: 100 mg/dL — ABNORMAL HIGH (ref 65–99)
Potassium: 5.7 mmol/L — ABNORMAL HIGH (ref 3.5–5.2)
Sodium: 141 mmol/L (ref 134–144)
Total Protein: 7.6 g/dL (ref 6.0–8.5)

## 2019-10-06 MED ORDER — HYDROCHLOROTHIAZIDE 25 MG PO TABS: 25.0000 mg | ORAL_TABLET | Freq: Every day | ORAL | 3 refills | Status: DC

## 2019-10-07 NOTE — Progress Notes (Signed)
Patient notified of results & recommendations. Expressed understanding. Made a lab appointment for recheck on 10/12/2019.

## 2019-10-12 ENCOUNTER — Other Ambulatory Visit: Payer: Medicare Other

## 2019-10-12 ENCOUNTER — Other Ambulatory Visit: Payer: Self-pay

## 2019-10-12 DIAGNOSIS — E875 Hyperkalemia: Secondary | ICD-10-CM

## 2019-10-12 NOTE — Progress Notes (Signed)
Patient here for repeat potassium.

## 2019-10-13 LAB — POTASSIUM: Potassium: 4.7 mmol/L (ref 3.5–5.2)

## 2019-10-13 NOTE — Progress Notes (Signed)
Patient notified of results via MyChart. 

## 2019-11-01 ENCOUNTER — Ambulatory Visit
Admission: EM | Admit: 2019-11-01 | Discharge: 2019-11-01 | Disposition: A | Discharge status: Home / Self Care | Payer: Medicare Other

## 2019-11-01 NOTE — ED Notes (Signed)
Spoke to patient.  Denies chest pain, denies feeling any irregular heart beats.  Patient reports monitor with irregular beats and provider told her to get an ekg and appt in clinic (next door).  Unable to have appt until 5/13, patient frustrated.  Patient's blood pressure medicine changed several weeks ago.

## 2019-11-03 ENCOUNTER — Other Ambulatory Visit: Payer: Self-pay

## 2019-11-03 ENCOUNTER — Ambulatory Visit (INDEPENDENT_AMBULATORY_CARE_PROVIDER_SITE_OTHER): Payer: Medicare Other | Admitting: Internal Medicine

## 2019-11-03 ENCOUNTER — Encounter: Payer: Self-pay | Admitting: Internal Medicine

## 2019-11-03 VITALS — BP 159/76 | HR 90 | Temp 97.7°F | Resp 17 | Ht 63.0 in | Wt 160.0 lb

## 2019-11-03 DIAGNOSIS — R002 Palpitations: Secondary | ICD-10-CM

## 2019-11-03 NOTE — Patient Instructions (Signed)
Premature Atrial Contraction  A premature atrial contraction [facility]) is a kind of irregular heartbeat (arrhythmia). It happens when the heart beats too early and then pauses before beating again. The heart has four areas, or chambers. Normally, electrical signals spread across the heart and make all the chambers beat together. During a PAC, the upper chambers of the heart (atria) beat too early, before they have had time to fill with blood. The heartbeat pauses afterward so the heart can fill with blood for the next beat. Sometimes PAC can be a warning sign of another type of arrhythmia called atrial fibrillation. Atrial fibrillation may allow blood to pool in the atria and form clots. If a clot travels to the brain, it can cause a stroke. What are the causes? The cause of this condition is often unknown. Sometimes, this condition may be caused by heart disease or injury to the heart. What increases the risk? You are more likely to develop this condition if:  You are a child.  You are an adult who is 74 years of age or older. Episodes may be triggered by:  Caffeine.  Alcohol.  Tobacco use.  Stimulant drugs.  Some medicines or supplements.  Stress.  Heart disease. What are the signs or symptoms? Symptoms of this condition include:  A feeling that your heart skipped a beat. The first heartbeat after the "skipped" beat may feel more forceful.  A feeling that your heart is fluttering. How is this diagnosed? This condition is diagnosed based on:  Your symptoms.  A physical exam. Your health care provider may listen to your heart.  An electrocardiogram (ECG). This is a test that records the electrical impulses of the heart.  An ambulatory cardiac monitor. This device records your heartbeats for 24 hours or more. You may also have:  An echocardiogram to check for any heart conditions. This is a type of imaging test that uses sound waves (ultrasound) to make images of your  heart.  Blood tests. How is this treated? Treatment depends on the frequency of your symptoms and other risk factors. Treatments may include:  Medicines (beta-blockers).  Catheter ablation. This is done to destroy the part of the heart tissue that sends abnormal signals. In some cases, treatment may not be needed for this condition. Follow these instructions at home: Lifestyle  Do not use any products that contain nicotine or tobacco, such as cigarettes, e-cigarettes, and chewing tobacco. If you need help quitting, ask your health care provider.  Exercise regularly. Ask your health care provider what type of exercise is safe for you.  Find healthy ways to manage stress.  Try to get at least 7-9 hours of sleep each night, or as much as recommended by your health care provider. Alcohol use  Do not drink alcohol if: ? Your health care provider tells you not to drink. ? You are pregnant, may be pregnant, or are planning to become pregnant. ? Alcohol triggers your episodes.  If you drink alcohol: ? Limit how much you use to:  0-1 drink a day for women.  0-2 drinks a day for men. ? Be aware of how much alcohol is in your drink. In the [place] one drink equals one 12 oz bottle of beer (355 mL), one 5 oz glass of wine (148 mL), or one 1 oz glass of hard liquor (44 mL). General instructions  Take over-the-counter and prescription medicines only as told by your health care provider.  If caffeine triggers episodes, do not eat,  drink, or use anything with caffeine in it.  Keep all follow-up visits as told by your health care provider. This is important. Contact a health care provider if:  You feel your heart skipping beats.  Your heart skips beats and you feel dizzy, light-headed, or very tired. Get help right away if you have:  Chest pain.  Trouble breathing.  Any symptoms of a stroke. "BE FAST" is an easy way to remember the main warning signs of a stroke. ? B - Balance.  Signs are dizziness, sudden trouble walking, or loss of balance. ? E - Eyes. Signs are trouble seeing or a sudden change in vision. ? F - Face. Signs are sudden weakness or numbness of the face, or the face or eyelid drooping on one side. ? A - Arms. Signs are weakness or numbness in an arm. This happens suddenly and usually on one side of the body. ? S - Speech. Signs are sudden trouble speaking, slurred speech, or trouble understanding what people say. ? T - Time. Time to call emergency services. Write down what time symptoms started.  Other signs of stroke, such as: ? A sudden, severe headache with no known cause. ? Nausea or vomiting. ? Seizure. These symptoms may represent a serious problem that is an emergency. Do not wait to see if the symptoms will go away. Get medical help right away. Call your local emergency services (911 in the [place] Do not drive yourself to the hospital. Summary  A premature atrial contraction (PAC) is a kind of irregular heartbeat (arrhythmia). It happens when the heart beats too early and then pauses before beating again.  Treatment depends on your symptoms and whether you have other underlying heart conditions.  Contact a health care provider if your heart skips beats and you feel dizzy, light-headed, or very tired.  In some cases, this condition may lead to a stroke. "BE FAST" is an easy way to remember the warning signs of stroke. Get help right away if you have any of the "BE FAST" signs. This information is not intended to replace advice given to you by your health care provider. Make sure you discuss any questions you have with your health care provider. Document Revised: 03/11/2018 Document Reviewed: 03/11/2018 Elsevier Patient Education  2020 Elsevier Inc.  

## 2019-11-03 NOTE — Progress Notes (Signed)
Subjective:    Jessica [name] - 86 y.o. female MRN [id number]  Date of birth: 1930/06/18  HPI  Jessica [name] is here for concerns about heart rhythm irregularity. After labs on 4/7 showed hyperkalemia, discontinued Lisinopril and continued HCTZ 25 mg. Her repeat K was normal on 4/14 with this change. Patient reports this has been occurring since this medication change occurred. Her blood pressure machine is giving her an arrhythmia symbol. She feels a sensation of heart skipping beats several times per day. She occasionally has to take a big deep breath. Denies headaches, vision changes, syncope, chest pain. Denies history of Afib or other irregular heart beat. Never had this sensation before.   Health Maintenance:  Health Maintenance Due  Topic Date Due  . COVID-19 Vaccine (1) Never done  . URINE MICROALBUMIN  11/14/2015  . OPHTHALMOLOGY EXAM  05/31/2019    -  reports that she quit smoking about 24 years ago. Her smoking use included cigarettes. She has never used smokeless tobacco. - Review of Systems: Per HPI. - Past Medical History: Patient Active Problem List   Diagnosis Date Noted  . Stage 3 chronic kidney disease 10/05/2019  . History of iron deficiency anemia 02/16/2016  . 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 Hiatal hernia - 1o cm 11/20/2015  . BMI 30.0-30.9,adult 11/14/2014  . Lymphocytic colitis   . Overactive bladder   . Impaired hearing   . Glaucoma   . Cataracts, bilateral   . Constipation   . Back pain   . GERD (gastroesophageal reflux disease)   . Cystocele   . Low bone mass   . Diabetes type 2, controlled ([place])   . Osteoarthritis   . COLONIC POLYPS, ADENOMATOUS 09/09/2007  . Hyperlipidemia 09/09/2007  . HTN (hypertension) 09/09/2007   - Medications: reviewed and updated   Objective:   Physical Exam BP (!) 159/76   Pulse 90   Temp 97.7 F (36.5 C) (Temporal)   Resp 17   Ht 5\' 3"  (1.6 m)   Wt 160 lb (72.6 kg)   SpO2 96%   BMI 28.34 kg/m   Physical Exam  Constitutional: She is oriented to person, place, and time and well-developed, well-nourished, and in no distress. No distress.  HENT:  Head: Normocephalic and atraumatic.  Eyes: Conjunctivae and EOM are normal.  Cardiovascular: Normal rate and regular rhythm.  No murmur heard. Occasional additional heart beat.   Pulmonary/Chest: Effort normal and breath sounds normal. No respiratory distress.  Musculoskeletal:        General: Normal range of motion.  Neurological: She is alert and oriented to person, place, and time.  Skin: Skin is warm and dry. She is not diaphoretic.  Psychiatric: Affect and judgment normal.           Assessment & Plan:   1. Heart palpitations Ausculation with occasional additional heart beat but no murmurs. EKG showed occasional PAC. Will check some other labs that could be related to sensation of heart palpitations. Discussed with patient that PACs are typically benign and discussed lifestyle modifications to help mitigate occurrence. She is already on beta blocker therapy. Could consider a dose increase if becomes more symptomatic because pulse trend appears that it could tolerate this. Will send to cardiology for consideration of Holter monitor as well as echo to evaluate for structural abnormalities not appreciated on exam as patient does not have prior echo on record. Discussed with patient that I do not believe this is related to discontinuation of the  Lisinopril itself but could be related to higher BP trend since change in medication therapy due to now resolved hyperkalemia.  - EKG 12-Lead - CBC - TSH - Basic Metabolic Panel - Ambulatory referral to Cardiology   Phill Myron, D.O. 11/03/2019, 2:02 PM Primary Care at [facility]

## 2019-11-04 LAB — CBC
Hematocrit: 38.9 % (ref 34.0–46.6)
Hemoglobin: 12.6 g/dL (ref 11.1–15.9)
MCH: 29.9 pg (ref 26.6–33.0)
MCHC: 32.4 g/dL (ref 31.5–35.7)
MCV: 92 fL (ref 79–97)
Platelets: 306 10*3/uL (ref 150–450)
RBC: 4.21 x10E6/uL (ref 3.77–5.28)
RDW: 13.1 % (ref 11.7–15.4)
WBC: 12 10*3/uL — ABNORMAL HIGH (ref 3.4–10.8)

## 2019-11-04 LAB — BASIC METABOLIC PANEL
BUN/Creatinine Ratio: 30 — ABNORMAL HIGH (ref 12–28)
BUN: 35 mg/dL — ABNORMAL HIGH (ref 8–27)
CO2: 19 mmol/L — ABNORMAL LOW (ref 20–29)
Calcium: 10.1 mg/dL (ref 8.7–10.3)
Chloride: 100 mmol/L (ref 96–106)
Creatinine, Ser: 1.15 mg/dL — ABNORMAL HIGH (ref 0.57–1.00)
GFR calc Af Amer: 49 mL/min/{1.73_m2} — ABNORMAL LOW (ref >59)
GFR calc non Af Amer: 43 mL/min/{1.73_m2} — ABNORMAL LOW (ref >59)
Glucose: 101 mg/dL — ABNORMAL HIGH (ref 65–99)
Potassium: 4.1 mmol/L (ref 3.5–5.2)
Sodium: 139 mmol/L (ref 134–144)

## 2019-11-04 LAB — TSH: TSH: 1.61 u[IU]/mL (ref 0.450–4.500)

## 2019-11-07 NOTE — Progress Notes (Signed)
Sent result notes via MyChart per patient request

## 2019-11-10 ENCOUNTER — Ambulatory Visit: Payer: Medicare Other | Admitting: Internal Medicine

## 2019-11-13 ENCOUNTER — Encounter: Payer: Self-pay | Admitting: Cardiology

## 2019-11-13 DIAGNOSIS — Z7189 Other specified counseling: Secondary | ICD-10-CM | POA: Insufficient documentation

## 2019-11-13 DIAGNOSIS — R002 Palpitations: Secondary | ICD-10-CM | POA: Insufficient documentation

## 2019-11-13 NOTE — Progress Notes (Signed)
Cardiology Office Note   Date:  11/14/2019   ID:  Jessica [name], DOB 20-Mar-1930, MRN [id number]  PCP:  Jessica Bang, DO  Cardiologist:   No primary care provider on file. Referring:  Jessica Bang, DO  No chief complaint on file.     History of Present Illness: Jessica [name] is a 86 y.o. female who is referred by Jessica Bang, DO for evaluation of palpitations.  She had a distant stress test.  I saw her 2013 for preop clearance.  No further testing was indicated.   She has been doing well.  Recently she had her lisinopril stopped because her potassium went up.  She was started on HCTZ.  To follow-up she will check her blood pressure and the monitor would say arrhythmia.  She went to her primary care physician and she did have premature atrial contractions noted on the EKG.  Her blood pressure has been well controlled on the current regimen of medicine.  Her creatinine is mildly elevated and she is going to see a nephrologist.  However, she has no cardiovascular complaints. The patient denies any new symptoms such as chest discomfort, neck or arm discomfort. There has been no new shortness of breath, PND or orthopnea. There have been no reported palpitations, presyncope or syncope.  The only thing she really reports is that occasionally she has to take a deep breath like sighing.  Past Medical History:  Diagnosis Date  . Allergy   . Back pain    herniated disc  . Basal cell carcinoma of face 11/2014   LEFT [place]  . Cataracts, bilateral    bilateral removed  . Colon polyps   . Cystocele   . Essential hypertension, benign    takes Amlodipine and Prinizide daily  . GERD (gastroesophageal reflux disease)    takes Nexium daily  . Glaucoma    pt denies  . Hyperlipidemia    takes Pravastatin daily  . Impaired hearing   . Incontinence    occasionally  . Iron deficiency anemia due to chronic blood loss 02/16/2016   Due to chronic cameron  ulcers. See GI notes. Continue PPI and iron supplementation.  . Low bone mass 02/2008  . Lymphocytic colitis   . Osteoarthritis    hip, knee; severe  . Osteoporosis   . Postoperative anemia due to acute blood loss 09/26/2011  . Type II or unspecified type diabetes mellitus without mention of complication, not stated as uncontrolled 08/2010   Diet controlled    Past Surgical History:  Procedure Laterality Date  . BASAL CELL CARCINOMA EXCISION Left 12/14/2014   LEFT temple  . cataract surgery 04/25/13; 05/16/2013    . COLONOSCOPY    . TOE SURGERY     bilateral  . TONSILLECTOMY AND ADENOIDECTOMY    . TOTAL HIP ARTHROPLASTY  09/24/2011   Procedure: TOTAL HIP ARTHROPLASTY;  Surgeon: Ninetta Lights, MD;  Location: [facility];  Service: Orthopedics;  Laterality: Right;     Current Outpatient Medications  Medication Sig Dispense Refill  . amLODipine (NORVASC) 10 MG tablet Take 1 tablet (10 mg total) by mouth daily. Dx: I10 90 tablet 0  . aspirin 81 MG tablet Take 81 mg by mouth daily.    . Biotin 1 MG CAPS Take by mouth.    . cycloSPORINE (RESTASIS) 0.05 % ophthalmic emulsion Place 1 drop into both eyes 2 (two) times daily.    . diclofenac Sodium (VOLTAREN) 1 % GEL Apply  2 g topically every 8 (eight) hours as needed. 100 g 3  . esomeprazole (NEXIUM) 20 MG capsule Take 1 capsule (20 mg total) by mouth daily at 12 noon. 90 capsule 3  . ferrous sulfate 325 (65 FE) MG EC tablet Take 325 mg by mouth in the morning and at bedtime.    Marland Kitchen GLUCOSAMINE PO Take by mouth.    . hydrochlorothiazide (HYDRODIURIL) 25 MG tablet Take 1 tablet (25 mg total) by mouth daily. 90 tablet 3  . metoprolol succinate (TOPROL-XL) 25 MG 24 hr tablet Take 1 tablet (25 mg total) by mouth daily. Dx: I10 90 tablet 0  . pravastatin (PRAVACHOL) 20 MG tablet Take 1 tablet (20 mg total) by mouth every evening. Dx: E78.2 90 tablet 0   No current facility-administered medications for this visit.    Allergies:   Patient has no  known allergies.    Social History:  The patient  reports that she quit smoking about 24 years ago. Her smoking use included cigarettes. She has never used smokeless tobacco. She reports that she does not drink alcohol or use drugs.   Family History:  The patient's family history includes Arthritis in her daughter; Heart disease (age of onset: 37) in her father; Hypertension in her mother; Obesity in her daughter; Rheumatic fever in her father; Stroke (age of onset: 21) in her mother.    ROS:  Please see the history of present illness.   Otherwise, review of systems are positive for none.   All other systems are reviewed and negative.    PHYSICAL EXAM: VS:  BP 132/68   Pulse 82   Ht 5\' 3"  (1.6 m)   Wt 165 lb (74.8 kg)   SpO2 97%   BMI 29.23 kg/m  , BMI Body mass index is 29.23 kg/m. GEN:  No distress NECK:  No jugular venous distention at 90 degrees, waveform within normal limits, carotid upstroke brisk and symmetric, no bruits, no thyromegaly LYMPHATICS:  No cervical adenopathy LUNGS:  Clear to auscultation bilaterally BACK:  No CVA tenderness CHEST:  Unremarkable HEART:  S1 and S2 within normal limits, no S3, no S4, no clicks, no rubs, no murmurs ABD:  Positive bowel sounds normal in frequency in pitch, no bruits, no rebound, no guarding, unable to assess midline mass or bruit with the patient seated. EXT:  2 plus pulses throughout, moderate edema, no cyanosis no clubbing SKIN:  No rashes no nodules NEURO:  Cranial nerves II through XII grossly intact, motor grossly intact throughout PSYCH:  Cognitively intact, oriented to person place and time   EKG:  EKG is ordered today. The ekg ordered today demonstrates sinus rhythm, rate 82, axis within normal limits, intervals within normal limits, premature atrial contractions.   Recent Labs: 07/06/2019: BNP 66.0 10/05/2019: ALT 12 11/03/2019: BUN 35; Creatinine, Ser 1.15; Hemoglobin 12.6; Platelets 306; Potassium 4.1; Sodium 139; TSH  1.610    Lipid Panel    Component Value Date/Time   CHOL 209 (H) 12/23/2018 0924   TRIG 149 12/23/2018 0924   HDL 38 (L) 12/23/2018 0924   CHOLHDL 5.5 (H) 12/23/2018 0924   CHOLHDL 3.3 02/26/2016 1043   VLDL 31 (H) 02/26/2016 1043   LDLCALC 141 (H) 12/23/2018 0924      Wt Readings from Last 3 Encounters:  11/14/19 165 lb (74.8 kg)  11/03/19 160 lb (72.6 kg)  10/05/19 166 lb (75.3 kg)      Other studies Reviewed: Additional studies/ records that were reviewed today  include: Labs. Review of the above records demonstrates:  Please see elsewhere in the note.     ASSESSMENT AND PLAN:  PALPITATIONS:   The patient does have premature atrial contractions.  I would not have any other suspicion of a significant dysrhythmia but do need to exclude atrial fibrillation being indicated by her blood pressure cuff.  Therefore, she will have montior  HTN: Her blood pressure is well controlled.  She will continue the meds as listed.  CKD IIIA: She has some mild renal insufficiency and she is seeing nephrology.  I do think she is tolerating the meds as listed however I do not think they need to be altered.  DM: A1c was 5.5.  No change in therapy.  COVID EDUCATION: She has been vaccinated.   Current medicines are reviewed at length with the patient today.  The patient does not have concerns regarding medicines.  The following changes have been made:  no change  Labs/ tests ordered today include:   Orders Placed This Encounter  Procedures  . CARDIAC EVENT MONITOR  . EKG 12-Lead     Disposition:   FU with me as needed.     Signed, Minus Breeding, MD  11/14/2019 11:09 AM    [facility]

## 2019-11-14 ENCOUNTER — Telehealth: Payer: Self-pay | Admitting: *Deleted

## 2019-11-14 ENCOUNTER — Encounter: Payer: Self-pay | Admitting: Cardiology

## 2019-11-14 ENCOUNTER — Other Ambulatory Visit: Payer: Self-pay

## 2019-11-14 ENCOUNTER — Ambulatory Visit: Payer: Medicare Other | Admitting: Cardiology

## 2019-11-14 VITALS — BP 132/68 | HR 82 | Ht 63.0 in | Wt 165.0 lb

## 2019-11-14 DIAGNOSIS — E119 Type 2 diabetes mellitus without complications: Secondary | ICD-10-CM | POA: Diagnosis not present

## 2019-11-14 DIAGNOSIS — Z7189 Other specified counseling: Secondary | ICD-10-CM | POA: Diagnosis not present

## 2019-11-14 DIAGNOSIS — R002 Palpitations: Secondary | ICD-10-CM

## 2019-11-14 NOTE — Patient Instructions (Signed)
Medication Instructions:  NO CHANGES *If you need a refill on your cardiac medications before your next appointment, please call your pharmacy*  Lab Work: NONE ORDERED THIS VISIT  Testing/Procedures: Your physician has recommended that you wear an event monitor. Event monitors are medical devices that record the heart's electrical activity. Doctors most often [place] these monitors to diagnose arrhythmias. Arrhythmias are problems with the speed or rhythm of the heartbeat. The monitor is a small, portable device. You can wear one while you do your normal daily activities. This is usually used to diagnose what is causing palpitations/syncope (passing out). Please allow 7-10 days for monitor to arrive.  Can be placed at [address], [address]. Call [phone] to schedule an appointment for placement if needed.  Follow-Up: At [facility], you and your health needs are our priority.  As part of our continuing mission to provide you with exceptional heart care, we have created designated Provider Care Teams.  These Care Teams include your primary Cardiologist (physician) and Advanced Practice Providers (APPs -  Physician Assistants and Nurse Practitioners) who all work together to provide you with the care you need, when you need it.  Your next appointment:   FOLLOW UP AS NEEDED  Other Instructions [employer] Cardiac Event Monitor Instructions Your physician has requested you wear your cardiac event monitor for 3 days. [employer] may call or text to confirm a shipping address. The monitor will be sent to a land address via UPS. [employer] will not ship a monitor to a PO BOX. It typically takes 3-5 days to receive your monitor after it has been enrolled. [employer] will assist with USPS tracking if your package is delayed. The telephone number for [employer] is [phone]. Once you have received your monitor, please review the enclosed instructions. Instruction tutorials can also  be viewed under help and settings on the enclosed cell phone. Your monitor has already been registered assigning a specific monitor serial # to you.  Applying the monitor Remove cell phone from case and turn it on. The cell phone works as Dealer and needs to be within [employer] of you at all times. The cell phone will need to be charged on a daily basis. We recommend you plug the cell phone into the enclosed charger at your bedside table every night.  Monitor batteries: You will receive two monitor batteries labelled #1 and #2. These are your recorders. Plug battery #2 onto the second connection on the enclosed charger. Keep one battery on the charger at all times. This will keep the monitor battery deactivated. It will also keep it fully charged for when you need to switch your monitor batteries. A small light will be blinking on the battery emblem when it is charging. The light on the battery emblem will remain on when the battery is fully charged.  Open package of a Monitor strip. Insert battery #1 into black hood on strip and gently squeeze monitor battery onto connection as indicated in instruction booklet. Set aside while preparing skin.  Choose location for your strip, vertical or horizontal, as indicated in the instruction booklet. Shave to remove all hair from location. There cannot be any lotions, oils, powders, or colognes on skin where monitor is to be applied. Wipe skin clean with enclosed Saline wipe. Dry skin completely.  Peel paper labeled #1 off the back of the Monitor strip exposing the adhesive. Place the monitor on the chest in the vertical or horizontal position shown in the instruction booklet.  One arrow on the monitor strip must be pointing upward. Carefully remove paper labeled #2, attaching remainder of strip to your skin. Try not to create any folds or wrinkles in the strip as you apply it.  Firmly press and release the circle in the center of the monitor  battery. You will hear a small beep. This is turning the monitor battery on. The heart emblem on the monitor battery will light up every 5 seconds if the monitor battery in turned on and connected to the patient securely. Do not push and hold the circle down as this turns the monitor battery off. The cell phone will locate the monitor battery. A screen will appear on the cell phone checking the connection of your monitor strip. This may read poor connection initially but change to good connection within the next minute. Once your monitor accepts the connection you will hear a series of 3 beeps followed by a climbing crescendo of beeps. A screen will appear on the cell phone showing the two monitor strip placement options. Touch the picture that demonstrates where you applied the monitor strip.  Your monitor strip and battery are waterproof. You are able to shower, bathe, or swim with the monitor on. They just ask you do not submerge deeper than 3 feet underwater. We recommend removing the monitor if you are swimming in a lake, river, or ocean.  Your monitor battery will need to be switched to a fully charged monitor battery approximately once a week. The cell phone will alert you of an action which needs to be made.  On the cell phone, tap for details to reveal connection status, monitor battery status, and cell phone battery status. The green dots indicates your monitor is in good status. A red dot indicates there is something that needs your attention.  To record a symptom, click the circle on the monitor battery. In 30-60 seconds a list of symptoms will appear on the cell phone. Select your symptom and tap save. Your monitor will record a sustained or significant arrhythmia regardless of you clicking the button. Some patients do not feel the heart rhythm irregularities. [employer] will notify us of any serious or critical events.  Refer to instruction booklet for instructions on switching  batteries, changing strips, the Do not disturb or Pause features, or any additional questions.  Call [employer] at [phone], to confirm your monitor is transmitting and record your baseline. They will answer any questions you may have regarding the monitor instructions at that time.  Returning the monitor to [place] all equipment back into blue box. Peel off strip of paper to expose adhesive and close box securely. There is a prepaid UPS shipping label on this box. Drop in a UPS drop box, or at a UPS facility like [employer]. You may also contact [employer] to arrange UPS to pick up monitor package at your home.

## 2019-11-14 NOTE — Telephone Encounter (Signed)
Patient enrolled for [employer] to ship a 3 day cardiac event monitor to the patients home.  Confirmed with patient duration of monitor discussed was 3 days.

## 2019-11-16 ENCOUNTER — Other Ambulatory Visit: Payer: Self-pay | Admitting: Nephrology

## 2019-11-16 DIAGNOSIS — N1832 Chronic kidney disease, stage 3b: Secondary | ICD-10-CM

## 2019-11-17 ENCOUNTER — Ambulatory Visit (INDEPENDENT_AMBULATORY_CARE_PROVIDER_SITE_OTHER): Payer: Medicare Other

## 2019-11-17 DIAGNOSIS — R002 Palpitations: Secondary | ICD-10-CM

## 2019-11-24 ENCOUNTER — Ambulatory Visit
Admission: RE | Admit: 2019-11-24 | Discharge: 2019-11-24 | Disposition: A | Discharge status: Home / Self Care | Payer: Medicare Other | Source: Ambulatory Visit | Attending: Nephrology | Admitting: Nephrology

## 2019-11-24 DIAGNOSIS — N1832 Chronic kidney disease, stage 3b: Secondary | ICD-10-CM

## 2019-12-28 ENCOUNTER — Other Ambulatory Visit: Payer: Self-pay

## 2019-12-28 DIAGNOSIS — E785 Hyperlipidemia, unspecified: Secondary | ICD-10-CM

## 2019-12-28 DIAGNOSIS — I10 Essential (primary) hypertension: Secondary | ICD-10-CM

## 2019-12-28 MED ORDER — AMLODIPINE BESYLATE 10 MG PO TABS: 10.0000 mg | ORAL_TABLET | Freq: Every day | ORAL | 0 refills | Status: DC

## 2019-12-28 MED ORDER — PRAVASTATIN SODIUM 20 MG PO TABS: 20.0000 mg | ORAL_TABLET | Freq: Every evening | ORAL | 0 refills | Status: DC

## 2019-12-28 MED ORDER — METOPROLOL SUCCINATE ER 25 MG PO TB24: 25.0000 mg | ORAL_TABLET | Freq: Every day | ORAL | 0 refills | Status: DC

## 2020-01-05 ENCOUNTER — Ambulatory Visit: Payer: Medicare Other | Admitting: Internal Medicine

## 2020-02-07 ENCOUNTER — Other Ambulatory Visit: Payer: Self-pay

## 2020-02-07 ENCOUNTER — Encounter: Payer: Self-pay | Admitting: Internal Medicine

## 2020-02-07 ENCOUNTER — Ambulatory Visit (INDEPENDENT_AMBULATORY_CARE_PROVIDER_SITE_OTHER): Payer: Medicare Other | Admitting: Internal Medicine

## 2020-02-07 VITALS — BP 149/80 | HR 87 | Temp 97.3°F | Resp 17 | Wt 162.0 lb

## 2020-02-07 DIAGNOSIS — R399 Unspecified symptoms and signs involving the genitourinary system: Secondary | ICD-10-CM | POA: Diagnosis not present

## 2020-02-07 DIAGNOSIS — E782 Mixed hyperlipidemia: Secondary | ICD-10-CM

## 2020-02-07 DIAGNOSIS — R7303 Prediabetes: Secondary | ICD-10-CM

## 2020-02-07 DIAGNOSIS — I10 Essential (primary) hypertension: Secondary | ICD-10-CM

## 2020-02-07 LAB — POCT URINALYSIS DIP (CLINITEK)
Bilirubin, UA: NEGATIVE
Glucose, UA: NEGATIVE mg/dL
Ketones, POC UA: NEGATIVE mg/dL
Nitrite, UA: NEGATIVE
POC PROTEIN,UA: NEGATIVE
Spec Grav, UA: 1.015 (ref 1.010–1.025)
Urobilinogen, UA: 0.2 E.U./dL
pH, UA: 5.5 (ref 5.0–8.0)

## 2020-02-07 MED ORDER — CEPHALEXIN 500 MG PO CAPS: 500.0000 mg | ORAL_CAPSULE | Freq: Four times a day (QID) | ORAL | 0 refills | Status: DC

## 2020-02-07 NOTE — Progress Notes (Signed)
Subjective:    Jessica [name] - 86 y.o. female MRN [id number]  Date of birth: Aug 16, 1929  HPI  Jessica [name] is here for follow up of chronic medical conditions.  Also has concerns about urinary symptoms. Has been having to urinate every 1-2 hours. Then started burning a lot with urination. No blood in urine. No fevers, vomiting, flank pain. Reports remote history of acute cystitis. She did see [facility] nephrology a few months ago and was told she had a kidney infection and was given 3 antibiotics. Reports at that time she felt well.   Chronic HTN Disease Monitoring:  Home BP Monitoring - 110-120s/ 60-70s (brings log today for review)  Chest pain- no  Dyspnea- no Headache - no  Medications: Amlodipine 10 mg, HCTZ 25 mg, Metoprolol 25 mg  Compliance- yes Lightheadedness- no  Edema- no   Health Maintenance:  Health Maintenance Due  Topic Date Due  . COVID-19 Vaccine (1) Never done  . OPHTHALMOLOGY EXAM  05/31/2019  . INFLUENZA VACCINE  01/29/2020    -  reports that she quit smoking about 24 years ago. Her smoking use included cigarettes. She has never used smokeless tobacco. - Review of Systems: Per HPI. - Past Medical History: Patient Active Problem List   Diagnosis Date Noted  . Palpitations 11/13/2019  . Stage 3 chronic kidney disease 10/05/2019  . History of iron deficiency anemia 02/16/2016  . 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 Hiatal hernia - 1o cm 11/20/2015  . BMI 30.0-30.9,adult 11/14/2014  . Lymphocytic colitis   . Overactive bladder   . Impaired hearing   . Glaucoma   . Cataracts, bilateral   . Constipation   . Back pain   . GERD (gastroesophageal reflux disease)   . Cystocele   . Low bone mass   . Prediabetes   . Osteoarthritis   . COLONIC POLYPS, ADENOMATOUS 09/09/2007  . Hyperlipidemia 09/09/2007  . HTN (hypertension) 09/09/2007   - Medications: reviewed and updated   Objective:   Physical Exam BP (!) 149/80   Pulse 87    Temp (!) 97.3 F (36.3 C) (Temporal)   Resp 17   Wt 162 lb (73.5 kg)   SpO2 95%   BMI 28.70 kg/m  Physical Exam Constitutional:      General: She is not in acute distress.    Appearance: She is not diaphoretic.  HENT:     Head: Normocephalic and atraumatic.  Eyes:     Conjunctiva/sclera: Conjunctivae normal.  Cardiovascular:     Rate and Rhythm: Normal rate and regular rhythm.     Heart sounds: Normal heart sounds. No murmur heard.   Pulmonary:     Effort: Pulmonary effort is normal. No respiratory distress.     Breath sounds: Normal breath sounds.  Musculoskeletal:        General: Normal range of motion.     Comments: No CVA tenderness.   Skin:    General: Skin is warm and dry.  Neurological:     Mental Status: She is alert and oriented to person, place, and time.  Psychiatric:        Mood and Affect: Affect normal.        Judgment: Judgment normal.            Assessment & Plan:   1. Prediabetes Last A1c 5.5%. Will repeat today.  - Hemoglobin A1c  2. Essential hypertension BP above goal but on home log very well controlled. Suspect degree of white coat  HTN. Continue current therapy. Asymptomatic.  - Lipid Panel - CBC with Differential  3. Mixed hyperlipidemia Last LDL above goal with result of 141. Repeat today. Continue Pravastatin.  - Lipid Panel   4. UTI symptoms Patient has symptoms concerning for acute cystitis. UA shows small blood and large leukocytes. No signs of pyelonephritis and stable for outpatient management. Will start empiric treatment with Keflex. Send for micro and urine culture.  - POCT URINALYSIS DIP (CLINITEK) - Urine Culture - Urinalysis, microscopic only - cephALEXin (KEFLEX) 500 MG capsule; Take 1 capsule (500 mg total) by mouth 4 (four) times daily.  Dispense: 20 capsule; Refill: 0    Phill Myron, D.O. 02/07/2020, 9:14 AM Primary Care at [facility]

## 2020-02-08 LAB — CBC WITH DIFFERENTIAL/PLATELET
Basophils Absolute: 0.1 10*3/uL (ref 0.0–0.2)
Basos: 1 %
EOS (ABSOLUTE): 0.2 10*3/uL (ref 0.0–0.4)
Eos: 2 %
Hematocrit: 38.7 % (ref 34.0–46.6)
Hemoglobin: 12.7 g/dL (ref 11.1–15.9)
Immature Grans (Abs): 0 10*3/uL (ref 0.0–0.1)
Immature Granulocytes: 0 %
Lymphocytes Absolute: 3.4 10*3/uL — ABNORMAL HIGH (ref 0.7–3.1)
Lymphs: 36 %
MCH: 31.3 pg (ref 26.6–33.0)
MCHC: 32.8 g/dL (ref 31.5–35.7)
MCV: 95 fL (ref 79–97)
Monocytes Absolute: 0.8 10*3/uL (ref 0.1–0.9)
Monocytes: 9 %
Neutrophils Absolute: 4.9 10*3/uL (ref 1.4–7.0)
Neutrophils: 52 %
Platelets: 265 10*3/uL (ref 150–450)
RBC: 4.06 x10E6/uL (ref 3.77–5.28)
RDW: 13.5 % (ref 11.7–15.4)
WBC: 9.3 10*3/uL (ref 3.4–10.8)

## 2020-02-08 LAB — LIPID PANEL
Chol/HDL Ratio: 3.4 ratio (ref 0.0–4.4)
Cholesterol, Total: 122 mg/dL (ref 100–199)
HDL: 36 mg/dL — ABNORMAL LOW (ref >39)
LDL Chol Calc (NIH): 60 mg/dL (ref 0–99)
Triglycerides: 152 mg/dL — ABNORMAL HIGH (ref 0–149)
VLDL Cholesterol Cal: 26 mg/dL (ref 5–40)

## 2020-02-08 LAB — HEMOGLOBIN A1C
Est. average glucose Bld gHb Est-mCnc: 128 mg/dL
Hgb A1c MFr Bld: 6.1 % — ABNORMAL HIGH (ref 4.8–5.6)

## 2020-02-08 LAB — URINALYSIS, MICROSCOPIC ONLY
Casts: NONE SEEN /lpf
RBC, Urine: NONE SEEN /hpf (ref 0–2)
WBC, UA: >30 /hpf — AB (ref 0–5)

## 2020-02-09 LAB — URINE CULTURE

## 2020-03-13 ENCOUNTER — Ambulatory Visit: Payer: Medicare Other | Admitting: General Surgery

## 2020-03-19 ENCOUNTER — Other Ambulatory Visit: Payer: Self-pay

## 2020-03-19 DIAGNOSIS — I10 Essential (primary) hypertension: Secondary | ICD-10-CM

## 2020-03-19 DIAGNOSIS — E785 Hyperlipidemia, unspecified: Secondary | ICD-10-CM

## 2020-03-19 MED ORDER — AMLODIPINE BESYLATE 10 MG PO TABS: 10.0000 mg | ORAL_TABLET | Freq: Every day | ORAL | 1 refills | Status: DC

## 2020-03-19 MED ORDER — PRAVASTATIN SODIUM 20 MG PO TABS: 20.0000 mg | ORAL_TABLET | Freq: Every evening | ORAL | 1 refills | Status: DC

## 2020-03-19 MED ORDER — METOPROLOL SUCCINATE ER 25 MG PO TB24: 25.0000 mg | ORAL_TABLET | Freq: Every day | ORAL | 1 refills | Status: DC

## 2020-06-26 ENCOUNTER — Other Ambulatory Visit: Payer: Self-pay | Admitting: Internal Medicine

## 2020-06-26 DIAGNOSIS — M170 Bilateral primary osteoarthritis of knee: Secondary | ICD-10-CM

## 2020-08-01 ENCOUNTER — Ambulatory Visit (INDEPENDENT_AMBULATORY_CARE_PROVIDER_SITE_OTHER): Payer: Medicare Other | Admitting: Family Medicine

## 2020-08-01 ENCOUNTER — Encounter: Payer: Self-pay | Admitting: Family Medicine

## 2020-08-01 ENCOUNTER — Other Ambulatory Visit: Payer: Self-pay

## 2020-08-01 VITALS — BP 140/70 | HR 94 | Temp 97.9°F | Ht 63.0 in | Wt 163.4 lb

## 2020-08-01 DIAGNOSIS — M170 Bilateral primary osteoarthritis of knee: Secondary | ICD-10-CM

## 2020-08-01 DIAGNOSIS — Z862 Personal history of diseases of the blood and blood-forming organs and certain disorders involving the immune mechanism: Secondary | ICD-10-CM | POA: Diagnosis not present

## 2020-08-01 DIAGNOSIS — E782 Mixed hyperlipidemia: Secondary | ICD-10-CM

## 2020-08-01 DIAGNOSIS — I10 Essential (primary) hypertension: Secondary | ICD-10-CM

## 2020-08-01 DIAGNOSIS — N3281 Overactive bladder: Secondary | ICD-10-CM | POA: Diagnosis not present

## 2020-08-01 DIAGNOSIS — H903 Sensorineural hearing loss, bilateral: Secondary | ICD-10-CM

## 2020-08-01 DIAGNOSIS — E119 Type 2 diabetes mellitus without complications: Secondary | ICD-10-CM | POA: Diagnosis not present

## 2020-08-01 DIAGNOSIS — D229 Melanocytic nevi, unspecified: Secondary | ICD-10-CM

## 2020-08-01 LAB — POCT URINALYSIS DIP (MANUAL ENTRY)
Bilirubin, UA: NEGATIVE
Blood, UA: NEGATIVE
Glucose, UA: NEGATIVE mg/dL
Ketones, POC UA: NEGATIVE mg/dL
Nitrite, UA: NEGATIVE
Protein Ur, POC: NEGATIVE mg/dL
Spec Grav, UA: 1.02 (ref 1.010–1.025)
Urobilinogen, UA: 0.2 E.U./dL
pH, UA: 5.5 (ref 5.0–8.0)

## 2020-08-01 MED ORDER — METOPROLOL SUCCINATE ER 25 MG PO TB24: 25.0000 mg | ORAL_TABLET | Freq: Every day | ORAL | 1 refills | Status: DC

## 2020-08-01 MED ORDER — HYDROCHLOROTHIAZIDE 12.5 MG PO TABS: 12.5000 mg | ORAL_TABLET | Freq: Every day | ORAL | 3 refills | Status: DC

## 2020-08-01 MED ORDER — AMLODIPINE BESYLATE 10 MG PO TABS: 10.0000 mg | ORAL_TABLET | Freq: Every day | ORAL | 1 refills | Status: DC

## 2020-08-01 MED ORDER — HYDROCODONE-ACETAMINOPHEN 7.5-325 MG PO TABS: 1.0000 | ORAL_TABLET | Freq: Four times a day (QID) | ORAL | 0 refills | Status: DC | PRN

## 2020-08-01 MED ORDER — LISINOPRIL 10 MG PO TABS: 10.0000 mg | ORAL_TABLET | Freq: Every day | ORAL | 3 refills | Status: DC

## 2020-08-01 MED ORDER — PRAVASTATIN SODIUM 20 MG PO TABS: 20.0000 mg | ORAL_TABLET | Freq: Every evening | ORAL | 1 refills | Status: DC

## 2020-08-01 NOTE — Progress Notes (Signed)
2/2/20225:37 PM  Jessica [name] 1930-03-12, 87 y.o., female [id number]  Chief Complaint  Patient presents with  . Establish Care    Kidney or bladder infection. Also needs meds refilled.     HPI:   Patient is a 87 y.o. female with past medical history significant for HTN, OA, GERD,palpitations, CKD who presents today to establish care.  [facility] Recently treated for UTI and wanted to make sure that cleared Hx arthritis Lives with daughter Doesn't drive, does housework Audiological scientist outside of house  Hard of hearing Was seen here in the past Then went to clinic [place] which was closer to her home Went to [facility] Had issues with lab at that clinic  Hip replacement in the past Her dad had rheumatic fever in the past Takes Hydrocodone/Acetaminaphen 7.5/325 for pain maybe once a week  History of skin Cancer Went to Dr. Allyson Sabal in the past Now has an abnormal mole to right hand   HTN Amlodipine 91m HCTZ started at 224mnow at 12.5 due to CKD Metoprolol 2568mas previously on Lisinopril ( stopped 4/9 due to hyperkalemia) Home BP 120-140 BP Readings from Last 3 Encounters:  08/01/20 140/70  02/07/20 (!) 149/80  11/14/19 132/68    HLD Takes a daily Asprin Pravastatin 91m71mb Results  Component Value Date   CHOL 122 02/07/2020   HDL 36 (L) 02/07/2020   LDLCALC 60 02/07/2020   TRIG 152 (H) 02/07/2020   CHOLHDL 3.4 02/07/2020    Glucosamine: Arthritis Biotin: thinning hair GERD: Nexium: hx hiatal hernia with bleeding Iron daily: will follow up with labs  Opthamology: sees regularly Denitist: sees regularly  Dermatology: Dr. LuptRosalita Chessmanammogram, colonoscopy, pap: never abnormal, no hx abnormal DEXA: 05/15/15 Health Maintenance  Topic Date Due  . URINE MICROALBUMIN  11/14/2015  . OPHTHALMOLOGY EXAM  05/31/2019  . FOOT EXAM  03/28/2020  . HEMOGLOBIN A1C  08/09/2020  . COVID-19 Vaccine (4 - Booster for Pfizer series) 11/13/2020   . TETANUS/TDAP  07/18/2024  . INFLUENZA VACCINE  Completed  . DEXA SCAN  Completed  . PNA vac Low Risk Adult  Completed    Depression screen PHQ [facility] 08/01/2020 10/05/2019 08/10/2018  Decreased Interest 0 0 0  Down, Depressed, Hopeless 0 0 0  PHQ - 2 Score 0 0 0  Altered sleeping - - 0  Tired, decreased energy - - 0  Change in appetite - - 0  Feeling bad or failure about yourself  - - 0  Trouble concentrating - - 0  Moving slowly or fidgety/restless - - 0  Suicidal thoughts - - 0  PHQ-9 Score - - 0    Fall Risk  08/01/2020 10/05/2019 08/10/2018 03/10/2018 11/13/2017  Falls in the past year? 0 0 0 No No  Number falls in past yr: 0 0 - - -  Injury with Fall? 0 0 - - -  Risk for fall due to : - Impaired balance/gait - - -  Follow up Falls evaluation completed - - - -     No Known Allergies  Prior to Admission medications   Medication Sig Start Date End Date Taking? Authorizing Provider  amLODipine (NORVASC) 10 MG tablet Take 1 tablet (10 mg total) by mouth daily. Dx: I10 03/19/20  Yes WallNicolette Bang  aspirin 81 MG tablet Take 81 mg by mouth daily.   Yes [provider]  Biotin 1 MG CAPS Take by mouth.   Yes [provider]  cycloSPORINE (RESTASIS) 0.05 % ophthalmic emulsion Place 1 drop into both eyes 2 (two) times daily.   Yes [provider]  esomeprazole (NEXIUM) 20 MG capsule Take 1 capsule (20 mg total) by mouth daily at 12 noon. 03/10/18  Yes Stallings, Zoe A, MD  ferrous sulfate 325 (65 FE) MG EC tablet Take 325 mg by mouth in the morning and at bedtime.   Yes [provider]  GLUCOSAMINE PO Take by mouth.   Yes [provider]  hydrochlorothiazide (HYDRODIURIL) 25 MG tablet Take 1 tablet (25 mg total) by mouth daily. Patient taking differently: Take 12.5 mg by mouth daily. 10/06/19  Yes Nicolette Bang, DO  metoprolol succinate (TOPROL-XL) 25 MG 24 hr tablet Take 1 tablet (25 mg total) by mouth daily. Dx: I10 03/19/20   Yes Nicolette Bang, DO  pravastatin (PRAVACHOL) 20 MG tablet Take 1 tablet (20 mg total) by mouth every evening. Dx: E78.2 03/19/20  Yes Nicolette Bang, DO  cephALEXin (KEFLEX) 500 MG capsule Take 1 capsule (500 mg total) by mouth 4 (four) times daily. 02/07/20   Nicolette Bang, DO    Past Medical History:  Diagnosis Date  . Allergy   . Back pain    herniated disc  . Basal cell carcinoma of face 11/2014   LEFT Temple  . Cataracts, bilateral    bilateral removed  . Colon polyps   . Cystocele   . Essential hypertension, benign    takes Amlodipine and Prinizide daily  . GERD (gastroesophageal reflux disease)    takes Nexium daily  . Glaucoma    pt denies  . Hyperlipidemia    takes Pravastatin daily  . Impaired hearing   . Incontinence    occasionally  . Iron deficiency anemia due to chronic blood loss 02/16/2016   Due to chronic cameron ulcers. See GI notes. Continue PPI and iron supplementation.  . Low bone mass 02/2008  . Lymphocytic colitis   . Osteoarthritis    hip, knee; severe  . Osteoporosis   . Postoperative anemia due to acute blood loss 09/26/2011  . Type II or unspecified type diabetes mellitus without mention of complication, not stated as uncontrolled 08/2010   Diet controlled    Past Surgical History:  Procedure Laterality Date  . BASAL CELL CARCINOMA EXCISION Left 12/14/2014   LEFT temple  . cataract surgery 04/25/13; 05/16/2013    . COLONOSCOPY    . JOINT REPLACEMENT N/A    Phreesia 07/29/2020  . TOE SURGERY     bilateral  . TONSILLECTOMY AND ADENOIDECTOMY    . TOTAL HIP ARTHROPLASTY  09/24/2011   Procedure: TOTAL HIP ARTHROPLASTY;  Surgeon: Ninetta Lights, MD;  Location: [facility];  Service: Orthopedics;  Laterality: Right;    Social History   Tobacco Use  . Smoking status: Former Smoker    Types: Cigarettes    Quit date: 07/07/1995    Years since quitting: 25.0  . Smokeless tobacco: Never Used  Substance Use Topics  .  Alcohol use: No    Family History  Problem Relation Age of Onset  . Stroke Mother 61  . Hypertension Mother   . Heart disease Father 3       Endocarditis  . Rheumatic fever Father   . Obesity Daughter        unable to walk very far  . Arthritis Daughter   . Anesthesia problems Neg Hx   . Hypotension Neg Hx   . Pseudochol deficiency Neg  Hx   . Malignant hyperthermia Neg Hx   . Colon cancer Neg Hx   . Esophageal cancer Neg Hx   . Pancreatic cancer Neg Hx   . Rectal cancer Neg Hx   . Stomach cancer Neg Hx     Review of Systems  Constitutional: Negative for chills, fever and malaise/fatigue.  HENT: Positive for hearing loss.   Eyes: Negative for blurred vision and double vision.  Respiratory: Negative for cough, shortness of breath and wheezing.   Cardiovascular: Negative for chest pain, palpitations and leg swelling.  Gastrointestinal: Negative for abdominal pain, blood in stool, constipation, diarrhea, heartburn, nausea and vomiting.  Genitourinary: Negative for dysuria, flank pain, frequency, hematuria and urgency.  Musculoskeletal: Negative for back pain and joint pain.  Skin: Negative for rash.  Neurological: Positive for weakness. Negative for dizziness and headaches.     OBJECTIVE:  Today's Vitals   08/01/20 0958 08/01/20 1119  BP: (!) 179/85 140/70  Pulse: 94   Temp: 97.9 F (36.6 C)   TempSrc: Temporal   SpO2: 100%   Weight: 163 lb 6.4 oz (74.1 kg)   Height: _0  (1.6 m)    Body mass index is 28.95 kg/m.   Physical Exam Constitutional:      General: She is not in acute distress.    Appearance: Normal appearance. She is not ill-appearing.  HENT:     Head: Normocephalic.  Cardiovascular:     Rate and Rhythm: Normal rate and regular rhythm.     Pulses: Normal pulses.     Heart sounds: Normal heart sounds. No murmur heard. No friction rub. No gallop.   Pulmonary:     Effort: Pulmonary effort is normal. No respiratory distress.     Breath sounds:  Normal breath sounds. No stridor. No wheezing, rhonchi or rales.  Abdominal:     General: Bowel sounds are normal. There is no distension.     Palpations: Abdomen is soft.     Tenderness: There is no abdominal tenderness. There is no right CVA tenderness, left CVA tenderness, guarding or rebound.  Musculoskeletal:     Right lower leg: No edema.     Left lower leg: No edema.  Skin:    General: Skin is warm and dry.       Neurological:     Mental Status: She is alert and oriented to person, place, and time.     Motor: Weakness present.     Gait: Gait abnormal.  Psychiatric:        Mood and Affect: Mood normal.        Behavior: Behavior normal.     Results for orders placed or performed in visit on 08/01/20 (from the past 24 hour(s))  POCT urinalysis dipstick     Status: Abnormal   Collection Time: 08/01/20 11:08 AM  Result Value Ref Range   Color, UA yellow yellow   Clarity, UA clear clear   Glucose, UA negative negative mg/dL   Bilirubin, UA negative negative   Ketones, POC UA negative negative mg/dL   Spec Grav, UA 1.020 1.010 - 1.025   Blood, UA negative negative   pH, UA 5.5 5.0 - 8.0   Protein Ur, POC negative negative mg/dL   Urobilinogen, UA 0.2 0.2 or 1.0 E.U./dL   Nitrite, UA Negative Negative   Leukocytes, UA Small (1+) (A) Negative    No results found.   ASSESSMENT and PLAN  Problem List Items Addressed This Visit  Endocrine   Type 2 diabetes mellitus without complication, without long-term current use of insulin (HCC) - Primary   Relevant Medications   pravastatin (PRAVACHOL) 20 MG tablet   Other Relevant Orders   CMP14+EGFR   Hemoglobin A1c   Microalbumin/Creatinine Ratio, Urine     Nervous and Auditory   Impaired hearing     Musculoskeletal and Integument   Osteoarthritis   Relevant Medications   HYDROcodone-acetaminophen (NORCO) 7.5-325 MG tablet     Genitourinary   Overactive bladder (Chronic)   Relevant Orders   POCT urinalysis  dipstick (Completed)   Urine Culture     Other   Hyperlipidemia   Relevant Medications   metoprolol succinate (TOPROL-XL) 25 MG 24 hr tablet   amLODipine (NORVASC) 10 MG tablet   pravastatin (PRAVACHOL) 20 MG tablet   hydrochlorothiazide (HYDRODIURIL) 12.5 MG tablet   Other Relevant Orders   Lipid Panel   History of iron deficiency anemia   Relevant Orders   CBC   Iron, TIBC and Ferritin Panel   Reticulocytes    Other Visit Diagnoses    Essential hypertension       Relevant Medications   metoprolol succinate (TOPROL-XL) 25 MG 24 hr tablet   amLODipine (NORVASC) 10 MG tablet   pravastatin (PRAVACHOL) 20 MG tablet   hydrochlorothiazide (HYDRODIURIL) 12.5 MG tablet   Atypical mole       Relevant Orders   Ambulatory referral to Dermatology     Plan . Will follow up with CBC and iron . Discussed r/se/b of daily aspirin will plan to stop . Discussed HTN and BP management: will monitor and increase metoprolol if needed . Will follow up with urine culture . Refills sent for pravastatin and pain medications   Return in about 2 months (around 09/29/2020).    Jessica Foley Larua Collier, [name] Jessica [name], [place] [address] Ph.  [phone] Fax [phone]

## 2020-08-01 NOTE — Patient Instructions (Addendum)
  Health Maintenance After Age 85 After age 85, you are at a higher risk for certain long-term diseases and infections as well as injuries from falls. Falls are a major cause of broken bones and head injuries in people who are older than age 85. Getting regular preventive care can help to keep you healthy and well. Preventive care includes getting regular testing and making lifestyle changes as recommended by your health care provider. Talk with your health care provider about:  Which screenings and tests you should have. A screening is a test that checks for a disease when you have no symptoms.  A diet and exercise plan that is right for you. What should I know about screenings and tests to prevent falls? Screening and testing are the best ways to find a health problem early. Early diagnosis and treatment give you the best chance of managing medical conditions that are common after age 85. Certain conditions and lifestyle choices may make you more likely to have a fall. Your health care provider may recommend:  Regular vision checks. Poor vision and conditions such as cataracts can make you more likely to have a fall. If you wear glasses, make sure to get your prescription updated if your vision changes.  Medicine review. Work with your health care provider to regularly review all of the medicines you are taking, including over-the-counter medicines. Ask your health care provider about any side effects that may make you more likely to have a fall. Tell your health care provider if any medicines that you take make you feel dizzy or sleepy.  Osteoporosis screening. Osteoporosis is a condition that causes the bones to get weaker. This can make the bones weak and cause them to break more easily.  Blood pressure screening. Blood pressure changes and medicines to control blood pressure can make you feel dizzy.  Strength and balance checks. Your health care provider may recommend certain tests to  check your strength and balance while standing, walking, or changing positions.  Foot health exam. Foot pain and numbness, as well as not wearing proper footwear, can make you more likely to have a fall.  Depression screening. You may be more likely to have a fall if you have a fear of falling, feel emotionally low, or feel unable to do activities that you used to do.  Alcohol use screening. Using too much alcohol can affect your balance and may make you more likely to have a fall. What actions can I take to lower my risk of falls? General instructions  Talk with your health care provider about your risks for falling. Tell your health care provider if: ? You fall. Be sure to tell your health care provider about all falls, even ones that seem minor. ? You feel dizzy, sleepy, or off-balance.  Take over-the-counter and prescription medicines only as told by your health care provider. These include any supplements.  Eat a healthy diet and maintain a healthy weight. A healthy diet includes low-fat dairy products, low-fat (lean) meats, and fiber from whole grains, beans, and lots of fruits and vegetables. Home safety  Remove any tripping hazards, such as rugs, cords, and clutter.  Install safety equipment such as grab bars in bathrooms and safety rails on stairs.  Keep rooms and walkways well-lit. Activity  Follow a regular exercise program to stay fit. This will help you maintain your balance. Ask your health care provider what types of exercise are appropriate for you.  If you need a cane   or walker, use it as recommended by your health care provider.  Wear supportive shoes that have nonskid soles.   Lifestyle  Do not drink alcohol if your health care provider tells you not to drink.  If you drink alcohol, limit how much you have: ? 0-1 drink a day for women. ? 0-2 drinks a day for men.  Be aware of how much alcohol is in your drink. In the [place] one drink equals one typical bottle  of beer (12 oz), one-half glass of wine (5 oz), or one shot of hard liquor (1 oz).  Do not use any products that contain nicotine or tobacco, such as cigarettes and e-cigarettes. If you need help quitting, ask your health care provider. Summary  Having a healthy lifestyle and getting preventive care can help to protect your health and wellness after age 85.  Screening and testing are the best way to find a health problem early and help you avoid having a fall. Early diagnosis and treatment give you the best chance for managing medical conditions that are more common for people who are older than age 85.  Falls are a major cause of broken bones and head injuries in people who are older than age 85. Take precautions to prevent a fall at home.  Work with your health care provider to learn what changes you can make to improve your health and wellness and to prevent falls. This information is not intended to replace advice given to you by your health care provider. Make sure you discuss any questions you have with your health care provider. Document Revised: 10/07/2018 Document Reviewed: 04/29/2017 Elsevier Patient Education  2021 Elsevier Inc.   If you have lab work done today you will be contacted with your lab results within the next 2 weeks.  If you have not heard from us then please contact us. The fastest way to get your results is to register for My Chart.   IF you received an x-ray today, you will receive an invoice from [facility]. Please contact [facility] at [phone] with questions or concerns regarding your invoice.   IF you received labwork today, you will receive an invoice from [facility]. Please contact [facility] at [phone] with questions or concerns regarding your invoice.   Our billing staff will not be able to assist you with questions regarding bills from these companies.  You will be contacted with the lab results as soon as they are available.  The fastest way to get your results is to activate your My Chart account. Instructions are located on the last page of this paperwork. If you have not heard from us regarding the results in 2 weeks, please contact this office.      

## 2020-08-02 ENCOUNTER — Other Ambulatory Visit: Payer: Self-pay | Admitting: Family Medicine

## 2020-08-02 LAB — IRON,TIBC AND FERRITIN PANEL
Ferritin: 306 ng/mL — ABNORMAL HIGH (ref 15–150)
Iron Saturation: 22 % (ref 15–55)
Iron: 62 ug/dL (ref 27–139)
Total Iron Binding Capacity: 276 ug/dL (ref 250–450)
UIBC: 214 ug/dL (ref 118–369)

## 2020-08-02 LAB — CBC
Hematocrit: 37.3 % (ref 34.0–46.6)
Hemoglobin: 12.4 g/dL (ref 11.1–15.9)
MCH: 30.9 pg (ref 26.6–33.0)
MCHC: 33.2 g/dL (ref 31.5–35.7)
MCV: 93 fL (ref 79–97)
Platelets: 283 10*3/uL (ref 150–450)
RBC: 4.01 x10E6/uL (ref 3.77–5.28)
RDW: 12.6 % (ref 11.7–15.4)
WBC: 11.3 10*3/uL — ABNORMAL HIGH (ref 3.4–10.8)

## 2020-08-02 LAB — LIPID PANEL
Chol/HDL Ratio: 3.2 ratio (ref 0.0–4.4)
Cholesterol, Total: 133 mg/dL (ref 100–199)
HDL: 42 mg/dL (ref >39)
LDL Chol Calc (NIH): 64 mg/dL (ref 0–99)
Triglycerides: 158 mg/dL — ABNORMAL HIGH (ref 0–149)
VLDL Cholesterol Cal: 27 mg/dL (ref 5–40)

## 2020-08-02 LAB — CMP14+EGFR
ALT: 16 IU/L (ref 0–32)
AST: 23 IU/L (ref 0–40)
Albumin/Globulin Ratio: 1.5 (ref 1.2–2.2)
Albumin: 4.8 g/dL — ABNORMAL HIGH (ref 3.6–4.6)
Alkaline Phosphatase: 118 IU/L (ref 44–121)
BUN/Creatinine Ratio: 28 (ref 12–28)
BUN: 25 mg/dL (ref 8–27)
Bilirubin Total: 0.3 mg/dL (ref 0.0–1.2)
CO2: 22 mmol/L (ref 20–29)
Calcium: 10.1 mg/dL (ref 8.7–10.3)
Chloride: 101 mmol/L (ref 96–106)
Creatinine, Ser: 0.88 mg/dL (ref 0.57–1.00)
GFR calc Af Amer: 68 mL/min/{1.73_m2} (ref >59)
GFR calc non Af Amer: 59 mL/min/{1.73_m2} — ABNORMAL LOW (ref >59)
Globulin, Total: 3.3 g/dL (ref 1.5–4.5)
Glucose: 106 mg/dL — ABNORMAL HIGH (ref 65–99)
Potassium: 4.4 mmol/L (ref 3.5–5.2)
Sodium: 140 mmol/L (ref 134–144)
Total Protein: 8.1 g/dL (ref 6.0–8.5)

## 2020-08-02 LAB — MICROALBUMIN / CREATININE URINE RATIO
Creatinine, Urine: 36.2 mg/dL
Microalb/Creat Ratio: 75 mg/g creat — ABNORMAL HIGH (ref 0–29)
Microalbumin, Urine: 27.1 ug/mL

## 2020-08-02 LAB — HEMOGLOBIN A1C
Est. average glucose Bld gHb Est-mCnc: 120 mg/dL
Hgb A1c MFr Bld: 5.8 % — ABNORMAL HIGH (ref 4.8–5.6)

## 2020-08-02 LAB — RETICULOCYTES: Retic Ct Pct: 2 % (ref 0.6–2.6)

## 2020-08-03 LAB — URINE CULTURE

## 2020-08-09 ENCOUNTER — Ambulatory Visit: Payer: Medicare Other | Admitting: Internal Medicine

## 2020-08-23 ENCOUNTER — Other Ambulatory Visit: Payer: Self-pay | Admitting: Internal Medicine

## 2020-08-23 ENCOUNTER — Encounter: Payer: Self-pay | Admitting: Family Medicine

## 2020-08-23 ENCOUNTER — Other Ambulatory Visit: Payer: Self-pay | Admitting: Family Medicine

## 2020-08-23 DIAGNOSIS — M170 Bilateral primary osteoarthritis of knee: Secondary | ICD-10-CM

## 2020-08-23 MED ORDER — DICLOFENAC SODIUM 1 % EX GEL: 4.0000 g | Freq: Four times a day (QID) | CUTANEOUS | 6 refills | Status: DC

## 2020-09-25 ENCOUNTER — Encounter: Payer: Self-pay | Admitting: Internal Medicine

## 2020-10-02 ENCOUNTER — Ambulatory Visit: Payer: Medicare Other | Admitting: Family Medicine

## 2020-10-05 ENCOUNTER — Ambulatory Visit: Payer: Medicare Other | Admitting: Internal Medicine

## 2020-10-26 ENCOUNTER — Ambulatory Visit (INDEPENDENT_AMBULATORY_CARE_PROVIDER_SITE_OTHER): Payer: Medicare Other | Admitting: Internal Medicine

## 2020-10-26 ENCOUNTER — Other Ambulatory Visit: Payer: Self-pay

## 2020-10-26 ENCOUNTER — Encounter: Payer: Self-pay | Admitting: Internal Medicine

## 2020-10-26 VITALS — BP 147/74 | HR 66 | Temp 98.1°F | Ht 62.99 in | Wt 161.2 lb

## 2020-10-26 DIAGNOSIS — M170 Bilateral primary osteoarthritis of knee: Secondary | ICD-10-CM | POA: Diagnosis not present

## 2020-10-26 DIAGNOSIS — I10 Essential (primary) hypertension: Secondary | ICD-10-CM

## 2020-10-26 MED ORDER — HYDROCODONE-ACETAMINOPHEN 7.5-325 MG PO TABS: 1.0000 | ORAL_TABLET | Freq: Four times a day (QID) | ORAL | 0 refills | Status: DC | PRN

## 2020-10-26 NOTE — Progress Notes (Signed)
Follow up.

## 2020-10-26 NOTE — Progress Notes (Signed)
Subjective:    Jessica [name] - 87 y.o. female MRN [id number]  Date of birth: 07/12/1929  HPI  Jessica [name] is here for follow up of chronic medical conditions.  Chronic HTN Disease Monitoring:  Home BP Monitoring - 120-130s/70s  Chest pain- no  Dyspnea- no Headache - no  Medications: Amlodipine 10 mg, HCTZ 25 mg, Metoprolol 25 mg  Compliance- yes Lightheadedness- no  Edema- no    Health Maintenance:  Health Maintenance Due  Topic Date Due  . OPHTHALMOLOGY EXAM  05/31/2019  . FOOT EXAM  03/28/2020    -  reports that she quit smoking about 25 years ago. Her smoking use included cigarettes. She has never used smokeless tobacco. - Review of Systems: Per HPI. - Past Medical History: Patient Active Problem List   Diagnosis Date Noted  . Type 2 diabetes mellitus without complication, without long-term current use of insulin ([place]) 08/01/2020  . Palpitations 11/13/2019  . Stage 3 chronic kidney disease ([place]) 10/05/2019  . History of iron deficiency anemia 02/16/2016  . 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 Hiatal hernia - 1o cm 11/20/2015  . BMI 30.0-30.9,adult 11/14/2014  . Lymphocytic colitis   . Overactive bladder   . Impaired hearing   . Glaucoma   . Cataracts, bilateral   . Constipation   . Back pain   . GERD (gastroesophageal reflux disease)   . Cystocele   . Low bone mass   . Prediabetes   . Osteoarthritis   . COLONIC POLYPS, ADENOMATOUS 09/09/2007  . Hyperlipidemia 09/09/2007  . HTN (hypertension) 09/09/2007   - Medications: reviewed and updated   Objective:   Physical Exam BP (!) 147/74 (BP Location: Left Arm, Patient Position: Sitting)   Pulse 66   Temp 98.1 F (36.7 C)   Ht 5' 2.99" (1.6 m)   Wt 161 lb 3.2 oz (73.1 kg)   SpO2 96%   BMI 28.56 kg/m  Physical Exam Constitutional:      General: She is not in acute distress.    Appearance: She is not diaphoretic.  HENT:     Head: Normocephalic and atraumatic.  Eyes:      Conjunctiva/sclera: Conjunctivae normal.  Cardiovascular:     Rate and Rhythm: Normal rate and regular rhythm.     Heart sounds: Normal heart sounds. No murmur heard.   Pulmonary:     Effort: Pulmonary effort is normal. No respiratory distress.     Breath sounds: Normal breath sounds.  Musculoskeletal:        General: Normal range of motion.  Skin:    General: Skin is warm and dry.  Neurological:     Mental Status: She is alert and oriented to person, place, and time.  Psychiatric:        Mood and Affect: Affect normal.        Judgment: Judgment normal.            Assessment & Plan:   1. Primary hypertension BP slightly above goal but has log that shows well controlled at home. Asymptomatic. Continue current regimen.   2. Primary osteoarthritis of both knees She presents asking for Norco for OA. Given age, not candidate for total knee replacements. Her bottle has 4 tablets in it, prescribed from almost 3 months ago so suspect appropriate use of narcotic pain medication. Will provide refill.  - HYDROcodone-acetaminophen (NORCO) 7.5-325 MG tablet; Take 1 tablet by mouth every 6 (six) hours as needed.  Dispense: 30 tablet; Refill: 0  Phill Myron, D.O. 10/26/2020, 8:56 AM Primary Care at [facility]

## 2020-10-29 ENCOUNTER — Other Ambulatory Visit: Payer: Self-pay | Admitting: Nephrology

## 2020-10-29 DIAGNOSIS — N281 Cyst of kidney, acquired: Secondary | ICD-10-CM

## 2020-11-23 ENCOUNTER — Other Ambulatory Visit: Payer: Medicare Other

## 2020-12-03 ENCOUNTER — Ambulatory Visit
Admission: RE | Admit: 2020-12-03 | Discharge: 2020-12-03 | Disposition: A | Discharge status: Home / Self Care | Payer: Medicare Other | Source: Ambulatory Visit | Attending: Nephrology | Admitting: Nephrology

## 2020-12-03 DIAGNOSIS — N281 Cyst of kidney, acquired: Secondary | ICD-10-CM

## 2021-01-27 NOTE — Progress Notes (Signed)
Patient ID: MARLINA KALANI, female    DOB: 1929/08/29  MRN: [id number]  CC: Hypertension Follow-Up  Subjective: Jessica [name] is a 87 y.o. female who presents for hypertension follow-up.   Her concerns today include:   HYPERTENSION FOLLOW-UP: 10/26/2020 per DO note: BP slightly above goal but has log that shows well controlled at home. Asymptomatic. Continue current regimen.    01/28/2021: Currently taking: see medication list Med Adherence: '[x]'$  Yes    '[]'$  No Medication side effects: '[]'$  Yes    '[x]'$  No Home Monitoring?: '[x]'$  Yes    '[]'$  No Monitoring Frequency: '[x]'$  Yes    '[]'$  No Home BP results range: '[x]'$  Yes, mostly 120's-130's/60's-70's SOB? '[]'$  Yes    '[x]'$  No Chest Pain?: '[]'$  Yes    '[x]'$  No Comments: requesting refills on cholesterol medication, doing well on current regimen   2. OSTEOARTHRITIS BOTH KNEES FOLLOW-UP: 10/26/2020 per DO note: She presents asking for Norco for OA. Given age, not candidate for total knee replacements. Her bottle has 4 tablets in it, prescribed from almost 3 months ago so suspect appropriate use of narcotic pain medication. Will provide refill.   01/28/2021: Requesting refills of Norco. Reports she is only taking once or twice weekly usually when doing housework.  Patient Active Problem List   Diagnosis Date Noted   Type 2 diabetes mellitus without complication, without long-term current use of insulin ([place]) 08/01/2020   Palpitations 11/13/2019   Stage 3 chronic kidney disease ([place]) 10/05/2019   History of iron deficiency anemia 02/16/2016   Chronic Cameron ulcers 11/20/2015   Hiatal hernia - 1o cm 11/20/2015   BMI 30.0-30.9,adult 11/14/2014   Lymphocytic colitis    Overactive bladder    Impaired hearing    Glaucoma    Cataracts, bilateral    Constipation    Back pain    GERD (gastroesophageal reflux disease)    Cystocele    Low bone mass    Prediabetes    Osteoarthritis    COLONIC POLYPS, ADENOMATOUS 09/09/2007   Hyperlipidemia 09/09/2007    HTN (hypertension) 09/09/2007     Current Outpatient Medications on File Prior to Visit  Medication Sig Dispense Refill   Biotin 1 MG CAPS Take by mouth.     cycloSPORINE (RESTASIS) 0.05 % ophthalmic emulsion Place 1 drop into both eyes 2 (two) times daily.     diclofenac Sodium (VOLTAREN) 1 % GEL Apply 4 g topically 4 (four) times daily. 50 g 6   esomeprazole (NEXIUM) 20 MG capsule Take 1 capsule (20 mg total) by mouth daily at 12 noon. 90 capsule 3   GLUCOSAMINE PO Take by mouth.     No current facility-administered medications on file prior to visit.    No Known Allergies  Social History   Socioeconomic History   Marital status: Widowed    Spouse name: n/a   Number of children: 2   Years of education: 11th grade   Highest education level: Not on file  Occupational History   Occupation: Retired Glass blower/designer  Tobacco Use   Smoking status: Former    Types: Cigarettes    Quit date: 07/07/1995    Years since quitting: 25.5   Smokeless tobacco: Never  Vaping Use   Vaping Use: Never used  Substance and Sexual Activity   Alcohol use: No   Drug use: No   Sexual activity: Never  Other Topics Concern   Not on file  Social History Narrative   Widowed - retired Glass blower/designer  [place]   Daughter (a retired Forensic psychologist) lives with patient - she has medical problems related to obesity.   Her son and his family live across the street.   2 caffeine drinks/day      Social Determinants of Health   Financial Resource Strain: Not on file  Food Insecurity: Not on file  Transportation Needs: Not on file  Physical Activity: Not on file  Stress: Not on file  Social Connections: Not on file  Intimate Partner Violence: Not on file    Family History  Problem Relation Age of Onset   Stroke Mother 42   Hypertension Mother    Heart disease Father 48       Endocarditis   Rheumatic fever Father    Obesity Daughter        unable to walk very far   Arthritis Daughter     Anesthesia problems Neg Hx    Hypotension Neg Hx    Pseudochol deficiency Neg Hx    Malignant hyperthermia Neg Hx    Colon cancer Neg Hx    Esophageal cancer Neg Hx    Pancreatic cancer Neg Hx    Rectal cancer Neg Hx    Stomach cancer Neg Hx     Past Surgical History:  Procedure Laterality Date   BASAL CELL CARCINOMA EXCISION Left 12/14/2014   LEFT temple   cataract surgery 04/25/13; 05/16/2013     COLONOSCOPY     JOINT REPLACEMENT N/A    [employer] 07/29/2020   TOE SURGERY     bilateral   TONSILLECTOMY AND ADENOIDECTOMY     TOTAL HIP ARTHROPLASTY  09/24/2011   Procedure: TOTAL HIP ARTHROPLASTY;  Surgeon: Ninetta Lights, MD;  Location: [facility];  Service: Orthopedics;  Laterality: Right;    ROS: Review of Systems Negative except as stated above  PHYSICAL EXAM: BP 126/64 (BP Location: Left Arm, Patient Position: Sitting, Cuff Size: Large)   Pulse 70   Temp 98.3 F (36.8 C)   Resp 15   Ht 5' 2.99" (1.6 m)   Wt 163 lb 12.8 oz (74.3 kg)   SpO2 96%   BMI 29.02 kg/m   Physical Exam HENT:     Head: Normocephalic and atraumatic.  Eyes:     Extraocular Movements: Extraocular movements intact.     Conjunctiva/sclera: Conjunctivae normal.     Pupils: Pupils are equal, round, and reactive to light.  Cardiovascular:     Rate and Rhythm: Normal rate and regular rhythm.     Pulses: Normal pulses.     Heart sounds: Normal heart sounds. No murmur heard. Pulmonary:     Effort: Pulmonary effort is normal.     Breath sounds: Normal breath sounds.  Musculoskeletal:     Cervical back: Normal range of motion and neck supple.  Neurological:     General: No focal deficit present.     Mental Status: She is alert and oriented to person, place, and time.  Psychiatric:        Mood and Affect: Mood normal.        Behavior: Behavior normal.   ASSESSMENT AND PLAN: 1. Essential hypertension: - Continue Hydrochlorothiazide, Metoprolol Succinate, and Amlodipine as prescribed.  - Counseled  on blood pressure goal of less than 140/90, low-sodium, DASH diet, medication compliance, 150 minutes of moderate intensity exercise per week as tolerated. Discussed medication compliance, adverse effects. - BMP to evaluate kidney function and electrolyte balance. - Follow-up with primary provider in 3 months or sooner if  needed. - Basic Metabolic Panel - hydrochlorothiazide (HYDRODIURIL) 12.5 MG tablet; Take 1 tablet (12.5 mg total) by mouth daily.  Dispense: 90 tablet; Refill: 0 - metoprolol succinate (TOPROL-XL) 25 MG 24 hr tablet; Take 1 tablet (25 mg total) by mouth daily. Dx: I10  Dispense: 90 tablet; Refill: 0 - amLODipine (NORVASC) 10 MG tablet; Take 1 tablet (10 mg total) by mouth daily. Dx: I10  Dispense: 90 tablet; Refill: 0  2. Mixed hyperlipidemia: -Practice low-fat heart healthy diet and moderate intensity exercise weekly as tolerated.  - Lipid panel last obtained 08/01/2020. - Continue Pravastatin as prescribed.  - Follow-up with primary provider as scheduled.  - pravastatin (PRAVACHOL) 20 MG tablet; Take 1 tablet (20 mg total) by mouth every evening. Dx: [id number]  Dispense: 120 tablet; Refill: 0  3. Primary osteoarthritis of both knees: - Patient presents asking for Hydrocodone-Acetaminophen refill for osteoarthritis. Given age, not candidate for total knee replacements. Her bottle has 2 tablets in it, prescribed from almost 3 months ago so suspect appropriate use of narcotic pain medication. Will provide refill.  - I did check the [facility] prescription drug database and found no frequent prescribers of opiates or evidence of aberrant behavior. - Follow-up with primary provider as scheduled.  - HYDROcodone-acetaminophen (NORCO) 7.5-325 MG tablet; Take 1 tablet by mouth every 6 (six) hours as needed.  Dispense: 30 tablet; Refill: 0   Patient was given the opportunity to ask questions.  Patient verbalized understanding of the plan and was able to repeat key elements of the plan.  Patient was given clear instructions to go to Emergency Department or return to medical center if symptoms don't improve, worsen, or new problems develop.The patient verbalized understanding.   Orders Placed This Encounter  Procedures   Basic Metabolic Panel     Requested Prescriptions   Signed Prescriptions Disp Refills   hydrochlorothiazide (HYDRODIURIL) 12.5 MG tablet 90 tablet 0    Sig: Take 1 tablet (12.5 mg total) by mouth daily.   pravastatin (PRAVACHOL) 20 MG tablet 120 tablet 0    Sig: Take 1 tablet (20 mg total) by mouth every evening. Dx: E78.2   metoprolol succinate (TOPROL-XL) 25 MG 24 hr tablet 90 tablet 0    Sig: Take 1 tablet (25 mg total) by mouth daily. Dx: I10   amLODipine (NORVASC) 10 MG tablet 90 tablet 0    Sig: Take 1 tablet (10 mg total) by mouth daily. Dx: I10   HYDROcodone-acetaminophen (NORCO) 7.5-325 MG tablet 30 tablet 0    Sig: Take 1 tablet by mouth every 6 (six) hours as needed.    Return in about 3 months (around 04/30/2021) for Follow-Up or next available hypertension and knee pain .  Camillia Herter, NP

## 2021-01-28 ENCOUNTER — Other Ambulatory Visit: Payer: Self-pay

## 2021-01-28 ENCOUNTER — Ambulatory Visit (INDEPENDENT_AMBULATORY_CARE_PROVIDER_SITE_OTHER): Payer: Medicare Other | Admitting: Family

## 2021-01-28 ENCOUNTER — Encounter: Payer: Self-pay | Admitting: Family

## 2021-01-28 VITALS — BP 126/64 | HR 70 | Temp 98.3°F | Resp 15 | Ht 62.99 in | Wt 163.8 lb

## 2021-01-28 DIAGNOSIS — E782 Mixed hyperlipidemia: Secondary | ICD-10-CM

## 2021-01-28 DIAGNOSIS — I10 Essential (primary) hypertension: Secondary | ICD-10-CM

## 2021-01-28 DIAGNOSIS — M170 Bilateral primary osteoarthritis of knee: Secondary | ICD-10-CM | POA: Diagnosis not present

## 2021-01-28 MED ORDER — PRAVASTATIN SODIUM 20 MG PO TABS: 20.0000 mg | ORAL_TABLET | Freq: Every evening | ORAL | 0 refills | Status: DC

## 2021-01-28 MED ORDER — AMLODIPINE BESYLATE 10 MG PO TABS: 10.0000 mg | ORAL_TABLET | Freq: Every day | ORAL | 0 refills | Status: DC

## 2021-01-28 MED ORDER — HYDROCODONE-ACETAMINOPHEN 7.5-325 MG PO TABS: 1.0000 | ORAL_TABLET | Freq: Four times a day (QID) | ORAL | 0 refills | Status: DC | PRN

## 2021-01-28 MED ORDER — METOPROLOL SUCCINATE ER 25 MG PO TB24: 25.0000 mg | ORAL_TABLET | Freq: Every day | ORAL | 0 refills | Status: DC

## 2021-01-28 MED ORDER — HYDROCHLOROTHIAZIDE 12.5 MG PO TABS: 12.5000 mg | ORAL_TABLET | Freq: Every day | ORAL | 0 refills | Status: DC

## 2021-01-28 NOTE — Progress Notes (Signed)
Pt presents for hypertension follow-up, needs medication refills on hctz, pravastatin, amlodipine, metoprolol, and hydrocodone

## 2021-01-29 LAB — BASIC METABOLIC PANEL
BUN/Creatinine Ratio: 27 (ref 12–28)
BUN: 24 mg/dL (ref 8–27)
CO2: 20 mmol/L (ref 20–29)
Calcium: 9.6 mg/dL (ref 8.7–10.3)
Chloride: 102 mmol/L (ref 96–106)
Creatinine, Ser: 0.88 mg/dL (ref 0.57–1.00)
Glucose: 95 mg/dL (ref 65–99)
Potassium: 4.1 mmol/L (ref 3.5–5.2)
Sodium: 140 mmol/L (ref 134–144)
eGFR: 63 mL/min/{1.73_m2} (ref >59)

## 2021-01-29 NOTE — Progress Notes (Signed)
Kidney function normal

## 2021-02-28 IMAGING — US US RENAL
1 series · 14 of 25 positions shown · non-contrast
Comparison: None.

CLINICAL DATA: Chronic kidney disease stage 3 B.

EXAM:
RENAL / URINARY TRACT ULTRASOUND COMPLETE

[Series 1: us renal · 0.25mm/px · 14 of 53 slices shown]
[im 1/53]
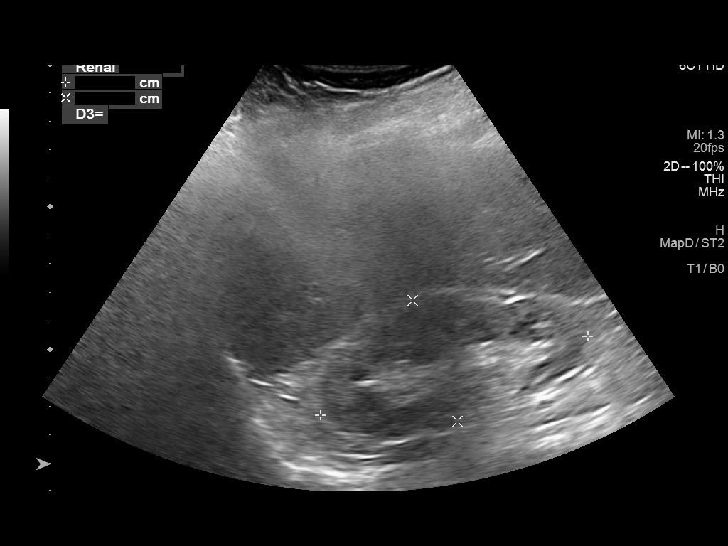
[im 5/53]
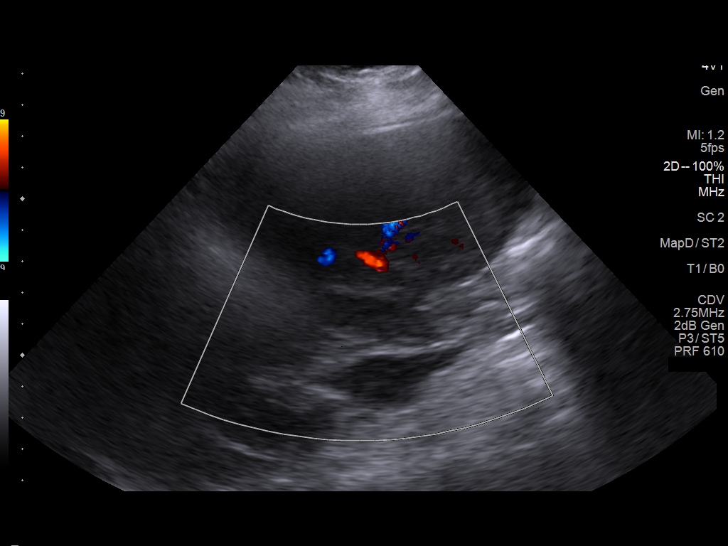
[im 9/53]
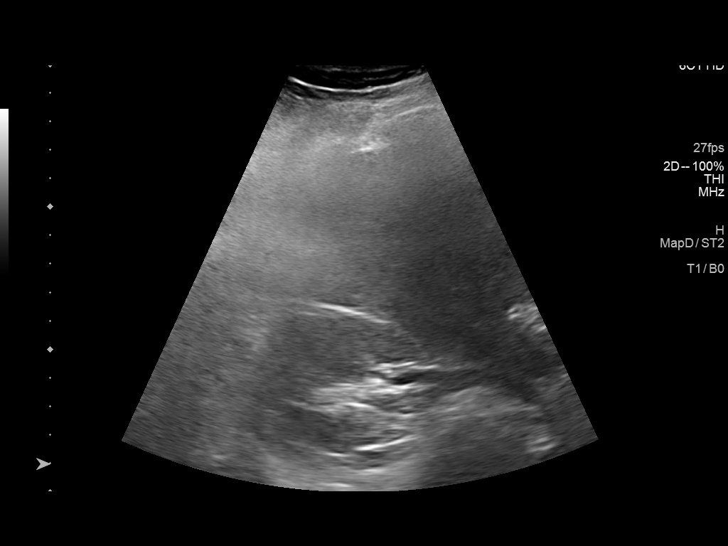
[im 14/53]
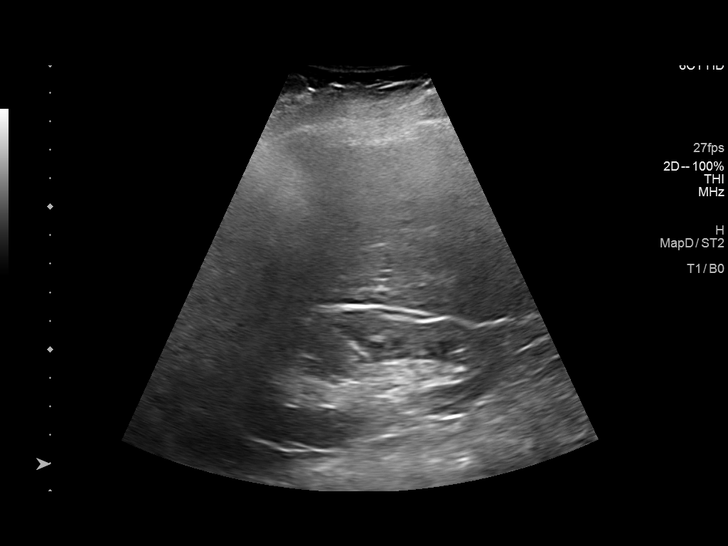
[im 18/53]
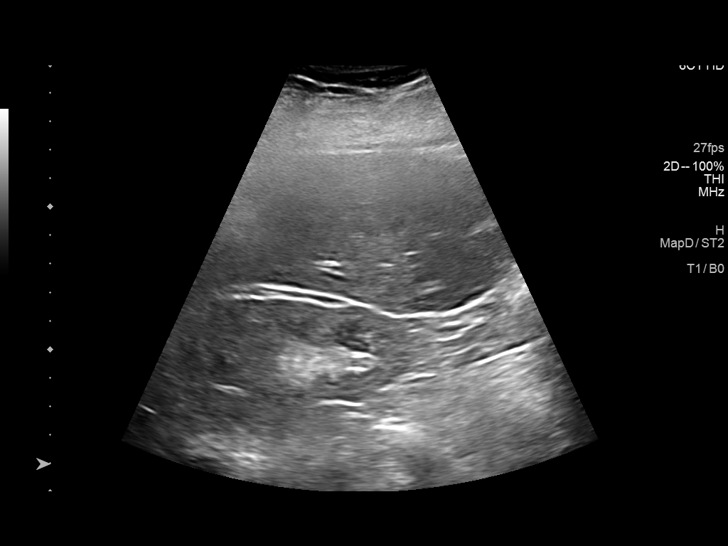
[im 20/53]
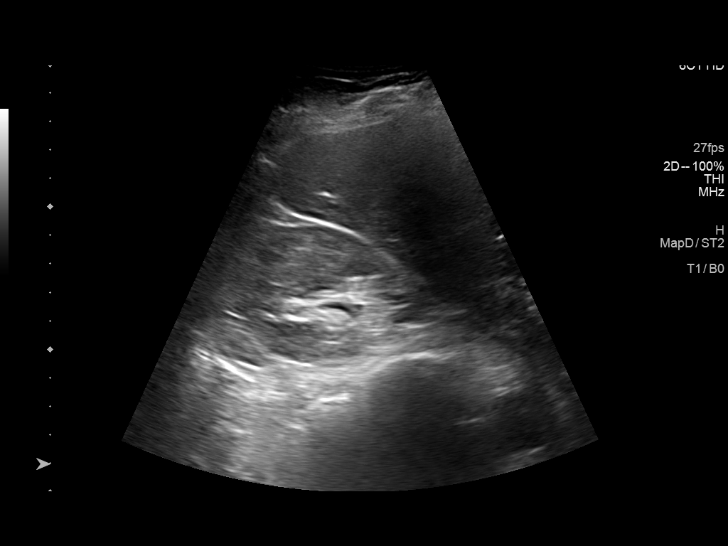
[im 24/53]
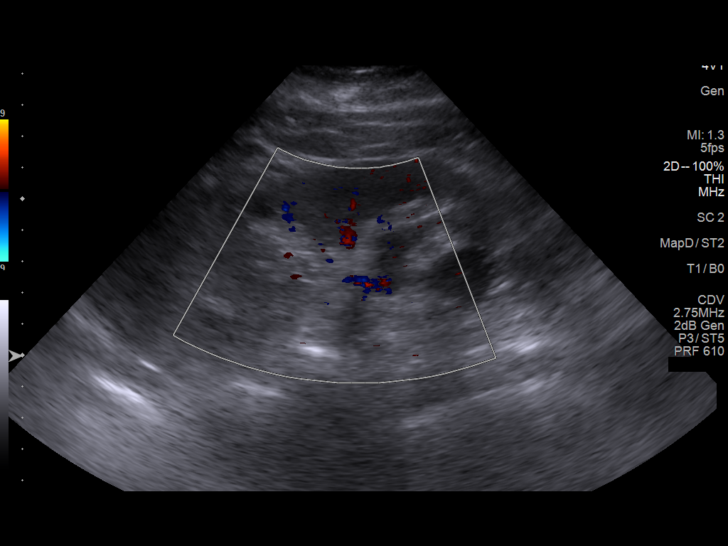
[im 29/53]
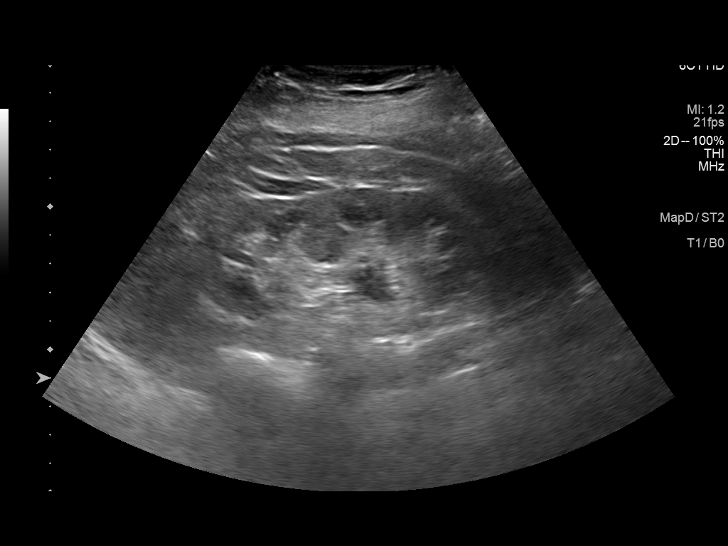
[im 33/53]
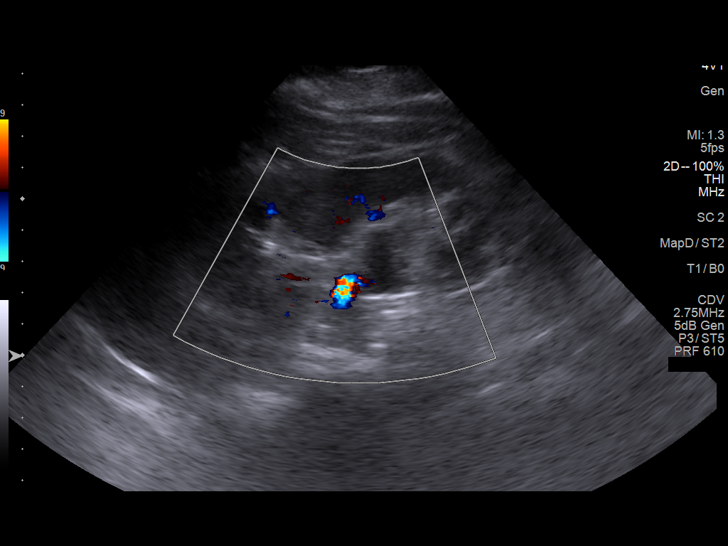
[im 35/53]
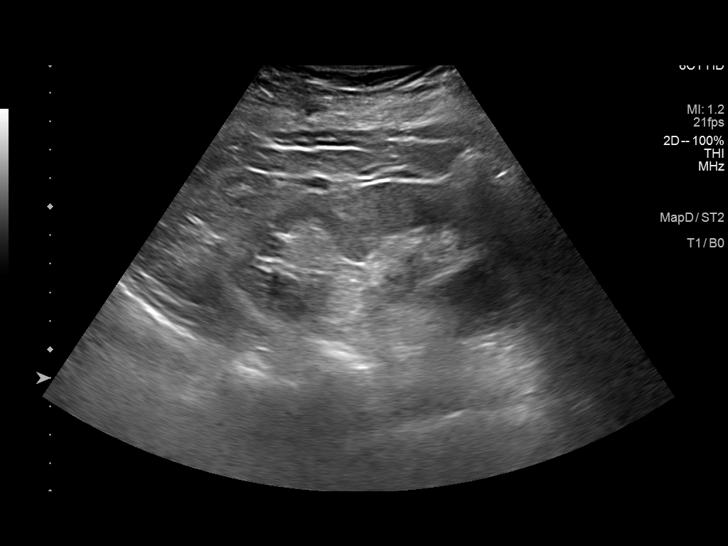
[im 40/53]
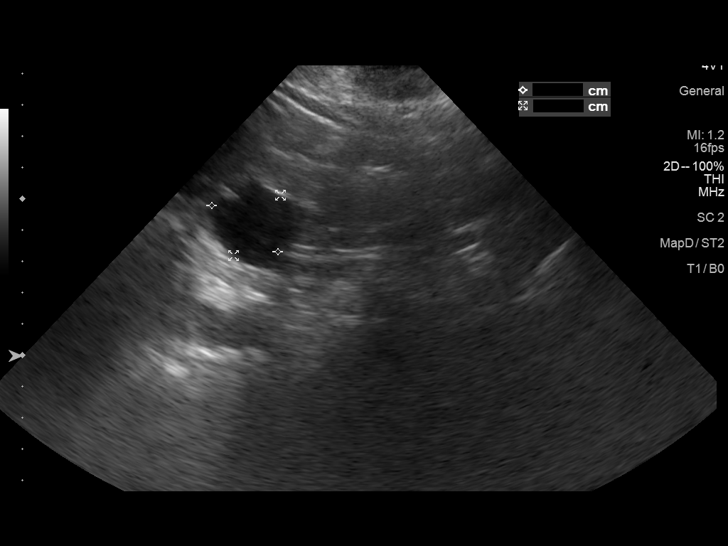
[im 44/53]
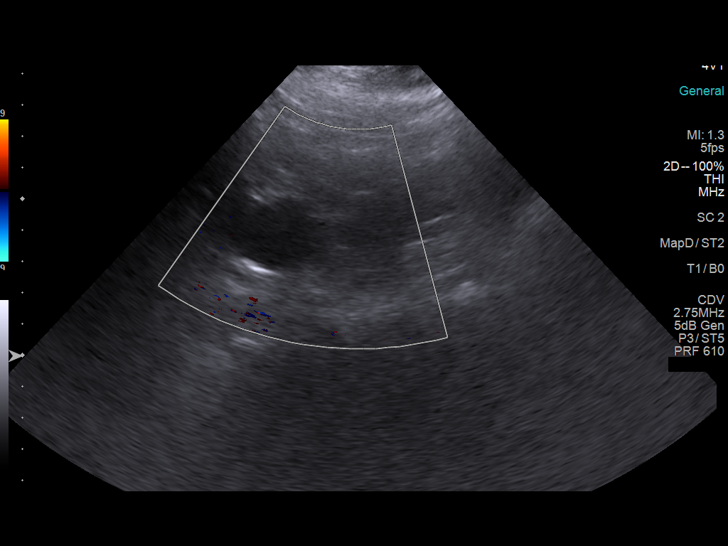
[im 48/53]
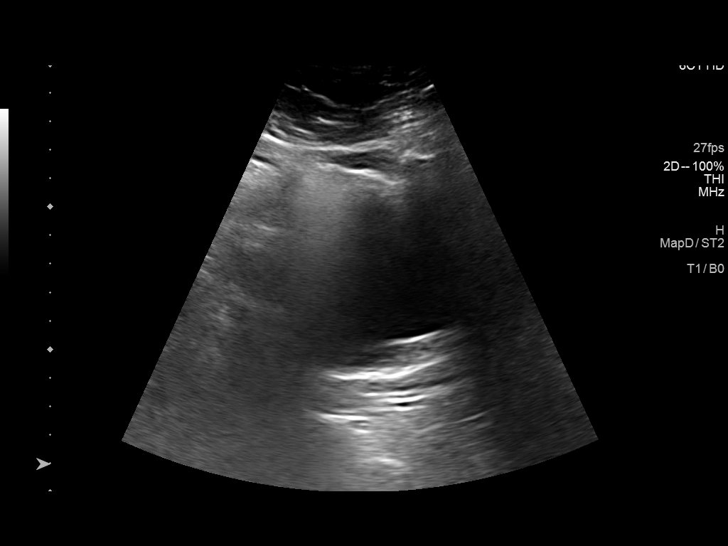
[im 53/53]
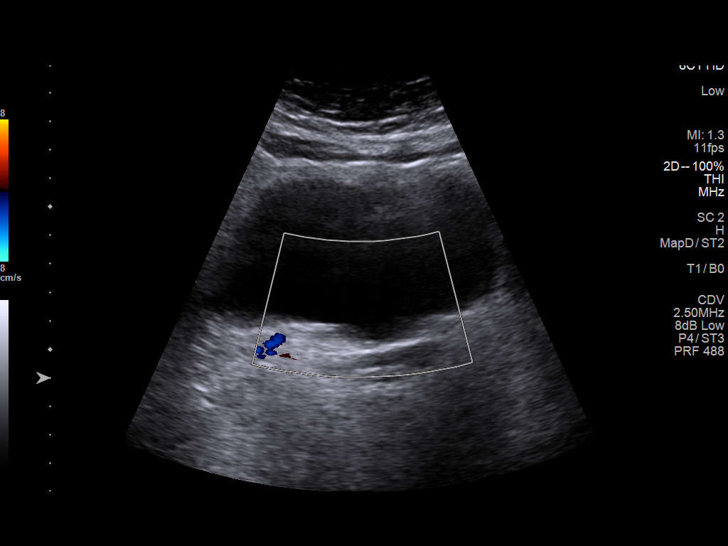

[14 of 25 positions shown; findings below may reference images not displayed]

FINDINGS: Right Kidney:

Renal measurements: 9.8 x 5.0 x 4.5 cm = volume: 115 mL. Mildly
increased echogenicity of renal parenchyma is noted suggesting
medical renal disease. No mass or hydronephrosis visualized.

Left Kidney:

Renal measurements: 9.7 x 6.0 x 5.6 cm = volume: 175 mL. Mildly
increased echogenicity of renal parenchyma is noted suggesting
medical renal disease. Possibly septated 2.9 cm cyst is seen
involving lower pole consistent with Bosniak type 2 lesion. No
hydronephrosis visualized.

Bladder:

Appears normal for degree of bladder distention. Patient was unable
to void.

Other:

None.
IMPRESSION: Mildly increased echogenicity of renal parenchyma is noted
bilaterally suggesting medical renal disease. No definite
hydronephrosis or renal obstruction is noted.

Probably septated 2.9 cm left renal cyst is noted consistent with
Bosniak type 2 lesion. Follow-up ultrasound in 1 year is recommended
to ensure stability.

## 2021-03-18 ENCOUNTER — Other Ambulatory Visit: Payer: Self-pay

## 2021-03-18 ENCOUNTER — Encounter: Payer: Self-pay | Admitting: Nurse Practitioner

## 2021-03-18 ENCOUNTER — Telehealth (INDEPENDENT_AMBULATORY_CARE_PROVIDER_SITE_OTHER): Payer: Medicare Other | Admitting: Nurse Practitioner

## 2021-03-18 ENCOUNTER — Other Ambulatory Visit: Payer: Self-pay | Admitting: Nurse Practitioner

## 2021-03-18 DIAGNOSIS — R300 Dysuria: Secondary | ICD-10-CM | POA: Diagnosis not present

## 2021-03-18 LAB — POCT URINALYSIS DIP (CLINITEK)
Bilirubin, UA: NEGATIVE
Glucose, UA: NEGATIVE mg/dL
Ketones, POC UA: NEGATIVE mg/dL
Nitrite, UA: NEGATIVE
POC PROTEIN,UA: 100 — AB
Spec Grav, UA: 1.015 (ref 1.010–1.025)
Urobilinogen, UA: 0.2 E.U./dL
pH, UA: 6 (ref 5.0–8.0)

## 2021-03-18 MED ORDER — CEPHALEXIN 500 MG PO CAPS: 500.0000 mg | ORAL_CAPSULE | Freq: Four times a day (QID) | ORAL | 0 refills | Status: AC

## 2021-03-18 NOTE — Progress Notes (Signed)
UA showed moderate blood and large leukocytes - will send for culture - will order antibiotic.

## 2021-03-18 NOTE — Patient Instructions (Signed)
Dysuria:  Stay well hydrated  May come into office for UA  Will also send out for culture   Follow up:  Follow up if needed - if symptoms worsen please go to the ED  Urinary Tract Infection, Adult A urinary tract infection (UTI) is an infection of any part of the urinary tract. The urinary tract includes: The kidneys. The ureters. The bladder. The urethra. These organs make, store, and get rid of pee (urine) in the body. What are the causes? This infection is caused by germs (bacteria) in your genital area. These germs grow and cause swelling (inflammation) of your urinary tract. What increases the risk? The following factors may make you more likely to develop this condition: Using a small, thin tube (catheter) to drain pee. Not being able to control when you pee or poop (incontinence). Being female. If you are female, these things can increase the risk: Using these methods to prevent pregnancy: A medicine that kills sperm (spermicide). A device that blocks sperm (diaphragm). Having low levels of a female hormone (estrogen). Being pregnant. You are more likely to develop this condition if: You have genes that add to your risk. You are sexually active. You take antibiotic medicines. You have trouble peeing because of: A prostate that is bigger than normal, if you are female. A blockage in the part of your body that drains pee from the bladder. A kidney stone. A nerve condition that affects your bladder. Not getting enough to drink. Not peeing often enough. You have other conditions, such as: Diabetes. A weak disease-fighting system (immune system). Sickle cell disease. Gout. Injury of the spine. What are the signs or symptoms? Symptoms of this condition include: Needing to pee right away. Peeing small amounts often. Pain or burning when peeing. Blood in the pee. Pee that smells bad or not like normal. Trouble peeing. Pee that is cloudy. Fluid coming from the  vagina, if you are female. Pain in the belly or lower back. Other symptoms include: Vomiting. Not feeling hungry. Feeling mixed up (confused). This may be the first symptom in older adults. Being tired and grouchy (irritable). A fever. Watery poop (diarrhea). How is this treated? Taking antibiotic medicine. Taking other medicines. Drinking enough water. In some cases, you may need to see a specialist. Follow these instructions at home: Medicines Take over-the-counter and prescription medicines only as told by your doctor. If you were prescribed an antibiotic medicine, take it as told by your doctor. Do not stop taking it even if you start to feel better. General instructions Make sure you: Pee until your bladder is empty. Do not hold pee for a long time. Empty your bladder after sex. Wipe from front to back after peeing or pooping if you are a female. Use each tissue one time when you wipe. Drink enough fluid to keep your pee pale yellow. Keep all follow-up visits. Contact a doctor if: You do not get better after 1-2 days. Your symptoms go away and then come back. Get help right away if: You have very bad back pain. You have very bad pain in your lower belly. You have a fever. You have chills. You feeling like you will vomit or you vomit. Summary A urinary tract infection (UTI) is an infection of any part of the urinary tract. This condition is caused by germs in your genital area. There are many risk factors for a UTI. Treatment includes antibiotic medicines. Drink enough fluid to keep your pee pale yellow. This information  is not intended to replace advice given to you by your health care provider. Make sure you discuss any questions you have with your health care provider. Document Revised: 01/27/2020 Document Reviewed: 01/27/2020 Elsevier Patient Education  [place].

## 2021-03-18 NOTE — Progress Notes (Signed)
Virtual Visit via Telephone Note  I connected with Jessica [name] on 03/18/21 at 10:10 AM EDT by telephone and verified that I am speaking with the correct person using two identifiers.  Location: Patient: home Provider: office   I discussed the limitations, risks, security and privacy concerns of performing an evaluation and management service by telephone and the availability of in person appointments. I also discussed with the patient that there may be a patient responsible charge related to this service. The patient expressed understanding and agreed to proceed.   History of Present Illness:  Patient presents today for acute visit.  She states that she has a past history of history of frequent UTIs.  She has been having dysuria over the past few days.  We discussed that she can come into the office to give a urine sample.  Patient agrees and will come in at some point today.  She denies any significant fever. Denies f/c/s, n/v/d, hemoptysis, PND, chest pain or edema.     Observations/Objective:  Vitals with BMI 01/28/2021 01/28/2021 10/26/2020  Height - 5' 2.992" 5' 2.992"  Weight - 163 lbs 13 oz 161 lbs 3 oz  BMI - [id number]  Systolic [id number]  Diastolic 64 84 74  Pulse - 70 66      Assessment and Plan:  Dysuria:  Stay well hydrated  May come into office for UA  Will also send out for culture   Follow up:  Follow up if needed - if symptoms worsen please go to the ED  Patient Instructions  Dysuria:  Stay well hydrated  May come into office for UA  Will also send out for culture   Follow up:  Follow up if needed - if symptoms worsen please go to the ED  Urinary Tract Infection, Adult A urinary tract infection (UTI) is an infection of any part of the urinary tract. The urinary tract includes: The kidneys. The ureters. The bladder. The urethra. These organs make, store, and get rid of pee (urine) in the body. What are the causes? This infection is  caused by germs (bacteria) in your genital area. These germs grow and cause swelling (inflammation) of your urinary tract. What increases the risk? The following factors may make you more likely to develop this condition: Using a small, thin tube (catheter) to drain pee. Not being able to control when you pee or poop (incontinence). Being female. If you are female, these things can increase the risk: Using these methods to prevent pregnancy: A medicine that kills sperm (spermicide). A device that blocks sperm (diaphragm). Having low levels of a female hormone (estrogen). Being pregnant. You are more likely to develop this condition if: You have genes that add to your risk. You are sexually active. You take antibiotic medicines. You have trouble peeing because of: A prostate that is bigger than normal, if you are female. A blockage in the part of your body that drains pee from the bladder. A kidney stone. A nerve condition that affects your bladder. Not getting enough to drink. Not peeing often enough. You have other conditions, such as: Diabetes. A weak disease-fighting system (immune system). Sickle cell disease. Gout. Injury of the spine. What are the signs or symptoms? Symptoms of this condition include: Needing to pee right away. Peeing small amounts often. Pain or burning when peeing. Blood in the pee. Pee that smells bad or not like normal. Trouble peeing. Pee that is cloudy. Fluid coming from the vagina,  if you are female. Pain in the belly or lower back. Other symptoms include: Vomiting. Not feeling hungry. Feeling mixed up (confused). This may be the first symptom in older adults. Being tired and grouchy (irritable). A fever. Watery poop (diarrhea). How is this treated? Taking antibiotic medicine. Taking other medicines. Drinking enough water. In some cases, you may need to see a specialist. Follow these instructions at home: Medicines Take over-the-counter  and prescription medicines only as told by your doctor. If you were prescribed an antibiotic medicine, take it as told by your doctor. Do not stop taking it even if you start to feel better. General instructions Make sure you: Pee until your bladder is empty. Do not hold pee for a long time. Empty your bladder after sex. Wipe from front to back after peeing or pooping if you are a female. Use each tissue one time when you wipe. Drink enough fluid to keep your pee pale yellow. Keep all follow-up visits. Contact a doctor if: You do not get better after 1-2 days. Your symptoms go away and then come back. Get help right away if: You have very bad back pain. You have very bad pain in your lower belly. You have a fever. You have chills. You feeling like you will vomit or you vomit. Summary A urinary tract infection (UTI) is an infection of any part of the urinary tract. This condition is caused by germs in your genital area. There are many risk factors for a UTI. Treatment includes antibiotic medicines. Drink enough fluid to keep your pee pale yellow. This information is not intended to replace advice given to you by your health care provider. Make sure you discuss any questions you have with your health care provider. Document Revised: 01/27/2020 Document Reviewed: 01/27/2020 Elsevier Patient Education  2022 [employer].       I discussed the assessment and treatment plan with the patient. The patient was provided an opportunity to ask questions and all were answered. The patient agreed with the plan and demonstrated an understanding of the instructions.   The patient was advised to call back or seek an in-person evaluation if the symptoms worsen or if the condition fails to improve as anticipated.  I provided 23 minutes of non-face-to-face time during this encounter.   Fenton Foy, NP

## 2021-03-18 NOTE — Addendum Note (Signed)
Addended by: Carilyn Goodpasture on: 03/18/2021 05:07 PM   Modules accepted: Orders

## 2021-03-22 LAB — URINE CULTURE

## 2021-03-27 ENCOUNTER — Telehealth: Payer: Self-pay | Admitting: Internal Medicine

## 2021-03-27 NOTE — Telephone Encounter (Signed)
No answer unable to leave a message for patient to call back and schedule Medicare Annual Wellness Visit (AWV) by video or phone.  No history of AWV eligible for AWVI as of 06/30/2009  60-minute appointment

## 2021-04-01 ENCOUNTER — Other Ambulatory Visit: Payer: Self-pay

## 2021-04-01 ENCOUNTER — Encounter: Payer: Self-pay | Admitting: Family Medicine

## 2021-04-01 ENCOUNTER — Ambulatory Visit (INDEPENDENT_AMBULATORY_CARE_PROVIDER_SITE_OTHER): Payer: Medicare Other | Admitting: Family Medicine

## 2021-04-01 VITALS — BP 135/72 | HR 97 | Temp 98.5°F | Resp 16 | Wt 161.2 lb

## 2021-04-01 DIAGNOSIS — N3001 Acute cystitis with hematuria: Secondary | ICD-10-CM | POA: Diagnosis not present

## 2021-04-01 DIAGNOSIS — R300 Dysuria: Secondary | ICD-10-CM

## 2021-04-01 LAB — POCT URINALYSIS DIP (CLINITEK)
Bilirubin, UA: NEGATIVE
Glucose, UA: NEGATIVE mg/dL
Ketones, POC UA: NEGATIVE mg/dL
Nitrite, UA: NEGATIVE
POC PROTEIN,UA: 30 — AB
Spec Grav, UA: 1.015 (ref 1.010–1.025)
Urobilinogen, UA: 0.2 E.U./dL
pH, UA: 5.5 (ref 5.0–8.0)

## 2021-04-01 MED ORDER — NITROFURANTOIN MONOHYD MACRO 100 MG PO CAPS: 100.0000 mg | ORAL_CAPSULE | Freq: Two times a day (BID) | ORAL | 0 refills | Status: DC

## 2021-04-01 NOTE — Progress Notes (Signed)
Patient is here c/o recurring UTI's with burning and frequency

## 2021-04-01 NOTE — Telephone Encounter (Signed)
Pt scheduled for sameday appointment

## 2021-04-02 ENCOUNTER — Encounter: Payer: Self-pay | Admitting: Family Medicine

## 2021-04-02 NOTE — Progress Notes (Signed)
Established Patient Office Visit  Subjective:  Patient ID: Jessica [name], female    DOB: 07-20-1929  Age: 87 y.o. MRN: [id number]  CC:  Chief Complaint  Patient presents with   Urinary Tract Infection    HPI Jessica [name] presents for complaint of burning with urination.  Patient reports that she has recurrent UTIs.  She is under the care of a nephrologist/urologist and has an upcoming appointment scheduled.  Patient denies any fever or chills.  Past Medical History:  Diagnosis Date   Allergy    Back pain    herniated disc   Basal cell carcinoma of face 11/2014   LEFT Temple   Cataracts, bilateral    bilateral removed   Colon polyps    Cystocele    Essential hypertension, benign    takes Amlodipine and Prinizide daily   GERD (gastroesophageal reflux disease)    takes Nexium daily   Glaucoma    pt denies   Hyperlipidemia    takes Pravastatin daily   Impaired hearing    Incontinence    occasionally   Iron deficiency anemia due to chronic blood loss 02/16/2016   Due to chronic cameron ulcers. See GI notes. Continue PPI and iron supplementation.   Low bone mass 02/2008   Lymphocytic colitis    Osteoarthritis    hip, knee; severe   Osteoporosis    Postoperative anemia due to acute blood loss 09/26/2011   Type II or unspecified type diabetes mellitus without mention of complication, not stated as uncontrolled 08/2010   Diet controlled      Social History   Socioeconomic History   Marital status: Widowed    Spouse name: n/a   Number of children: 2   Years of education: 11th grade   Highest education level: Not on file  Occupational History   Occupation: Retired Glass blower/designer  Tobacco Use   Smoking status: Former    Types: Cigarettes    Quit date: 07/07/1995    Years since quitting: 25.7   Smokeless tobacco: Never  Vaping Use   Vaping Use: Never used  Substance and Sexual Activity   Alcohol use: No   Drug use: No   Sexual activity: Never  Other  Topics Concern   Not on file  Social History Narrative   Widowed - retired Hospital doctor   Daughter (a retired Forensic psychologist) lives with patient - she has medical problems related to obesity.   Her son and his family live across the street.   2 caffeine drinks/day      Social Determinants of Health   Financial Resource Strain: Not on file  Food Insecurity: Not on file  Transportation Needs: Not on file  Physical Activity: Not on file  Stress: Not on file  Social Connections: Not on file  Intimate Partner Violence: Not on file    ROS Review of Systems  Constitutional:  Negative for fever.  Genitourinary:  Positive for dysuria.  All other systems reviewed and are negative.  Objective:   Today's Vitals: BP 135/72 (BP Location: Right Arm, Patient Position: Sitting, Cuff Size: Normal)   Pulse 97   Temp 98.5 F (36.9 C) (Oral)   Resp 16   Wt 161 lb 3.2 oz (73.1 kg)   SpO2 96%   BMI 28.56 kg/m   Physical Exam Vitals and nursing note reviewed.  Constitutional:      General: She is not in acute distress. Cardiovascular:     Rate and Rhythm:  Normal rate and regular rhythm.  Pulmonary:     Effort: Pulmonary effort is normal.     Breath sounds: Normal breath sounds.  Abdominal:     Palpations: Abdomen is soft.     Tenderness: There is no abdominal tenderness.  Neurological:     General: No focal deficit present.     Mental Status: She is alert and oriented to person, place, and time.    Assessment & Plan:   Acute cystitis with hematuria - Plan: POCT URINALYSIS DIP (CLINITEK), Urine Culturemed Urine sent for culture.  Patient prescribed Macrobid twice daily for 5 days.  Patient was encouraged to drink adequate and appropriate fluids.  Patient to keep scheduled follow-up with consultant.   Outpatient Encounter Medications as of 04/01/2021  Medication Sig   amLODipine (NORVASC) 10 MG tablet Take 1 tablet (10 mg total) by mouth daily. Dx: I10   Biotin 1 MG  CAPS Take by mouth.   cycloSPORINE (RESTASIS) 0.05 % ophthalmic emulsion Place 1 drop into both eyes 2 (two) times daily.   diclofenac Sodium (VOLTAREN) 1 % GEL Apply 4 g topically 4 (four) times daily.   esomeprazole (NEXIUM) 20 MG capsule Take 1 capsule (20 mg total) by mouth daily at 12 noon.   GLUCOSAMINE PO Take by mouth.   hydrochlorothiazide (HYDRODIURIL) 12.5 MG tablet Take 1 tablet (12.5 mg total) by mouth daily.   HYDROcodone-acetaminophen (NORCO) 7.5-325 MG tablet Take 1 tablet by mouth every 6 (six) hours as needed.   metoprolol succinate (TOPROL-XL) 25 MG 24 hr tablet Take 1 tablet (25 mg total) by mouth daily. Dx: I10   nitrofurantoin, macrocrystal-monohydrate, (MACROBID) 100 MG capsule Take 1 capsule (100 mg total) by mouth 2 (two) times daily.   pravastatin (PRAVACHOL) 20 MG tablet Take 1 tablet (20 mg total) by mouth every evening. Dx: E78.2   No facility-administered encounter medications on file as of 04/01/2021.    Follow-up: No follow-ups on file.   Becky Sax, MD

## 2021-04-06 LAB — URINE CULTURE

## 2021-04-12 ENCOUNTER — Encounter: Payer: Self-pay | Admitting: Family Medicine

## 2021-04-23 NOTE — Telephone Encounter (Signed)
Patient was called to come in a have a lab appt only. Voicemail was left

## 2021-04-27 ENCOUNTER — Other Ambulatory Visit: Payer: Self-pay | Admitting: Family

## 2021-04-27 DIAGNOSIS — E782 Mixed hyperlipidemia: Secondary | ICD-10-CM

## 2021-04-27 DIAGNOSIS — I10 Essential (primary) hypertension: Secondary | ICD-10-CM

## 2021-04-28 ENCOUNTER — Other Ambulatory Visit: Payer: Self-pay | Admitting: Family

## 2021-04-28 DIAGNOSIS — I10 Essential (primary) hypertension: Secondary | ICD-10-CM

## 2021-04-30 ENCOUNTER — Ambulatory Visit (INDEPENDENT_AMBULATORY_CARE_PROVIDER_SITE_OTHER): Payer: Medicare Other | Admitting: Family Medicine

## 2021-04-30 ENCOUNTER — Encounter: Payer: Self-pay | Admitting: Family Medicine

## 2021-04-30 ENCOUNTER — Other Ambulatory Visit: Payer: Self-pay

## 2021-04-30 VITALS — BP 134/72 | HR 73 | Temp 98.1°F | Resp 16 | Wt 158.8 lb

## 2021-04-30 DIAGNOSIS — E782 Mixed hyperlipidemia: Secondary | ICD-10-CM

## 2021-04-30 DIAGNOSIS — Z23 Encounter for immunization: Secondary | ICD-10-CM | POA: Diagnosis not present

## 2021-04-30 DIAGNOSIS — I10 Essential (primary) hypertension: Secondary | ICD-10-CM

## 2021-04-30 DIAGNOSIS — M170 Bilateral primary osteoarthritis of knee: Secondary | ICD-10-CM

## 2021-04-30 MED ORDER — DICLOFENAC SODIUM 1 % EX GEL: 4.0000 g | Freq: Four times a day (QID) | CUTANEOUS | 6 refills | Status: DC

## 2021-04-30 MED ORDER — PRAVASTATIN SODIUM 20 MG PO TABS: 20.0000 mg | ORAL_TABLET | Freq: Every evening | ORAL | 1 refills | Status: DC

## 2021-04-30 MED ORDER — METOPROLOL SUCCINATE ER 25 MG PO TB24: 25.0000 mg | ORAL_TABLET | Freq: Every day | ORAL | 1 refills | Status: DC

## 2021-04-30 MED ORDER — AMLODIPINE BESYLATE 10 MG PO TABS: 10.0000 mg | ORAL_TABLET | Freq: Every day | ORAL | 1 refills | Status: DC

## 2021-04-30 MED ORDER — HYDROCHLOROTHIAZIDE 12.5 MG PO TABS: 12.5000 mg | ORAL_TABLET | Freq: Every day | ORAL | 1 refills | Status: DC

## 2021-04-30 MED ORDER — TRIAMCINOLONE ACETONIDE 40 MG/ML IJ SUSP
40.0000 mg | Freq: Once | INTRAMUSCULAR | Status: AC
Start: 2021-04-30 — End: 2021-04-30
  Administered 2021-04-30: 40 mg via INTRAMUSCULAR

## 2021-04-30 MED ORDER — HYDROCODONE-ACETAMINOPHEN 7.5-325 MG PO TABS: 1.0000 | ORAL_TABLET | Freq: Four times a day (QID) | ORAL | 0 refills | Status: DC | PRN

## 2021-04-30 NOTE — Progress Notes (Signed)
Established  Patient Office Visit  Subjective:  Patient ID: Jessica [name], female    DOB: April 02, 1930  Age: 87 y.o. MRN: [id number]  CC:  Chief Complaint  Patient presents with   Follow-up   Hypertension   Hyperlipidemia    HPI Jessica [name] presents for follow up of chronic med issues with med refills.   Past Medical History:  Diagnosis Date   Allergy    Back pain    herniated disc   Basal cell carcinoma of face 11/2014   LEFT Temple   Cataracts, bilateral    bilateral removed   Colon polyps    Cystocele    Essential hypertension, benign    takes Amlodipine and Prinizide daily   GERD (gastroesophageal reflux disease)    takes Nexium daily   Glaucoma    pt denies   Hyperlipidemia    takes Pravastatin daily   Impaired hearing    Incontinence    occasionally   Iron deficiency anemia due to chronic blood loss 02/16/2016   Due to chronic cameron ulcers. See GI notes. Continue PPI and iron supplementation.   Low bone mass 02/2008   Lymphocytic colitis    Osteoarthritis    hip, knee; severe   Osteoporosis    Postoperative anemia due to acute blood loss 09/26/2011   Type II or unspecified type diabetes mellitus without mention of complication, not stated as uncontrolled 08/2010   Diet controlled    Past Surgical History:  Procedure Laterality Date   BASAL CELL CARCINOMA EXCISION Left 12/14/2014   LEFT temple   cataract surgery 04/25/13; 05/16/2013     COLONOSCOPY     JOINT REPLACEMENT N/A    [facility] 07/29/2020   TOE SURGERY     bilateral   TONSILLECTOMY AND ADENOIDECTOMY     TOTAL HIP ARTHROPLASTY  09/24/2011   Procedure: TOTAL HIP ARTHROPLASTY;  Surgeon: Ninetta Lights, MD;  Location: [facility];  Service: Orthopedics;  Laterality: Right;    Family History  Problem Relation Age of Onset   Stroke Mother 10   Hypertension Mother    Heart disease Father 64       Endocarditis   Rheumatic fever Father    Obesity Daughter        unable to walk very far    Arthritis Daughter    Anesthesia problems Neg Hx    Hypotension Neg Hx    Pseudochol deficiency Neg Hx    Malignant hyperthermia Neg Hx    Colon cancer Neg Hx    Esophageal cancer Neg Hx    Pancreatic cancer Neg Hx    Rectal cancer Neg Hx    Stomach cancer Neg Hx     Social History   Socioeconomic History   Marital status: Widowed    Spouse name: n/a   Number of children: 2   Years of education: 11th grade   Highest education level: Not on file  Occupational History   Occupation: Retired Glass blower/designer  Tobacco Use   Smoking status: Former    Types: Cigarettes    Quit date: 07/07/1995    Years since quitting: 25.8   Smokeless tobacco: Never  Vaping Use   Vaping Use: Never used  Substance and Sexual Activity   Alcohol use: No   Drug use: No   Sexual activity: Never  Other Topics Concern   Not on file  Social History Narrative   Widowed - retired Hospital doctor   Daughter (a retired  attorney) lives with patient - she has medical problems related to obesity.   Her son and his family live across the street.   2 caffeine drinks/day      Social Determinants of Health   Financial Resource Strain: Not on file  Food Insecurity: Not on file  Transportation Needs: Not on file  Physical Activity: Not on file  Stress: Not on file  Social Connections: Not on file  Intimate Partner Violence: Not on file    ROS Review of Systems  Cardiovascular: Negative.   Musculoskeletal:  Positive for arthralgias.  All other systems reviewed and are negative.  Objective:   Today's Vitals: BP 134/72   Pulse 73   Temp 98.1 F (36.7 C) (Oral)   Resp 16   Wt 158 lb 12.8 oz (72 kg)   SpO2 96%   BMI 28.14 kg/m   Physical Exam Vitals and nursing note reviewed.  Constitutional:      General: She is not in acute distress. Cardiovascular:     Rate and Rhythm: Normal rate and regular rhythm.  Pulmonary:     Effort: Pulmonary effort is normal.     Breath sounds:  Normal breath sounds.  Abdominal:     Palpations: Abdomen is soft.     Tenderness: There is no abdominal tenderness.  Musculoskeletal:     Right knee: Decreased range of motion.     Left knee: Decreased range of motion.     Comments: Utilizing seated walker  Neurological:     General: No focal deficit present.     Mental Status: She is alert and oriented to person, place, and time.    Assessment & Plan:   1. Essential hypertension Appears stable. Continue present management. Meds refilled.  - hydrochlorothiazide (HYDRODIURIL) 12.5 MG tablet; Take 1 tablet (12.5 mg total) by mouth daily.  Dispense: 90 tablet; Refill: 1 - metoprolol succinate (TOPROL-XL) 25 MG 24 hr tablet; Take 1 tablet (25 mg total) by mouth daily. Dx: I10  Dispense: 90 tablet; Refill: 1 - amLODipine (NORVASC) 10 MG tablet; Take 1 tablet (10 mg total) by mouth daily. Dx: I10  Dispense: 90 tablet; Refill: 1  2. Primary osteoarthritis of both knees Kenalog IM injection given. Hydrocodone and voltaren gel prescribed.  - HYDROcodone-acetaminophen (NORCO) 7.5-325 MG tablet; Take 1 tablet by mouth every 6 (six) hours as needed.  Dispense: 30 tablet; Refill: 0 - triamcinolone acetonide (KENALOG-40) injection 40 mg - diclofenac Sodium (VOLTAREN) 1 % GEL; Apply 4 g topically 4 (four) times daily.  Dispense: 50 g; Refill: 6  3. Mixed hyperlipidemia Continue present management.  - pravastatin (PRAVACHOL) 20 MG tablet; Take 1 tablet (20 mg total) by mouth every evening. Dx: O75.6  Dispense: 90 tablet; Refill: 1  4. Need for influenza vaccination  - Flu Vaccine QUAD 50mo+IM (Fluarix, Fluzone & Alfiuria Quad PF)  Outpatient Encounter Medications as of 04/30/2021  Medication Sig   Biotin 1 MG CAPS Take by mouth.   cycloSPORINE (RESTASIS) 0.05 % ophthalmic emulsion Place 1 drop into both eyes 2 (two) times daily.   esomeprazole (NEXIUM) 20 MG capsule Take 1 capsule (20 mg total) by mouth daily at 12 noon.   GLUCOSAMINE PO Take  by mouth.   [DISCONTINUED] amLODipine (NORVASC) 10 MG tablet Take 1 tablet (10 mg total) by mouth daily. Dx: I10   [DISCONTINUED] diclofenac Sodium (VOLTAREN) 1 % GEL Apply 4 g topically 4 (four) times daily.   [DISCONTINUED] hydrochlorothiazide (HYDRODIURIL) 12.5 MG tablet Take 1 tablet (  12.5 mg total) by mouth daily.   [DISCONTINUED] HYDROcodone-acetaminophen (NORCO) 7.5-325 MG tablet Take 1 tablet by mouth every 6 (six) hours as needed.   [DISCONTINUED] metoprolol succinate (TOPROL-XL) 25 MG 24 hr tablet Take 1 tablet (25 mg total) by mouth daily. Dx: I10   [DISCONTINUED] pravastatin (PRAVACHOL) 20 MG tablet Take 1 tablet (20 mg total) by mouth every evening. Dx: E78.2   amLODipine (NORVASC) 10 MG tablet Take 1 tablet (10 mg total) by mouth daily. Dx: I10   diclofenac Sodium (VOLTAREN) 1 % GEL Apply 4 g topically 4 (four) times daily.   hydrochlorothiazide (HYDRODIURIL) 12.5 MG tablet Take 1 tablet (12.5 mg total) by mouth daily.   HYDROcodone-acetaminophen (NORCO) 7.5-325 MG tablet Take 1 tablet by mouth every 6 (six) hours as needed.   metoprolol succinate (TOPROL-XL) 25 MG 24 hr tablet Take 1 tablet (25 mg total) by mouth daily. Dx: I10   pravastatin (PRAVACHOL) 20 MG tablet Take 1 tablet (20 mg total) by mouth every evening. Dx: E78.2   [DISCONTINUED] nitrofurantoin, macrocrystal-monohydrate, (MACROBID) 100 MG capsule Take 1 capsule (100 mg total) by mouth 2 (two) times daily.   [EXPIRED] triamcinolone acetonide (KENALOG-40) injection 40 mg    No facility-administered encounter medications on file as of 04/30/2021.    Follow-up: Return in about 6 months (around 10/28/2021) for follow up, chronic med issues.   Becky Sax, MD

## 2021-10-28 ENCOUNTER — Ambulatory Visit (INDEPENDENT_AMBULATORY_CARE_PROVIDER_SITE_OTHER): Payer: Medicare Other | Admitting: Family Medicine

## 2021-10-28 ENCOUNTER — Encounter: Payer: Self-pay | Admitting: Family Medicine

## 2021-10-28 DIAGNOSIS — E782 Mixed hyperlipidemia: Secondary | ICD-10-CM

## 2021-10-28 DIAGNOSIS — M170 Bilateral primary osteoarthritis of knee: Secondary | ICD-10-CM

## 2021-10-28 DIAGNOSIS — I10 Essential (primary) hypertension: Secondary | ICD-10-CM | POA: Diagnosis not present

## 2021-10-28 MED ORDER — PRAVASTATIN SODIUM 20 MG PO TABS: 20.0000 mg | ORAL_TABLET | Freq: Every evening | ORAL | 1 refills | Status: DC

## 2021-10-28 MED ORDER — HYDROCODONE-ACETAMINOPHEN 7.5-325 MG PO TABS: 1.0000 | ORAL_TABLET | Freq: Four times a day (QID) | ORAL | 0 refills | Status: DC | PRN

## 2021-10-28 MED ORDER — AMLODIPINE BESYLATE 10 MG PO TABS: 10.0000 mg | ORAL_TABLET | Freq: Every day | ORAL | 1 refills | Status: DC

## 2021-10-28 MED ORDER — HYDROCHLOROTHIAZIDE 12.5 MG PO TABS: 12.5000 mg | ORAL_TABLET | Freq: Every day | ORAL | 1 refills | Status: DC

## 2021-10-28 MED ORDER — METOPROLOL SUCCINATE ER 25 MG PO TB24: 25.0000 mg | ORAL_TABLET | Freq: Every day | ORAL | 1 refills | Status: DC

## 2021-10-28 MED ORDER — PREDNISONE 20 MG PO TABS: 40.0000 mg | ORAL_TABLET | Freq: Every day | ORAL | 0 refills | Status: AC

## 2021-10-28 NOTE — Progress Notes (Signed)
Patient would like to strop going to [place] [facility]. Patient said she only wants to go back if she has to but would  like all her kidney problems to be handle by PCP ? ?Patient would like to talk about her knee pain that hs been present for awhile. ?

## 2021-10-29 ENCOUNTER — Encounter: Payer: Self-pay | Admitting: Family Medicine

## 2021-10-29 NOTE — Progress Notes (Signed)
? ?Established Patient Office Visit ? ?Subjective   ? ?Patient ID: Jessica [name], female    DOB: 08-01-1929  Age: 88 y.o. MRN: [id number] ? ?CC:  ?Chief Complaint  ?Patient presents with  ? Follow-up  ? Hypertension  ? Hyperlipidemia  ? ? ?HPI ?Jessica [name] presents for routine follow up of chronic med issues including hypertension. Patient also reports some increase sx lately in her right knee. Denies known recent trauma or injury.  ? ? ?Outpatient Encounter Medications as of 10/28/2021  ?Medication Sig  ? Biotin 1 MG CAPS Take by mouth.  ? cycloSPORINE (RESTASIS) 0.05 % ophthalmic emulsion Place 1 drop into both eyes 2 (two) times daily.  ? esomeprazole (NEXIUM) 20 MG capsule Take 1 capsule (20 mg total) by mouth daily at 12 noon.  ? GLUCOSAMINE PO Take by mouth.  ? predniSONE (DELTASONE) 20 MG tablet Take 2 tablets (40 mg total) by mouth daily with breakfast for 5 days.  ? [DISCONTINUED] amLODipine (NORVASC) 10 MG tablet Take 1 tablet (10 mg total) by mouth daily. Dx: I10  ? [DISCONTINUED] hydrochlorothiazide (HYDRODIURIL) 12.5 MG tablet Take 1 tablet (12.5 mg total) by mouth daily.  ? [DISCONTINUED] HYDROcodone-acetaminophen (NORCO) 7.5-325 MG tablet Take 1 tablet by mouth every 6 (six) hours as needed.  ? [DISCONTINUED] metoprolol succinate (TOPROL-XL) 25 MG 24 hr tablet Take 1 tablet (25 mg total) by mouth daily. Dx: I10  ? [DISCONTINUED] pravastatin (PRAVACHOL) 20 MG tablet Take 1 tablet (20 mg total) by mouth every evening. Dx: Y60.6  ? amLODipine (NORVASC) 10 MG tablet Take 1 tablet (10 mg total) by mouth daily. Dx: I10  ? hydrochlorothiazide (HYDRODIURIL) 12.5 MG tablet Take 1 tablet (12.5 mg total) by mouth daily.  ? HYDROcodone-acetaminophen (NORCO) 7.5-325 MG tablet Take 1 tablet by mouth every 6 (six) hours as needed.  ? metoprolol succinate (TOPROL-XL) 25 MG 24 hr tablet Take 1 tablet (25 mg total) by mouth daily. Dx: I10  ? pravastatin (PRAVACHOL) 20 MG tablet Take 1 tablet (20 mg total) by  mouth every evening. Dx: T01.6  ? [DISCONTINUED] diclofenac Sodium (VOLTAREN) 1 % GEL Apply 4 g topically 4 (four) times daily.  ? ?No facility-administered encounter medications on file as of 10/28/2021.  ? ? ?Past Medical History:  ?Diagnosis Date  ? Allergy   ? Back pain   ? herniated disc  ? Basal cell carcinoma of face 11/2014  ? LEFT Temple  ? Cataracts, bilateral   ? bilateral removed  ? Colon polyps   ? Cystocele   ? Essential hypertension, benign   ? takes Amlodipine and Prinizide daily  ? GERD (gastroesophageal reflux disease)   ? takes Nexium daily  ? Glaucoma   ? pt denies  ? Hyperlipidemia   ? takes Pravastatin daily  ? Impaired hearing   ? Incontinence   ? occasionally  ? Iron deficiency anemia due to chronic blood loss 02/16/2016  ? Due to chronic cameron ulcers. See GI notes. Continue PPI and iron supplementation.  ? Low bone mass 02/2008  ? Lymphocytic colitis   ? Osteoarthritis   ? hip, knee; severe  ? Osteoporosis   ? Postoperative anemia due to acute blood loss 09/26/2011  ? Type II or unspecified type diabetes mellitus without mention of complication, not stated as uncontrolled 08/2010  ? Diet controlled  ? ? ?Past Surgical History:  ?Procedure Laterality Date  ? BASAL CELL CARCINOMA EXCISION Left 12/14/2014  ? LEFT temple  ? cataract surgery 04/25/13; 05/16/2013    ?  COLONOSCOPY    ? JOINT REPLACEMENT N/A   ? [facility] 07/29/2020  ? TOE SURGERY    ? bilateral  ? TONSILLECTOMY AND ADENOIDECTOMY    ? TOTAL HIP ARTHROPLASTY  09/24/2011  ? Procedure: TOTAL HIP ARTHROPLASTY;  Surgeon: Ninetta Lights, MD;  Location: [facility];  Service: Orthopedics;  Laterality: Right;  ? ? ?Family History  ?Problem Relation Age of Onset  ? Stroke Mother 48  ? Hypertension Mother   ? Heart disease Father 67  ?     Endocarditis  ? Rheumatic fever Father   ? Obesity Daughter   ?     unable to walk very far  ? Arthritis Daughter   ? Anesthesia problems Neg Hx   ? Hypotension Neg Hx   ? Pseudochol deficiency Neg Hx   ? Malignant  hyperthermia Neg Hx   ? Colon cancer Neg Hx   ? Esophageal cancer Neg Hx   ? Pancreatic cancer Neg Hx   ? Rectal cancer Neg Hx   ? Stomach cancer Neg Hx   ? ? ?Social History  ? ?Socioeconomic History  ? Marital status: Widowed  ?  Spouse name: n/a  ? Number of children: 2  ? Years of education: 11th grade  ? Highest education level: Not on file  ?Occupational History  ? Occupation: Retired Glass blower/designer  ?Tobacco Use  ? Smoking status: Former  ?  Types: Cigarettes  ?  Quit date: 07/07/1995  ?  Years since quitting: 26.3  ? Smokeless tobacco: Never  ?Vaping Use  ? Vaping Use: Never used  ?Substance and Sexual Activity  ? Alcohol use: No  ? Drug use: No  ? Sexual activity: Never  ?Other Topics Concern  ? Not on file  ?Social History Narrative  ? Widowed - retired Hospital doctor  ? Daughter (a retired Forensic psychologist) lives with patient - she has medical problems related to obesity.  ? Her son and his family live across the street.  ? 2 caffeine drinks/day  ?   ? ?Social Determinants of Health  ? ?Financial Resource Strain: Not on file  ?Food Insecurity: Not on file  ?Transportation Needs: Not on file  ?Physical Activity: Not on file  ?Stress: Not on file  ?Social Connections: Not on file  ?Intimate Partner Violence: Not on file  ? ? ?Review of Systems  ?All other systems reviewed and are negative. ? ?  ? ? ?Objective   ? ?BP (!) 150/74   Pulse 72   Temp 98 ?F (36.7 ?C) (Oral)   Resp 16   Wt 169 lb 6.4 oz (76.8 kg)   SpO2 96%   BMI 30.02 kg/m?  ? ?Physical Exam ?Vitals and nursing note reviewed.  ?Constitutional:   ?   General: She is not in acute distress. ?Cardiovascular:  ?   Rate and Rhythm: Normal rate and regular rhythm.  ?Pulmonary:  ?   Effort: Pulmonary effort is normal.  ?   Breath sounds: Normal breath sounds.  ?Abdominal:  ?   Palpations: Abdomen is soft.  ?   Tenderness: There is no abdominal tenderness.  ?Musculoskeletal:  ?   Right knee: Decreased range of motion.  ?   Left knee:  Decreased range of motion.  ?   Comments: Utilizing seated walker  ?Neurological:  ?   General: No focal deficit present.  ?   Mental Status: She is alert and oriented to person, place, and time.  ? ? ? ?  ? ?  Assessment & Plan:  ? ?1. Essential hypertension ?Slightly elevated reading. Meds refilled. Patient defers further visits with nephrology at this time (labs are  unremarkable) ?- hydrochlorothiazide (HYDRODIURIL) 12.5 MG tablet; Take 1 tablet (12.5 mg total) by mouth daily.  Dispense: 90 tablet; Refill: 1 ?- metoprolol succinate (TOPROL-XL) 25 MG 24 hr tablet; Take 1 tablet (25 mg total) by mouth daily. Dx: I10  Dispense: 90 tablet; Refill: 1 ?- amLODipine (NORVASC) 10 MG tablet; Take 1 tablet (10 mg total) by mouth daily. Dx: I10  Dispense: 90 tablet; Refill: 1 ?- Microalbumin / creatinine urine ratio ? ?2. Mixed hyperlipidemia ?Meds refilled. Continue present management.  ?- pravastatin (PRAVACHOL) 20 MG tablet; Take 1 tablet (20 mg total) by mouth every evening. Dx: B14.7  Dispense: 90 tablet; Refill: 1 ? ?3. Primary osteoarthritis of both knees ?Prednisone prescribed. Norco refilled.  ?- HYDROcodone-acetaminophen (NORCO) 7.5-325 MG tablet; Take 1 tablet by mouth every 6 (six) hours as needed.  Dispense: 30 tablet; Refill: 0 ? ? ? ?Return in about 6 months (around 04/30/2022) for follow up.  ? ?Becky Sax, MD ? ? ?

## 2022-01-02 ENCOUNTER — Encounter: Payer: Self-pay | Admitting: Family Medicine

## 2022-01-02 NOTE — Telephone Encounter (Signed)
Lease advise patient

## 2022-01-06 ENCOUNTER — Telehealth: Payer: Self-pay | Admitting: Family Medicine

## 2022-01-06 NOTE — Telephone Encounter (Signed)
Copied from [place] [id number] [id number]. Topic: General - Other >> Jan 06, 2022  4:23 PM Leilani Able wrote: Message to Dalbert [place], This pt is having problems with her phone and is wanting a response to her MyChart message this am,  I told her since having phone issues to read her MyChart but she does not see any message but hers. She wants another CB from nurse, however she stated her phone has not rang today. I tried to reach office twice while I had her but no pu. Pls fu with pt at [phone]. She states to keep trying she will be trying to answer.

## 2022-01-06 NOTE — Telephone Encounter (Signed)
I have attempted without success to contact this patient by phone to return their call.  Called pt to  make appt/ no answer

## 2022-01-07 NOTE — Telephone Encounter (Signed)
Patient has been called back and would rather not have an injection. Patient said the injection did not help her last time

## 2022-04-30 ENCOUNTER — Ambulatory Visit (INDEPENDENT_AMBULATORY_CARE_PROVIDER_SITE_OTHER): Payer: Medicare Other | Admitting: Family Medicine

## 2022-04-30 VITALS — BP 137/78 | HR 97 | Temp 98.1°F | Resp 16 | Ht 63.0 in | Wt 168.4 lb

## 2022-04-30 DIAGNOSIS — E119 Type 2 diabetes mellitus without complications: Secondary | ICD-10-CM | POA: Diagnosis not present

## 2022-04-30 DIAGNOSIS — M170 Bilateral primary osteoarthritis of knee: Secondary | ICD-10-CM

## 2022-04-30 DIAGNOSIS — E782 Mixed hyperlipidemia: Secondary | ICD-10-CM

## 2022-04-30 DIAGNOSIS — Z0000 Encounter for general adult medical examination without abnormal findings: Secondary | ICD-10-CM | POA: Diagnosis not present

## 2022-04-30 DIAGNOSIS — Z23 Encounter for immunization: Secondary | ICD-10-CM

## 2022-04-30 DIAGNOSIS — I10 Essential (primary) hypertension: Secondary | ICD-10-CM | POA: Diagnosis not present

## 2022-04-30 LAB — POCT GLYCOSYLATED HEMOGLOBIN (HGB A1C): Hemoglobin A1C: 5.6 % (ref 4.0–5.6)

## 2022-04-30 MED ORDER — HYDROCHLOROTHIAZIDE 12.5 MG PO TABS: 12.5000 mg | ORAL_TABLET | Freq: Every day | ORAL | 1 refills | Status: DC

## 2022-04-30 MED ORDER — PRAVASTATIN SODIUM 20 MG PO TABS: 20.0000 mg | ORAL_TABLET | Freq: Every evening | ORAL | 1 refills | Status: DC

## 2022-04-30 MED ORDER — HYDROCODONE-ACETAMINOPHEN 7.5-325 MG PO TABS: 1.0000 | ORAL_TABLET | Freq: Four times a day (QID) | ORAL | 0 refills | Status: DC | PRN

## 2022-04-30 MED ORDER — PREDNISONE 50 MG PO TABS: 50.0000 mg | ORAL_TABLET | Freq: Every day | ORAL | 0 refills | Status: DC

## 2022-04-30 MED ORDER — METOPROLOL SUCCINATE ER 25 MG PO TB24: 25.0000 mg | ORAL_TABLET | Freq: Every day | ORAL | 1 refills | Status: DC

## 2022-04-30 MED ORDER — AMLODIPINE BESYLATE 10 MG PO TABS: 10.0000 mg | ORAL_TABLET | Freq: Every day | ORAL | 1 refills | Status: DC

## 2022-04-30 NOTE — Progress Notes (Unsigned)
Subjective:   Jessica [name] is a 88 y.o. female who presents for Medicare Annual (Subsequent) preventive examination.  Review of Systems    Defer to Pcp       Objective:    Today's Vitals   04/30/22 1044  BP: 137/78  Pulse: 97  Resp: 16  Temp: 98.1 F (36.7 C)  TempSrc: Oral  Weight: 168 lb 6.4 oz (76.4 kg)  Height: '5\' 3"'$  (1.6 m)   Body mass index is 29.83 kg/m.     11/25/2016   10:32 AM 12/20/2013    3:47 PM 12/20/2013   10:19 AM 09/18/2011   11:10 AM  Advanced Directives  Does Patient Have a Medical Advance Directive? No  Patient does not have advance directive;Patient would like information Patient does not have advance directive;Patient would not like information  Would patient like information on creating a medical advance directive?   Advance directive brochure given (Outpatient ONLY)   Pre-existing out of facility DNR order (yellow form or pink MOST form)  Other (comment)  No    Current Medications (verified) Outpatient Encounter Medications as of 04/30/2022  Medication Sig   amLODipine (NORVASC) 10 MG tablet Take 1 tablet (10 mg total) by mouth daily. Dx: I10   Biotin 1 MG CAPS Take by mouth.   cycloSPORINE (RESTASIS) 0.05 % ophthalmic emulsion Place 1 drop into both eyes 2 (two) times daily.   esomeprazole (NEXIUM) 20 MG capsule Take 1 capsule (20 mg total) by mouth daily at 12 noon.   GLUCOSAMINE PO Take by mouth.   hydrochlorothiazide (HYDRODIURIL) 12.5 MG tablet Take 1 tablet (12.5 mg total) by mouth daily.   HYDROcodone-acetaminophen (NORCO) 7.5-325 MG tablet Take 1 tablet by mouth every 6 (six) hours as needed.   metoprolol succinate (TOPROL-XL) 25 MG 24 hr tablet Take 1 tablet (25 mg total) by mouth daily. Dx: I10   pravastatin (PRAVACHOL) 20 MG tablet Take 1 tablet (20 mg total) by mouth every evening. Dx: E78.2   predniSONE (DELTASONE) 50 MG tablet Take 1 tablet (50 mg total) by mouth daily with breakfast.   [DISCONTINUED] amLODipine (NORVASC) 10 MG  tablet Take 1 tablet (10 mg total) by mouth daily. Dx: I10   [DISCONTINUED] hydrochlorothiazide (HYDRODIURIL) 12.5 MG tablet Take 1 tablet (12.5 mg total) by mouth daily.   [DISCONTINUED] HYDROcodone-acetaminophen (NORCO) 7.5-325 MG tablet Take 1 tablet by mouth every 6 (six) hours as needed.   [DISCONTINUED] metoprolol succinate (TOPROL-XL) 25 MG 24 hr tablet Take 1 tablet (25 mg total) by mouth daily. Dx: I10   [DISCONTINUED] pravastatin (PRAVACHOL) 20 MG tablet Take 1 tablet (20 mg total) by mouth every evening. Dx: E78.2   No facility-administered encounter medications on file as of 04/30/2022.    Allergies (verified) Patient has no known allergies.   History: Past Medical History:  Diagnosis Date   Allergy    Back pain    herniated disc   Basal cell carcinoma of face 11/2014   LEFT Temple   Cataracts, bilateral    bilateral removed   Colon polyps    Cystocele    Essential hypertension, benign    takes Amlodipine and Prinizide daily   GERD (gastroesophageal reflux disease)    takes Nexium daily   Glaucoma    pt denies   Hyperlipidemia    takes Pravastatin daily   Impaired hearing    Incontinence    occasionally   Iron deficiency anemia due to chronic blood loss 02/16/2016   Due to chronic  cameron ulcers. See GI notes. Continue PPI and iron supplementation.   Low bone mass 02/2008   Lymphocytic colitis    Osteoarthritis    hip, knee; severe   Osteoporosis    Postoperative anemia due to acute blood loss 09/26/2011   Type II or unspecified type diabetes mellitus without mention of complication, not stated as uncontrolled 08/2010   Diet controlled   Past Surgical History:  Procedure Laterality Date   BASAL CELL CARCINOMA EXCISION Left 12/14/2014   LEFT temple   cataract surgery 04/25/13; 05/16/2013     COLONOSCOPY     JOINT REPLACEMENT N/A    Phreesia 07/29/2020   TOE SURGERY     bilateral   TONSILLECTOMY AND ADENOIDECTOMY     TOTAL HIP ARTHROPLASTY  09/24/2011    Procedure: TOTAL HIP ARTHROPLASTY;  Surgeon: Ninetta Lights, MD;  Location: [facility];  Service: Orthopedics;  Laterality: Right;   Family History  Problem Relation Age of Onset   Stroke Mother 70   Hypertension Mother    Heart disease Father 47       Endocarditis   Rheumatic fever Father    Obesity Daughter        unable to walk very far   Arthritis Daughter    Anesthesia problems Neg Hx    Hypotension Neg Hx    Pseudochol deficiency Neg Hx    Malignant hyperthermia Neg Hx    Colon cancer Neg Hx    Esophageal cancer Neg Hx    Pancreatic cancer Neg Hx    Rectal cancer Neg Hx    Stomach cancer Neg Hx    Social History   Socioeconomic History   Marital status: Widowed    Spouse name: n/a   Number of children: 2   Years of education: 11th grade   Highest education level: Not on file  Occupational History   Occupation: Retired Glass blower/designer  Tobacco Use   Smoking status: Former    Types: Cigarettes    Quit date: 07/07/1995    Years since quitting: 26.8   Smokeless tobacco: Never  Vaping Use   Vaping Use: Never used  Substance and Sexual Activity   Alcohol use: No   Drug use: No   Sexual activity: Never  Other Topics Concern   Not on file  Social History Narrative   Widowed - retired Hospital doctor   Daughter (a retired Forensic psychologist) lives with patient - she has medical problems related to obesity.   Her son and his family live across the street.   2 caffeine drinks/day      Social Determinants of Radio broadcast assistant Strain: Not on file  Food Insecurity: Not on file  Transportation Needs: Not on file  Physical Activity: Not on file  Stress: Not on file  Social Connections: Not on file    Tobacco Counseling Counseling given: Not Answered   Clinical Intake:  Pre-visit preparation completed: No  Pain : No/denies pain     Nutritional Status: BMI <19  Underweight Diabetes: Yes CBG done?: No Did pt. bring in CBG monitor from home?:  No     Diabetic? yes  Interpreter Needed?: No      Activities of Daily Living     No data to display          Patient Care Team: Dorna Mai, MD as PCP - General (Family Medicine) Ninetta Lights, MD (Inactive) as Consulting Physician (Orthopedic Surgery) Clent Jacks, MD as Consulting Physician (  Ophthalmology)  Indicate any recent Medical Services you may have received from other than [facility] providers in the past year (date may be approximate).     Assessment:   This is a routine wellness examination for [place].  Hearing/Vision screen No results found.  Dietary issues and exercise activities discussed:     Goals Addressed   None   Depression Screen    04/30/2022   11:39 AM 04/30/2021   10:02 AM 04/01/2021    1:44 PM 01/28/2021    9:39 AM 10/26/2020   10:32 AM 08/01/2020    9:59 AM 10/05/2019    8:39 AM  PHQ 2/9 Scores  PHQ - 2 Score 0 0 0 0 0 0 0  PHQ- 9 Score 0 0 0  0      Fall Risk    10/28/2021   10:33 AM 10/26/2020   10:31 AM 08/01/2020    9:59 AM 10/05/2019    8:40 AM 08/10/2018   10:12 AM  Fall Risk   Falls in the past year? 0 0 0 0 0  Number falls in past yr: 0 0 0 0   Injury with Fall? 0 0 0 0   Risk for fall due to :  No Fall Risks  Impaired balance/gait   Follow up  Falls evaluation completed Falls evaluation completed      FALL RISK PREVENTION PERTAINING TO THE HOME:  Any stairs in or around the home? No  If so, are there any without handrails? No  Home free of loose throw rugs in walkways, pet beds, electrical cords, etc? No  Adequate lighting in your home to reduce risk of falls? Yes   ASSISTIVE DEVICES UTILIZED TO PREVENT FALLS:  Life alert? No  Use of a cane, walker or w/c? Yes  Grab bars in the bathroom? Yes  Shower chair or bench in shower? Yes  Elevated toilet seat or a handicapped toilet? Yes   TIMED UP AND GO:  Was the test performed? Yes .  Length of time to ambulate 10 feet:   sec.   Gait steady and fast with assistive  device  Cognitive Function:        Immunizations Immunization History  Administered Date(s) Administered   Fluad Quad(high Dose 65+) 05/16/2020, 04/30/2022   Influenza Split 03/25/2011   Influenza, High Dose Seasonal PF 03/10/2018   Influenza, Seasonal, Injecte, Preservative Fre 09/07/2012   Influenza,inj,Quad PF,6+ Mos 03/22/2013, 03/28/2014, 05/22/2015, 02/26/2016, 03/20/2017, 03/29/2019, 04/30/2021   PFIZER(Purple Top)SARS-COV-2 Vaccination 09/22/2019, 10/13/2019, 05/16/2020   Pneumococcal Conjugate-13 03/28/2014   Pneumococcal Polysaccharide-23 02/24/2008   Td 03/30/2004, 07/18/2014   Zoster, Live 02/28/2008    TDAP status: Up to date  Flu Vaccine status: Completed at today's visit  Pneumococcal vaccine status: Up to date  Covid-19 vaccine status: Completed vaccines  Qualifies for Shingles Vaccine? Yes   Zostavax completed No   Shingrix Completed?: No.    Education has been provided regarding the importance of this vaccine. Patient has been advised to call insurance company to determine out of pocket expense if they have not yet received this vaccine. Advised may also receive vaccine at local pharmacy or Health Dept. Verbalized acceptance and understanding.  Screening Tests Health Maintenance  Topic Date Due   OPHTHALMOLOGY EXAM  06/29/2022 (Originally 05/31/2019)   COVID-19 Vaccine (4 - Pfizer risk series) 06/29/2022 (Originally 07/11/2020)   FOOT EXAM  08/28/2022 (Originally 03/28/2020)   HEMOGLOBIN A1C  10/29/2022   Medicare Annual Wellness (AWV)  05/01/2023   TETANUS/TDAP  07/18/2024   Pneumonia Vaccine 34+ Years old  Completed   INFLUENZA VACCINE  Completed   DEXA SCAN  Completed   HPV VACCINES  Aged Out   Zoster Vaccines- Shingrix  Discontinued    Health Maintenance  There are no preventive care reminders to display for this patient.   Colorectal cancer screening: No longer required.  Per patient  Mammogram status: No longer required due to 1 year.per  patient  Bone Density status: Completed  . Results reflect: Bone density results: OSTEOPENIA. Repeat every 2 years.  Lung Cancer Screening: (Low Dose CT Chest recommended if Age 50-80 years, 30 pack-year currently smoking OR have quit w/in 15years.) does qualify.   Lung Cancer Screening Referral: n/a  Additional Screening:  Hepatitis C Screening: does qualify; Completed n/a  Vision Screening: Recommended annual ophthalmology exams for early detection of glaucoma and other disorders of the eye. Is the patient up to date with their annual eye exam?  no Who is the provider or what is the name of the office in which the patient attends annual eye exams? N/a If pt is not established with a provider, would they like to be referred to a provider to establish care? No .   Dental Screening: Recommended annual dental exams for proper oral hygiene  Community Resource Referral / Chronic Care Management: CRR required this visit?  No   CCM required this visit?  No      Plan:     I have personally reviewed and noted the following in the patient's chart:   Medical and social history Use of alcohol, tobacco or illicit drugs  Current medications and supplements including opioid prescriptions. Patient is currently taking opioid prescriptions. Information provided to patient regarding non-opioid alternatives. Patient advised to discuss non-opioid treatment plan with their provider. Functional ability and status Nutritional status Physical activity Advanced directives List of other physicians Hospitalizations, surgeries, and ER visits in previous 12 months Vitals Screenings to include cognitive, depression, and falls Referrals and appointments  In addition, I have reviewed and discussed with patient certain preventive protocols, quality metrics, and best practice recommendations. A written personalized care plan for preventive services as well as general preventive health recommendations were  provided to patient.     Melene Plan, RMA   04/30/2022

## 2022-05-01 ENCOUNTER — Encounter: Payer: Self-pay | Admitting: Family Medicine

## 2022-05-01 LAB — BASIC METABOLIC PANEL WITH GFR
BUN/Creatinine Ratio: 22 (ref 12–28)
BUN: 25 mg/dL (ref 8–27)
CO2: 22 mmol/L (ref 20–29)
Calcium: 10.3 mg/dL (ref 8.7–10.3)
Chloride: 103 mmol/L (ref 96–106)
Creatinine, Ser: 1.14 mg/dL — ABNORMAL HIGH (ref 0.57–1.00)
Glucose: 105 mg/dL — ABNORMAL HIGH (ref 70–99)
Potassium: 4.7 mmol/L (ref 3.5–5.2)
Sodium: 141 mmol/L (ref 134–144)
eGFR: 46 mL/min/1.73 — ABNORMAL LOW

## 2022-05-01 NOTE — Progress Notes (Signed)
Established Patient Office Visit  Subjective    Patient ID: Jessica [name], female    DOB: 04-23-1930  Age: 88 y.o. MRN: [id number]  CC:  Chief Complaint  Patient presents with   Follow-up    6 month    HPI Jessica [name] presents for routine follow up of chronic med issues.    Outpatient Encounter Medications as of 04/30/2022  Medication Sig   amLODipine (NORVASC) 10 MG tablet Take 1 tablet (10 mg total) by mouth daily. Dx: I10   Biotin 1 MG CAPS Take by mouth.   cycloSPORINE (RESTASIS) 0.05 % ophthalmic emulsion Place 1 drop into both eyes 2 (two) times daily.   esomeprazole (NEXIUM) 20 MG capsule Take 1 capsule (20 mg total) by mouth daily at 12 noon.   GLUCOSAMINE PO Take by mouth.   hydrochlorothiazide (HYDRODIURIL) 12.5 MG tablet Take 1 tablet (12.5 mg total) by mouth daily.   HYDROcodone-acetaminophen (NORCO) 7.5-325 MG tablet Take 1 tablet by mouth every 6 (six) hours as needed.   metoprolol succinate (TOPROL-XL) 25 MG 24 hr tablet Take 1 tablet (25 mg total) by mouth daily. Dx: I10   pravastatin (PRAVACHOL) 20 MG tablet Take 1 tablet (20 mg total) by mouth every evening. Dx: E78.2   predniSONE (DELTASONE) 50 MG tablet Take 1 tablet (50 mg total) by mouth daily with breakfast.   [DISCONTINUED] amLODipine (NORVASC) 10 MG tablet Take 1 tablet (10 mg total) by mouth daily. Dx: I10   [DISCONTINUED] hydrochlorothiazide (HYDRODIURIL) 12.5 MG tablet Take 1 tablet (12.5 mg total) by mouth daily.   [DISCONTINUED] HYDROcodone-acetaminophen (NORCO) 7.5-325 MG tablet Take 1 tablet by mouth every 6 (six) hours as needed.   [DISCONTINUED] metoprolol succinate (TOPROL-XL) 25 MG 24 hr tablet Take 1 tablet (25 mg total) by mouth daily. Dx: I10   [DISCONTINUED] pravastatin (PRAVACHOL) 20 MG tablet Take 1 tablet (20 mg total) by mouth every evening. Dx: E78.2   No facility-administered encounter medications on file as of 04/30/2022.    Past Medical History:  Diagnosis Date   Allergy     Back pain    herniated disc   Basal cell carcinoma of face 11/2014   LEFT Temple   Cataracts, bilateral    bilateral removed   Colon polyps    Cystocele    Essential hypertension, benign    takes Amlodipine and Prinizide daily   GERD (gastroesophageal reflux disease)    takes Nexium daily   Glaucoma    pt denies   Hyperlipidemia    takes Pravastatin daily   Impaired hearing    Incontinence    occasionally   Iron deficiency anemia due to chronic blood loss 02/16/2016   Due to chronic cameron ulcers. See GI notes. Continue PPI and iron supplementation.   Low bone mass 02/2008   Lymphocytic colitis    Osteoarthritis    hip, knee; severe   Osteoporosis    Postoperative anemia due to acute blood loss 09/26/2011   Type II or unspecified type diabetes mellitus without mention of complication, not stated as uncontrolled 08/2010   Diet controlled    Past Surgical History:  Procedure Laterality Date   BASAL CELL CARCINOMA EXCISION Left 12/14/2014   LEFT temple   cataract surgery 04/25/13; 05/16/2013     COLONOSCOPY     JOINT REPLACEMENT N/A    [employer] 07/29/2020   TOE SURGERY     bilateral   TONSILLECTOMY AND ADENOIDECTOMY     TOTAL HIP ARTHROPLASTY  09/24/2011  Procedure: TOTAL HIP ARTHROPLASTY;  Surgeon: Ninetta Lights, MD;  Location: [facility];  Service: Orthopedics;  Laterality: Right;    Family History  Problem Relation Age of Onset   Stroke Mother 22   Hypertension Mother    Heart disease Father 44       Endocarditis   Rheumatic fever Father    Obesity Daughter        unable to walk very far   Arthritis Daughter    Anesthesia problems Neg Hx    Hypotension Neg Hx    Pseudochol deficiency Neg Hx    Malignant hyperthermia Neg Hx    Colon cancer Neg Hx    Esophageal cancer Neg Hx    Pancreatic cancer Neg Hx    Rectal cancer Neg Hx    Stomach cancer Neg Hx     Social History   Socioeconomic History   Marital status: Widowed    Spouse name: n/a   Number of  children: 2   Years of education: 11th grade   Highest education level: Not on file  Occupational History   Occupation: Retired Glass blower/designer  Tobacco Use   Smoking status: Former    Types: Cigarettes    Quit date: 07/07/1995    Years since quitting: 26.8   Smokeless tobacco: Never  Vaping Use   Vaping Use: Never used  Substance and Sexual Activity   Alcohol use: No   Drug use: No   Sexual activity: Never  Other Topics Concern   Not on file  Social History Narrative   Widowed - retired Hospital doctor   Daughter (a retired Forensic psychologist) lives with patient - she has medical problems related to obesity.   Her son and his family live across the street.   2 caffeine drinks/day      Social Determinants of Health   Financial Resource Strain: Not on file  Food Insecurity: Not on file  Transportation Needs: Not on file  Physical Activity: Not on file  Stress: Not on file  Social Connections: Not on file  Intimate Partner Violence: Not on file    Review of Systems  All other systems reviewed and are negative.       Objective    BP 137/78   Pulse 97   Temp 98.1 F (36.7 C) (Oral)   Resp 16   Ht '5\' 3"'$  (1.6 m)   Wt 168 lb 6.4 oz (76.4 kg)   BMI 29.83 kg/m   Physical Exam Vitals and nursing note reviewed.  Constitutional:      General: She is not in acute distress. Cardiovascular:     Rate and Rhythm: Normal rate and regular rhythm.  Pulmonary:     Effort: Pulmonary effort is normal.     Breath sounds: Normal breath sounds.  Abdominal:     Palpations: Abdomen is soft.     Tenderness: There is no abdominal tenderness.  Musculoskeletal:     Right knee: Decreased range of motion.     Left knee: Decreased range of motion.     Comments: Utilizing seated walker  Neurological:     General: No focal deficit present.     Mental Status: She is alert and oriented to person, place, and time.         Assessment & Plan:   1. Essential  hypertension Appears stable. Continue. Meds refilled.  - amLODipine (NORVASC) 10 MG tablet; Take 1 tablet (10 mg total) by mouth daily. Dx: [place]  Dispense:  90 tablet; Refill: 1 - metoprolol succinate (TOPROL-XL) 25 MG 24 hr tablet; Take 1 tablet (25 mg total) by mouth daily. Dx: I10  Dispense: 90 tablet; Refill: 1 - hydrochlorothiazide (HYDRODIURIL) 12.5 MG tablet; Take 1 tablet (12.5 mg total) by mouth daily.  Dispense: 90 tablet; Refill: 1 - Basic Metabolic Panel  2. Type 2 diabetes mellitus without complication, without long-term current use of insulin (HCC) A1c is at goal. Continue  - POCT glycosylated hemoglobin (Hb A1C)  3. Mixed hyperlipidemia Continue. Meds refilled.  - pravastatin (PRAVACHOL) 20 MG tablet; Take 1 tablet (20 mg total) by mouth every evening. Dx: A06.0  Dispense: 90 tablet; Refill: 1  4. Primary osteoarthritis of both knees Hydrocodone and prednisone prescribed.  - HYDROcodone-acetaminophen (NORCO) 7.5-325 MG tablet; Take 1 tablet by mouth every 6 (six) hours as needed.  Dispense: 30 tablet; Refill: 0  5. Encounter for Medicare annual wellness exam   6. Need for immunization against influenza  - Flu Vaccine QUAD High Dose(Fluad)    Return in 6 months (on 10/29/2022) for follow up.   Becky Sax, MD

## 2022-05-02 ENCOUNTER — Encounter: Payer: Self-pay | Admitting: Family Medicine

## 2022-07-24 ENCOUNTER — Encounter: Payer: Self-pay | Admitting: Family Medicine

## 2022-07-24 ENCOUNTER — Other Ambulatory Visit: Payer: Self-pay | Admitting: Family Medicine

## 2022-07-24 DIAGNOSIS — M170 Bilateral primary osteoarthritis of knee: Secondary | ICD-10-CM

## 2022-07-24 MED ORDER — HYDROCODONE-ACETAMINOPHEN 7.5-325 MG PO TABS: 1.0000 | ORAL_TABLET | Freq: Four times a day (QID) | ORAL | 0 refills | Status: DC | PRN

## 2022-09-15 ENCOUNTER — Telehealth: Payer: Self-pay | Admitting: *Deleted

## 2022-09-15 ENCOUNTER — Telehealth: Payer: Self-pay | Admitting: Family Medicine

## 2022-09-15 ENCOUNTER — Encounter: Payer: Self-pay | Admitting: *Deleted

## 2022-09-15 NOTE — Telephone Encounter (Signed)
Patient was called back and advise if she is not having any problems she can wait until her May appt. Patient agreed

## 2022-09-15 NOTE — Telephone Encounter (Signed)
Patient has been called.  Copied from [place] [phone]. Topic: General - Other >> Sep 15, 2022  9:55 AM Denman George T wrote: Reason for CRM: Patient called requesting labs for her kidney function this week between 10:30 and 11:30 / [facility] Medicare / [phone] / pec

## 2022-09-15 NOTE — Telephone Encounter (Signed)
Patient has been called.  Copied from [place] [phone]. Topic: General - Other >> Sep 15, 2022  9:55 AM Denman George T wrote: Reason for CRM: Patient called requesting labs for her kidney function this week between 10:30 and 11:30 / [facility] Medicare / [phone] / pec

## 2022-09-15 NOTE — Telephone Encounter (Signed)
Copied from [place] [phone]. Topic: General - Other >> Sep 15, 2022  9:55 AM Denman George T wrote: Reason for CRM: Patient called requesting labs for her kidney function this week between 10:30 and 11:30 / [facility] Medicare / [phone] / pec

## 2022-09-15 NOTE — Telephone Encounter (Signed)
Patient has been called .  Copied from [facility] [phone]. Topic: General - Other >> Sep 15, 2022  9:55 AM Denman George T wrote: Reason for CRM: Patient called requesting labs for her kidney function this week between 10:30 and 11:30 / [facility] Medicare / [phone] / pec

## 2022-09-15 NOTE — Telephone Encounter (Signed)
Pt is calling following up to ask can she get order or Kidney Function at her next office visit?  CB- [phone] / pec

## 2022-10-25 ENCOUNTER — Other Ambulatory Visit: Payer: Self-pay | Admitting: Family Medicine

## 2022-10-25 DIAGNOSIS — I10 Essential (primary) hypertension: Secondary | ICD-10-CM

## 2022-10-25 DIAGNOSIS — E782 Mixed hyperlipidemia: Secondary | ICD-10-CM

## 2022-10-27 NOTE — Telephone Encounter (Signed)
Requested Prescriptions  Pending Prescriptions Disp Refills   pravastatin (PRAVACHOL) 20 MG tablet [Pharmacy Med Name: PRAVASTATIN SODIUM 20 MG TAB] 90 tablet 0    Sig: TAKE 1 TABLET (20 MG TOTAL) BY MOUTH EVERY EVENING.     Cardiovascular:  Antilipid - Statins Failed - 10/25/2022  1:15 AM      Failed - Lipid Panel in normal range within the last 12 months    Cholesterol, Total  Date Value Ref Range Status  08/01/2020 133 100 - 199 mg/dL Final   LDL Chol Calc (NIH)  Date Value Ref Range Status  08/01/2020 64 0 - 99 mg/dL Final   HDL  Date Value Ref Range Status  08/01/2020 42 >39 mg/dL Final   Triglycerides  Date Value Ref Range Status  08/01/2020 158 (H) 0 - 149 mg/dL Final         Passed - Patient is not pregnant      Passed - Valid encounter within last 12 months    Recent Outpatient Visits           6 months ago Essential hypertension   [place] [facility], MD   12 months ago Essential hypertension   [place] [facility], MD   1 year ago Essential hypertension   [place] [facility], MD   1 year ago Acute cystitis with hematuria   [place] [facility] MD   1 year ago Dysuria   [place] [facility], Gildardo Pounds, NP       Future Appointments             In 2 days Georganna Skeans, MD [facility]             hydrochlorothiazide (HYDRODIURIL) 12.5 MG tablet [Pharmacy Med Name: HYDROCHLOROTHIAZIDE 12.5 MG TB] 90 tablet 0    Sig: TAKE 1 TABLET BY MOUTH EVERY DAY     Cardiovascular: Diuretics - Thiazide Failed - 10/25/2022  1:15 AM      Failed - Cr in normal range and within 180 days    Creatinine  Date Value Ref Range Status  12/23/2018 44.4 20.0 - 300.0 mg/dL Final   Creat  Date Value Ref Range Status  02/26/2016 0.91 (H) 0.60 - 0.88 mg/dL Final     Comment:      For patients > or = 87 years of age: The upper reference limit for Creatinine is approximately 13% higher for people identified as African-American.      Creatinine, Ser  Date Value Ref Range Status  04/30/2022 1.14 (H) 0.57 - 1.00 mg/dL Final         Passed - K in normal range and within 180 days    Potassium  Date Value Ref Range Status  04/30/2022 4.7 3.5 - 5.2 mmol/L Final         Passed - Na in normal range and within 180 days    Sodium  Date Value Ref Range Status  04/30/2022 141 134 - 144 mmol/L Final         Passed - Last BP in normal range    BP Readings from Last 1 Encounters:  04/30/22 137/78         Passed - Valid encounter within last 6 months    Recent Outpatient Visits  6 months ago Essential hypertension   [place] [facility], MD   12 months ago Essential hypertension   [place] [facility] at [facility], MD   1 year ago Essential hypertension   [place] [facility], MD   1 year ago Acute cystitis with hematuria   [place] [facility], MD   1 year ago Dysuria   [place] [facility] at [facility], Gildardo Pounds, NP       Future Appointments             In 2 days Georganna Skeans, MD [facility]             amLODipine (NORVASC) 10 MG tablet [Pharmacy Med Name: AMLODIPINE BESYLATE 10 MG TAB] 90 tablet 0    Sig: TAKE 1 TABLET (10 MG TOTAL) BY MOUTH DAILY. DX: I10     Cardiovascular: Calcium Channel Blockers 2 Passed - 10/25/2022  1:15 AM      Passed - Last BP in normal range    BP Readings from Last 1 Encounters:  04/30/22 137/78         Passed - Last Heart Rate in normal range    Pulse Readings from Last 1 Encounters:  04/30/22 97         Passed - Valid encounter within last 6 months    Recent Outpatient Visits           6 months ago  Essential hypertension   [place] [facility], MD   12 months ago Essential hypertension   [place] [facility], MD   1 year ago Essential hypertension   [place] [facility] at [facility], MD   1 year ago Acute cystitis with hematuria   [place] [facility] at [facility], MD   1 year ago Dysuria   [place] [facility], Gildardo Pounds, NP       Future Appointments             In 2 days Georganna Skeans, MD [facility] at [facility]             metoprolol succinate (TOPROL-XL) 25 MG 24 hr tablet [Pharmacy Med Name: METOPROLOL SUCC ER 25 MG TAB] 90 tablet 0    Sig: TAKE 1 TABLET (25 MG TOTAL) BY MOUTH DAILY. DX: I10     Cardiovascular:  Beta Blockers Passed - 10/25/2022  1:15 AM      Passed - Last BP in normal range    BP Readings from Last 1 Encounters:  04/30/22 137/78         Passed - Last Heart Rate in normal range    Pulse Readings from Last 1 Encounters:  04/30/22 97         Passed - Valid encounter within last 6 months    Recent Outpatient Visits           6 months ago Essential hypertension   [place] [facility], MD   12 months ago Essential hypertension   [place] [facility], MD   1 year ago Essential hypertension   [place] [facility] at [facility], MD  1 year ago Acute cystitis with hematuria    [facility] at [facility], MD   1 year ago Dysuria   [facility], Gildardo Pounds, NP       Future Appointments             In 2 days Georganna Skeans, MD [facility] at [facility]

## 2022-10-29 ENCOUNTER — Ambulatory Visit (INDEPENDENT_AMBULATORY_CARE_PROVIDER_SITE_OTHER): Payer: Medicare Other | Admitting: Family Medicine

## 2022-10-29 VITALS — BP 147/69 | HR 91 | Temp 98.1°F | Resp 16 | Ht 63.0 in | Wt 165.6 lb

## 2022-10-29 DIAGNOSIS — E782 Mixed hyperlipidemia: Secondary | ICD-10-CM

## 2022-10-29 DIAGNOSIS — E119 Type 2 diabetes mellitus without complications: Secondary | ICD-10-CM

## 2022-10-29 DIAGNOSIS — I10 Essential (primary) hypertension: Secondary | ICD-10-CM

## 2022-10-29 DIAGNOSIS — Z7984 Long term (current) use of oral hypoglycemic drugs: Secondary | ICD-10-CM | POA: Diagnosis not present

## 2022-10-29 DIAGNOSIS — M170 Bilateral primary osteoarthritis of knee: Secondary | ICD-10-CM

## 2022-10-29 DIAGNOSIS — M65331 Trigger finger, right middle finger: Secondary | ICD-10-CM | POA: Diagnosis not present

## 2022-10-29 DIAGNOSIS — H903 Sensorineural hearing loss, bilateral: Secondary | ICD-10-CM | POA: Diagnosis not present

## 2022-10-29 LAB — POCT GLYCOSYLATED HEMOGLOBIN (HGB A1C): Hemoglobin A1C: 5.8 % — AB (ref 4.0–5.6)

## 2022-10-29 MED ORDER — HYDROCODONE-ACETAMINOPHEN 7.5-325 MG PO TABS
1.0000 | ORAL_TABLET | Freq: Four times a day (QID) | ORAL | 0 refills | Status: DC | PRN
Start: 2022-10-29 — End: 2023-01-21

## 2022-10-29 NOTE — Progress Notes (Signed)
-  patient has numbness around ankle at night -pt here forCPE

## 2022-10-29 NOTE — Progress Notes (Signed)
Established Patient Office Visit  Subjective    Patient ID: Jessica [name], female    DOB: April 09, 1930  Age: 89 y.o. MRN: [id number]  CC:  Chief Complaint  Patient presents with   Annual Exam    HPI Jessica [name] presents for follow up of chronic med issues. Patient denies acute complaints or concerns.    Outpatient Encounter Medications as of 10/29/2022  Medication Sig   amLODipine (NORVASC) 10 MG tablet TAKE 1 TABLET (10 MG TOTAL) BY MOUTH DAILY. DX: I10   Biotin 1 MG CAPS Take by mouth.   esomeprazole (NEXIUM) 20 MG capsule Take 1 capsule (20 mg total) by mouth daily at 12 noon.   GLUCOSAMINE PO Take by mouth.   hydrochlorothiazide (HYDRODIURIL) 12.5 MG tablet TAKE 1 TABLET BY MOUTH EVERY DAY   HYDROcodone-acetaminophen (NORCO) 7.5-325 MG tablet Take 1 tablet by mouth every 6 (six) hours as needed.   metoprolol succinate (TOPROL-XL) 25 MG 24 hr tablet TAKE 1 TABLET (25 MG TOTAL) BY MOUTH DAILY. DX: I10   pravastatin (PRAVACHOL) 20 MG tablet TAKE 1 TABLET (20 MG TOTAL) BY MOUTH EVERY EVENING.   predniSONE (DELTASONE) 50 MG tablet Take 1 tablet (50 mg total) by mouth daily with breakfast.   [DISCONTINUED] cycloSPORINE (RESTASIS) 0.05 % ophthalmic emulsion Place 1 drop into both eyes 2 (two) times daily.   [DISCONTINUED] HYDROcodone-acetaminophen (NORCO) 7.5-325 MG tablet Take 1 tablet by mouth every 6 (six) hours as needed.   No facility-administered encounter medications on file as of 10/29/2022.    Past Medical History:  Diagnosis Date   Allergy    Back pain    herniated disc   Basal cell carcinoma of face 11/2014   LEFT Temple   Cataracts, bilateral    bilateral removed   Colon polyps    Cystocele    Essential hypertension, benign    takes Amlodipine and Prinizide daily   GERD (gastroesophageal reflux disease)    takes Nexium daily   Glaucoma    pt denies   Hyperlipidemia    takes Pravastatin daily   Impaired hearing    Incontinence    occasionally   Iron  deficiency anemia due to chronic blood loss 02/16/2016   Due to chronic cameron ulcers. See GI notes. Continue PPI and iron supplementation.   Low bone mass 02/2008   Lymphocytic colitis    Osteoarthritis    hip, knee; severe   Osteoporosis    Postoperative anemia due to acute blood loss 09/26/2011   Type II or unspecified type diabetes mellitus without mention of complication, not stated as uncontrolled 08/2010   Diet controlled    Past Surgical History:  Procedure Laterality Date   BASAL CELL CARCINOMA EXCISION Left 12/14/2014   LEFT temple   cataract surgery 04/25/13; 05/16/2013     COLONOSCOPY     JOINT REPLACEMENT N/A    Phreesia 07/29/2020   TOE SURGERY     bilateral   TONSILLECTOMY AND ADENOIDECTOMY     TOTAL HIP ARTHROPLASTY  09/24/2011   Procedure: TOTAL HIP ARTHROPLASTY;  Surgeon: Loreta Ave, MD;  Location: [facility];  Service: Orthopedics;  Laterality: Right;    Family History  Problem Relation Age of Onset   Stroke Mother 76   Hypertension Mother    Heart disease Father 41       Endocarditis   Rheumatic fever Father    Obesity Daughter        unable to walk very far  Arthritis Daughter    Anesthesia problems Neg Hx    Hypotension Neg Hx    Pseudochol deficiency Neg Hx    Malignant hyperthermia Neg Hx    Colon cancer Neg Hx    Esophageal cancer Neg Hx    Pancreatic cancer Neg Hx    Rectal cancer Neg Hx    Stomach cancer Neg Hx     Social History   Socioeconomic History   Marital status: Widowed    Spouse name: n/a   Number of children: 2   Years of education: 11th grade   Highest education level: 11th grade  Occupational History   Occupation: Retired Print production planner  Tobacco Use   Smoking status: Former    Types: Cigarettes    Quit date: 07/07/1995    Years since quitting: 27.3   Smokeless tobacco: Never  Vaping Use   Vaping Use: Never used  Substance and Sexual Activity   Alcohol use: No   Drug use: No   Sexual activity: Never  Other Topics  Concern   Not on file  Social History Narrative   Widowed - retired Chief of Staff   Daughter (a retired Pensions consultant) lives with patient - she has medical problems related to obesity.   Her son and his family live across the street.   2 caffeine drinks/day      Social Determinants of Health   Financial Resource Strain: Low Risk  (10/25/2022)   Overall Financial Resource Strain (CARDIA)    Difficulty of Paying Living Expenses: Not very hard  Food Insecurity: No Food Insecurity (10/25/2022)   Hunger Vital Sign    Worried About Running Out of Food in the Last Year: Never true    Ran Out of Food in the Last Year: Never true  Transportation Needs: No Transportation Needs (10/25/2022)   PRAPARE - Administrator, Civil Service (Medical): No    Lack of Transportation (Non-Medical): No  Physical Activity: Unknown (10/25/2022)   Exercise Vital Sign    Days of Exercise per Week: 0 days    Minutes of Exercise per Session: Not on file  Stress: No Stress Concern Present (10/25/2022)   [employer] of Occupational Health - Occupational Stress Questionnaire    Feeling of Stress : Not at all  Social Connections: Socially Isolated (10/25/2022)   Social Connection and Isolation Panel [NHANES]    Frequency of Communication with Friends and Family: Never    Frequency of Social Gatherings with Friends and Family: Twice a week    Attends Religious Services: Never    Database administrator or Organizations: No    Attends Engineer, structural: Not on file    Marital Status: Widowed  Intimate Partner Violence: Not on file    Review of Systems  All other systems reviewed and are negative.       Objective    BP (!) 147/69   Pulse 91   Temp 98.1 F (36.7 C) (Oral)   Resp 16   Ht 5\' 3"  (1.6 m)   Wt 165 lb 9.6 oz (75.1 kg)   SpO2 94%   BMI 29.33 kg/m   Physical Exam Vitals and nursing note reviewed.  Constitutional:      General: She is not in acute  distress. Cardiovascular:     Rate and Rhythm: Normal rate and regular rhythm.  Pulmonary:     Effort: Pulmonary effort is normal.     Breath sounds: Normal breath sounds.  Abdominal:  Palpations: Abdomen is soft.     Tenderness: There is no abdominal tenderness.  Musculoskeletal:     Right knee: Decreased range of motion.     Left knee: Decreased range of motion.     Comments: Utilizing seated walker  Neurological:     General: No focal deficit present.     Mental Status: She is alert and oriented to person, place, and time.         Assessment & Plan:   1. Type 2 diabetes mellitus without complication, without long-term current use of insulin (HCC) Slightly increased A1c but at goal. continue - HM DIABETES FOOT EXAM - POCT glycosylated hemoglobin (Hb A1C) - Lipid Panel - CMP14+EGFR  2. Essential hypertension Appears stable. Continue.  - Lipid Panel - CMP14+EGFR  3. Mixed hyperlipidemia Monitoring labs ordered - Lipid Panel - CMP14+EGFR  4. Sensorineural hearing loss (SNHL) of both ears   5. Trigger middle finger of right hand Patient defers further eval/mgt at this time  6. Primary osteoarthritis of both knees Patient defers further eval at this time - HYDROcodone-acetaminophen (NORCO) 7.5-325 MG tablet; Take 1 tablet by mouth every 6 (six) hours as needed.  Dispense: 30 tablet; Refill: 0    Return in about 4 months (around 03/01/2023) for follow up.   Tommie Raymond, MD

## 2022-10-30 LAB — CMP14+EGFR
ALT: 15 IU/L (ref 0–32)
AST: 21 IU/L (ref 0–40)
Albumin/Globulin Ratio: 1.6 (ref 1.2–2.2)
Albumin: 5 g/dL — ABNORMAL HIGH (ref 3.6–4.6)
Alkaline Phosphatase: 88 IU/L (ref 44–121)
BUN/Creatinine Ratio: 33 — ABNORMAL HIGH (ref 12–28)
BUN: 40 mg/dL — ABNORMAL HIGH (ref 10–36)
Bilirubin Total: 0.4 mg/dL (ref 0.0–1.2)
CO2: 18 mmol/L — ABNORMAL LOW (ref 20–29)
Calcium: 10.2 mg/dL (ref 8.7–10.3)
Chloride: 101 mmol/L (ref 96–106)
Creatinine, Ser: 1.23 mg/dL — ABNORMAL HIGH (ref 0.57–1.00)
Globulin, Total: 3.2 g/dL (ref 1.5–4.5)
Glucose: 117 mg/dL — ABNORMAL HIGH (ref 70–99)
Potassium: 4.3 mmol/L (ref 3.5–5.2)
Sodium: 140 mmol/L (ref 134–144)
Total Protein: 8.2 g/dL (ref 6.0–8.5)
eGFR: 42 mL/min/{1.73_m2} — ABNORMAL LOW (ref >59)

## 2022-10-30 LAB — LIPID PANEL
Chol/HDL Ratio: 3.5 ratio (ref 0.0–4.4)
Cholesterol, Total: 164 mg/dL (ref 100–199)
HDL: 47 mg/dL (ref >39)
LDL Chol Calc (NIH): 93 mg/dL (ref 0–99)
Triglycerides: 139 mg/dL (ref 0–149)
VLDL Cholesterol Cal: 24 mg/dL (ref 5–40)

## 2022-10-31 ENCOUNTER — Encounter: Payer: Self-pay | Admitting: Family Medicine

## 2022-12-17 DIAGNOSIS — H353221 Exudative age-related macular degeneration, left eye, with active choroidal neovascularization: Secondary | ICD-10-CM | POA: Diagnosis not present

## 2023-01-20 ENCOUNTER — Other Ambulatory Visit: Payer: Self-pay | Admitting: Family Medicine

## 2023-01-20 DIAGNOSIS — I10 Essential (primary) hypertension: Secondary | ICD-10-CM

## 2023-01-20 DIAGNOSIS — E782 Mixed hyperlipidemia: Secondary | ICD-10-CM

## 2023-01-20 NOTE — Telephone Encounter (Signed)
Patient request for medication

## 2023-01-21 ENCOUNTER — Other Ambulatory Visit: Payer: Self-pay | Admitting: Family Medicine

## 2023-01-21 DIAGNOSIS — M170 Bilateral primary osteoarthritis of knee: Secondary | ICD-10-CM

## 2023-01-21 MED ORDER — HYDROCODONE-ACETAMINOPHEN 7.5-325 MG PO TABS
1.0000 | ORAL_TABLET | Freq: Four times a day (QID) | ORAL | 0 refills | Status: DC | PRN
Start: 2023-01-21 — End: 2023-03-03

## 2023-03-03 ENCOUNTER — Ambulatory Visit (INDEPENDENT_AMBULATORY_CARE_PROVIDER_SITE_OTHER): Payer: Medicare Other | Admitting: Family Medicine

## 2023-03-03 ENCOUNTER — Encounter: Payer: Self-pay | Admitting: Family Medicine

## 2023-03-03 VITALS — BP 155/72 | HR 75 | Temp 98.1°F | Resp 16 | Wt 164.4 lb

## 2023-03-03 DIAGNOSIS — E782 Mixed hyperlipidemia: Secondary | ICD-10-CM

## 2023-03-03 DIAGNOSIS — E119 Type 2 diabetes mellitus without complications: Secondary | ICD-10-CM

## 2023-03-03 DIAGNOSIS — N1832 Chronic kidney disease, stage 3b: Secondary | ICD-10-CM | POA: Diagnosis not present

## 2023-03-03 DIAGNOSIS — Z23 Encounter for immunization: Secondary | ICD-10-CM

## 2023-03-03 DIAGNOSIS — I10 Essential (primary) hypertension: Secondary | ICD-10-CM | POA: Diagnosis not present

## 2023-03-03 DIAGNOSIS — N183 Chronic kidney disease, stage 3 unspecified: Secondary | ICD-10-CM | POA: Diagnosis not present

## 2023-03-03 DIAGNOSIS — Z7984 Long term (current) use of oral hypoglycemic drugs: Secondary | ICD-10-CM

## 2023-03-03 DIAGNOSIS — E1122 Type 2 diabetes mellitus with diabetic chronic kidney disease: Secondary | ICD-10-CM

## 2023-03-03 DIAGNOSIS — M170 Bilateral primary osteoarthritis of knee: Secondary | ICD-10-CM | POA: Diagnosis not present

## 2023-03-03 MED ORDER — HYDROCODONE-ACETAMINOPHEN 7.5-325 MG PO TABS
1.0000 | ORAL_TABLET | Freq: Four times a day (QID) | ORAL | 0 refills | Status: DC | PRN
Start: 2023-03-03 — End: 2023-04-21

## 2023-03-03 NOTE — Progress Notes (Signed)
Established Patient Office Visit  Subjective    Patient ID: Jessica [name], female    DOB: [birth year]  Age: 89 y.o. MRN: [id number]  CC:  Chief Complaint  Patient presents with   Follow-up    HPI Jessica [name] presents for follow up of chronic med issues. Patient denies acute complaints or concerns.    Outpatient Encounter Medications as of 03/03/2023  Medication Sig   amLODipine (NORVASC) 10 MG tablet TAKE 1 TABLET (10 MG TOTAL) BY MOUTH DAILY. DX: I10   esomeprazole (NEXIUM) 20 MG capsule Take 1 capsule (20 mg total) by mouth daily at 12 noon.   GLUCOSAMINE PO Take by mouth.   hydrochlorothiazide (HYDRODIURIL) 12.5 MG tablet TAKE 1 TABLET BY MOUTH EVERY DAY   metoprolol succinate (TOPROL-XL) 25 MG 24 hr tablet TAKE 1 TABLET (25 MG TOTAL) BY MOUTH DAILY. DX: I10   pravastatin (PRAVACHOL) 20 MG tablet TAKE 1 TABLET BY MOUTH EVERY DAY IN THE EVENING   predniSONE (DELTASONE) 50 MG tablet Take 1 tablet (50 mg total) by mouth daily with breakfast.   [DISCONTINUED] HYDROcodone-acetaminophen (NORCO) 7.5-325 MG tablet Take 1 tablet by mouth every 6 (six) hours as needed.   HYDROcodone-acetaminophen (NORCO) 7.5-325 MG tablet Take 1 tablet by mouth every 6 (six) hours as needed.   [DISCONTINUED] Biotin 1 MG CAPS Take by mouth.   No facility-administered encounter medications on file as of 03/03/2023.    Past Medical History:  Diagnosis Date   Allergy    Back pain    herniated disc   Basal cell carcinoma of face 11/2014   LEFT Temple   Cataracts, bilateral    bilateral removed   Colon polyps    Cystocele    Essential hypertension, benign    takes Amlodipine and Prinizide daily   GERD (gastroesophageal reflux disease)    takes Nexium daily   Glaucoma    pt denies   Hyperlipidemia    takes Pravastatin daily   Impaired hearing    Incontinence    occasionally   Iron deficiency anemia due to chronic blood loss 02/16/2016   Due to chronic cameron ulcers. See GI notes.  Continue PPI and iron supplementation.   Low bone mass 02/2008   Lymphocytic colitis    Osteoarthritis    hip, knee; severe   Osteoporosis    Postoperative anemia due to acute blood loss 09/26/2011   Type II or unspecified type diabetes mellitus without mention of complication, not stated as uncontrolled 08/2010   Diet controlled    Past Surgical History:  Procedure Laterality Date   BASAL CELL CARCINOMA EXCISION Left 12/14/2014   LEFT temple   cataract surgery 04/25/13; 05/16/2013     COLONOSCOPY     JOINT REPLACEMENT N/A    Phreesia 07/29/2020   TOE SURGERY     bilateral   TONSILLECTOMY AND ADENOIDECTOMY     TOTAL HIP ARTHROPLASTY  09/24/2011   Procedure: TOTAL HIP ARTHROPLASTY;  Surgeon: Loreta Ave, MD;  Location: [facility] OR;  Service: Orthopedics;  Laterality: Right;    Family History  Problem Relation Age of Onset   Stroke Mother 2   Hypertension Mother    Heart disease Father 75       Endocarditis   Rheumatic fever Father    Obesity Daughter        unable to walk very far   Arthritis Daughter    Anesthesia problems Neg Hx    Hypotension Neg Hx    Pseudochol  deficiency Neg Hx    Malignant hyperthermia Neg Hx    Colon cancer Neg Hx    Esophageal cancer Neg Hx    Pancreatic cancer Neg Hx    Rectal cancer Neg Hx    Stomach cancer Neg Hx     Social History   Socioeconomic History   Marital status: Widowed    Spouse name: n/a   Number of children: 2   Years of education: 11th grade   Highest education level: 11th grade  Occupational History   Occupation: Retired Print production planner  Tobacco Use   Smoking status: Former    Current packs/day: 0.00    Types: Cigarettes    Quit date: 07/07/1995    Years since quitting: 27.6   Smokeless tobacco: Never  Vaping Use   Vaping status: Never Used  Substance and Sexual Activity   Alcohol use: No   Drug use: No   Sexual activity: Never  Other Topics Concern   Not on file  Social History Narrative   Widowed - retired  Chief of Staff   Daughter (a retired Pensions consultant) lives with patient - she has medical problems related to obesity.   Her son and his family live across the street.   2 caffeine drinks/day      Social Determinants of Health   Financial Resource Strain: Low Risk  (10/25/2022)   Overall Financial Resource Strain (CARDIA)    Difficulty of Paying Living Expenses: Not very hard  Food Insecurity: No Food Insecurity (10/25/2022)   Hunger Vital Sign    Worried About Running Out of Food in the Last Year: Never true    Ran Out of Food in the Last Year: Never true  Transportation Needs: No Transportation Needs (10/25/2022)   PRAPARE - Administrator, Civil Service (Medical): No    Lack of Transportation (Non-Medical): No  Physical Activity: Unknown (10/25/2022)   Exercise Vital Sign    Days of Exercise per Week: 0 days    Minutes of Exercise per Session: Not on file  Stress: No Stress Concern Present (10/25/2022)   [employer] of Occupational Health - Occupational Stress Questionnaire    Feeling of Stress : Not at all  Social Connections: Socially Isolated (10/25/2022)   Social Connection and Isolation Panel [NHANES]    Frequency of Communication with Friends and Family: Never    Frequency of Social Gatherings with Friends and Family: Twice a week    Attends Religious Services: Never    Database administrator or Organizations: No    Attends Engineer, structural: Not on file    Marital Status: Widowed  Intimate Partner Violence: Not on file    Review of Systems  All other systems reviewed and are negative.       Objective    BP (!) 155/72   Pulse 75   Temp 98.1 F (36.7 C) (Oral)   Resp 16   Wt 164 lb 6.4 oz (74.6 kg)   SpO2 96%   BMI 29.12 kg/m   Physical Exam Vitals and nursing note reviewed.  Constitutional:      General: She is not in acute distress. Cardiovascular:     Rate and Rhythm: Normal rate and regular rhythm.  Pulmonary:      Effort: Pulmonary effort is normal.     Breath sounds: Normal breath sounds.  Abdominal:     Palpations: Abdomen is soft.     Tenderness: There is no abdominal tenderness.  Musculoskeletal:  Right knee: Decreased range of motion.     Left knee: Decreased range of motion.     Comments: Utilizing seated walker  Neurological:     General: No focal deficit present.     Mental Status: She is alert and oriented to person, place, and time.         Assessment & Plan:   1. Type 2 diabetes mellitus with stage 3b chronic kidney disease, without long-term current use of insulin (HCC)   2. Diabetes mellitus treated with oral medication (HCC)   3. Primary osteoarthritis of both knees  - HYDROcodone-acetaminophen (NORCO) 7.5-325 MG tablet; Take 1 tablet by mouth every 6 (six) hours as needed.  Dispense: 30 tablet; Refill: 0  4. Essential hypertension Elevated readings.   5. Mixed hyperlipidemia Continue   6. Stage 3 chronic kidney disease, unspecified whether stage 3a or 3b CKD (HCC) Lab results pending - Basic Metabolic Panel  7. Flu vaccine need  - Flu vaccine trivalent PF, 6mos and older(Flulaval,Afluria,Fluarix,Fluzone)    Return in about 3 months (around 06/02/2023) for follow up.   Tommie Raymond, MD

## 2023-03-03 NOTE — Progress Notes (Signed)
mmPatient is here for their 4 month follow-up Patient has no concerns today Care gaps have been discussed with patient

## 2023-03-04 LAB — BASIC METABOLIC PANEL
BUN/Creatinine Ratio: 22 (ref 12–28)
BUN: 23 mg/dL (ref 10–36)
CO2: 22 mmol/L (ref 20–29)
Calcium: 10.3 mg/dL (ref 8.7–10.3)
Chloride: 102 mmol/L (ref 96–106)
Creatinine, Ser: 1.06 mg/dL — ABNORMAL HIGH (ref 0.57–1.00)
Glucose: 105 mg/dL — ABNORMAL HIGH (ref 70–99)
Potassium: 4.4 mmol/L (ref 3.5–5.2)
Sodium: 142 mmol/L (ref 134–144)
eGFR: 50 mL/min/{1.73_m2} — ABNORMAL LOW (ref >59)

## 2023-04-08 DIAGNOSIS — H353221 Exudative age-related macular degeneration, left eye, with active choroidal neovascularization: Secondary | ICD-10-CM | POA: Diagnosis not present

## 2023-04-19 ENCOUNTER — Other Ambulatory Visit: Payer: Self-pay | Admitting: Family Medicine

## 2023-04-19 DIAGNOSIS — I10 Essential (primary) hypertension: Secondary | ICD-10-CM

## 2023-04-19 DIAGNOSIS — E782 Mixed hyperlipidemia: Secondary | ICD-10-CM

## 2023-04-20 ENCOUNTER — Telehealth: Payer: Self-pay | Admitting: *Deleted

## 2023-04-20 NOTE — Telephone Encounter (Signed)
HYDROcodone-acetaminophen (NORCO) 7.5-325 MG table

## 2023-04-21 ENCOUNTER — Other Ambulatory Visit: Payer: Self-pay | Admitting: Family Medicine

## 2023-04-21 DIAGNOSIS — M170 Bilateral primary osteoarthritis of knee: Secondary | ICD-10-CM

## 2023-04-21 MED ORDER — HYDROCODONE-ACETAMINOPHEN 7.5-325 MG PO TABS
1.0000 | ORAL_TABLET | Freq: Four times a day (QID) | ORAL | 0 refills | Status: DC | PRN
Start: 2023-04-21 — End: 2023-07-23

## 2023-04-21 NOTE — Progress Notes (Signed)
hydroc

## 2023-04-22 ENCOUNTER — Other Ambulatory Visit: Payer: Self-pay | Admitting: Family Medicine

## 2023-07-18 ENCOUNTER — Other Ambulatory Visit: Payer: Self-pay | Admitting: Family Medicine

## 2023-07-18 DIAGNOSIS — I10 Essential (primary) hypertension: Secondary | ICD-10-CM

## 2023-07-20 NOTE — Telephone Encounter (Signed)
Requested Prescriptions  Pending Prescriptions Disp Refills   metoprolol succinate (TOPROL-XL) 25 MG 24 hr tablet [Pharmacy Med Name: METOPROLOL SUCC ER 25 MG TAB] 90 tablet 0    Sig: TAKE 1 TABLET (25 MG TOTAL) BY MOUTH DAILY. DX: I10     Cardiovascular:  Beta Blockers Failed - 07/20/2023  9:12 AM      Failed - Last BP in normal range    BP Readings from Last 1 Encounters:  03/03/23 (!) 155/72         Passed - Last Heart Rate in normal range    Pulse Readings from Last 1 Encounters:  03/03/23 75         Passed - Valid encounter within last 6 months    Recent Outpatient Visits           4 months ago Type 2 diabetes mellitus with stage 3b chronic kidney disease, without long-term current use of insulin (HCC)   [place] [facility], Lauris Poag, MD   8 months ago Type 2 diabetes mellitus without complication, without long-term current use of insulin (HCC)   [place] [facility], MD   1 year ago Essential hypertension   [place] [facility], MD   1 year ago Essential hypertension   [place] [facility], MD   2 years ago Essential hypertension   [place] [facility], Lauris Poag, MD       Future Appointments             In 1 month Georganna Skeans, MD [facility]             hydrochlorothiazide (HYDRODIURIL) 12.5 MG tablet [Pharmacy Med Name: HYDROCHLOROTHIAZIDE 12.5 MG TB] 90 tablet 0    Sig: TAKE 1 TABLET BY MOUTH EVERY DAY     Cardiovascular: Diuretics - Thiazide Failed - 07/20/2023  9:12 AM      Failed - Cr in normal range and within 180 days    Creatinine  Date Value Ref Range Status  12/23/2018 44.4 20.0 - 300.0 mg/dL Final   Creat  Date Value Ref Range Status  02/26/2016 0.91 (H) 0.60 - 0.88 mg/dL Final    Comment:      For patients > or = 88 years of age: The upper  reference limit for Creatinine is approximately 13% higher for people identified as African-American.      Creatinine, Ser  Date Value Ref Range Status  03/03/2023 1.06 (H) 0.57 - 1.00 mg/dL Final         Failed - Last BP in normal range    BP Readings from Last 1 Encounters:  03/03/23 (!) 155/72         Passed - K in normal range and within 180 days    Potassium  Date Value Ref Range Status  03/03/2023 4.4 3.5 - 5.2 mmol/L Final         Passed - Na in normal range and within 180 days    Sodium  Date Value Ref Range Status  03/03/2023 142 134 - 144 mmol/L Final         Passed - Valid encounter within last 6 months    Recent Outpatient Visits           4 months ago Type 2 diabetes mellitus with stage 3b chronic kidney disease, without  long-term current use of insulin (HCC)   [place] [facility], Lauris Poag, MD   8 months ago Type 2 diabetes mellitus without complication, without long-term current use of insulin [facility])   [place] [facility], MD   1 year ago Essential hypertension   [place] [facility] [place], [place]   1 year ago Essential hypertension   [place] [facility], MD   2 years ago Essential hypertension   [place] [facility], MD       Future Appointments             In 1 month Georganna Skeans, MD [facility]             amLODipine (NORVASC) 10 MG tablet [Pharmacy Med Name: AMLODIPINE BESYLATE 10 MG TAB] 90 tablet 0    Sig: TAKE 1 TABLET (10 MG TOTAL) BY MOUTH DAILY. DX: I10     Cardiovascular: Calcium Channel Blockers 2 Failed - 07/20/2023  9:12 AM      Failed - Last BP in normal range    BP Readings from Last 1 Encounters:  03/03/23 (!) 155/72         Passed - Last Heart Rate in normal range    Pulse Readings from Last 1 Encounters:  03/03/23 75          Passed - Valid encounter within last 6 months    Recent Outpatient Visits           4 months ago Type 2 diabetes mellitus with stage 3b chronic kidney disease, without long-term current use of insulin (HCC)   [place] [facility], Lauris Poag, MD   8 months ago Type 2 diabetes mellitus without complication, without long-term current use of insulin (HCC)   [place] [facility], MD   1 year ago Essential hypertension   [place] [facility], MD   1 year ago Essential hypertension   [place] [facility], MD   2 years ago Essential hypertension   [place] [facility], MD       Future Appointments             In 1 month Georganna Skeans, MD [facility]

## 2023-07-23 ENCOUNTER — Other Ambulatory Visit: Payer: Self-pay | Admitting: Family Medicine

## 2023-07-23 DIAGNOSIS — M170 Bilateral primary osteoarthritis of knee: Secondary | ICD-10-CM

## 2023-07-23 DIAGNOSIS — E782 Mixed hyperlipidemia: Secondary | ICD-10-CM

## 2023-07-23 MED ORDER — HYDROCODONE-ACETAMINOPHEN 7.5-325 MG PO TABS: 1.0000 | ORAL_TABLET | Freq: Four times a day (QID) | ORAL | 0 refills | Status: DC | PRN

## 2023-07-23 NOTE — Telephone Encounter (Signed)
Copied from CRM [id number]. Topic: Clinical - Medication Refill >> Jul 23, 2023  8:55 AM Phill Myron wrote: Most Recent Primary Care Visit:  Provider: Georganna Skeans  Department: [facility]  Visit Type: OFFICE VISIT  Date: 03/03/2023  Medication: HYDROcodone-acetaminophen (NORCO) 7.5-325 MG tablet  pravastatin (PRAVACHOL) 20 MG tablet   Has the patient contacted their pharmacy? Yes (Agent: If no, request that the patient contact the pharmacy for the refill. If patient does not wish to contact the pharmacy document the reason why and proceed with request.) (Agent: If yes, when and what did the pharmacy advise?)  Is this the correct pharmacy for this prescription? Yes If no, delete pharmacy and type the correct one.  This is the patient's preferred pharmacy:  [facility] [address] [place] - [address] [place] [address] Phone: [phone] Fax: [phone]   Has the prescription been filled recently? Yes  Is the patient out of the medication? No  Has the patient been seen for an appointment in the last year OR does the patient have an upcoming appointment? Yes  Can we respond through MyChart? Yes  Agent: Please be advised that Rx refills may take up to 3 business days. We ask that you follow-up with your pharmacy.

## 2023-07-23 NOTE — Telephone Encounter (Signed)
Last OV 9/3, Next OV 3/4. Removed Pravastatin as it has been filled by provider today.

## 2023-08-03 DIAGNOSIS — K5900 Constipation, unspecified: Secondary | ICD-10-CM | POA: Diagnosis not present

## 2023-08-03 DIAGNOSIS — W19XXXA Unspecified fall, initial encounter: Secondary | ICD-10-CM | POA: Diagnosis not present

## 2023-08-04 ENCOUNTER — Inpatient Hospital Stay (HOSPITAL_COMMUNITY)
Admission: EM | Admit: 2023-08-04 | Discharge: 2023-08-12 | DRG: 478 | Disposition: A | Discharge status: Home Health | Payer: Medicare Other | Attending: Internal Medicine | Admitting: Internal Medicine

## 2023-08-04 ENCOUNTER — Emergency Department (HOSPITAL_COMMUNITY): Payer: Medicare Other

## 2023-08-04 ENCOUNTER — Other Ambulatory Visit: Payer: Self-pay

## 2023-08-04 ENCOUNTER — Encounter (HOSPITAL_COMMUNITY): Payer: Self-pay | Admitting: Emergency Medicine

## 2023-08-04 DIAGNOSIS — D631 Anemia in chronic kidney disease: Secondary | ICD-10-CM | POA: Diagnosis not present

## 2023-08-04 DIAGNOSIS — S22080A Wedge compression fracture of T11-T12 vertebra, initial encounter for closed fracture: Secondary | ICD-10-CM | POA: Diagnosis not present

## 2023-08-04 DIAGNOSIS — M546 Pain in thoracic spine: Secondary | ICD-10-CM | POA: Diagnosis present

## 2023-08-04 DIAGNOSIS — S22070A Wedge compression fracture of T9-T10 vertebra, initial encounter for closed fracture: Secondary | ICD-10-CM | POA: Diagnosis not present

## 2023-08-04 DIAGNOSIS — M8008XA Age-related osteoporosis with current pathological fracture, vertebra(e), initial encounter for fracture: Secondary | ICD-10-CM | POA: Diagnosis not present

## 2023-08-04 DIAGNOSIS — Z862 Personal history of diseases of the blood and blood-forming organs and certain disorders involving the immune mechanism: Secondary | ICD-10-CM

## 2023-08-04 DIAGNOSIS — M170 Bilateral primary osteoarthritis of knee: Secondary | ICD-10-CM | POA: Diagnosis not present

## 2023-08-04 DIAGNOSIS — Z96641 Presence of right artificial hip joint: Secondary | ICD-10-CM | POA: Diagnosis present

## 2023-08-04 DIAGNOSIS — N1831 Chronic kidney disease, stage 3a: Secondary | ICD-10-CM | POA: Diagnosis present

## 2023-08-04 DIAGNOSIS — M419 Scoliosis, unspecified: Secondary | ICD-10-CM | POA: Diagnosis present

## 2023-08-04 DIAGNOSIS — N8110 Cystocele, unspecified: Secondary | ICD-10-CM | POA: Diagnosis present

## 2023-08-04 DIAGNOSIS — E860 Dehydration: Secondary | ICD-10-CM | POA: Diagnosis present

## 2023-08-04 DIAGNOSIS — E119 Type 2 diabetes mellitus without complications: Secondary | ICD-10-CM

## 2023-08-04 DIAGNOSIS — M47814 Spondylosis without myelopathy or radiculopathy, thoracic region: Secondary | ICD-10-CM | POA: Diagnosis not present

## 2023-08-04 DIAGNOSIS — R110 Nausea: Secondary | ICD-10-CM | POA: Diagnosis not present

## 2023-08-04 DIAGNOSIS — I129 Hypertensive chronic kidney disease with stage 1 through stage 4 chronic kidney disease, or unspecified chronic kidney disease: Secondary | ICD-10-CM | POA: Diagnosis present

## 2023-08-04 DIAGNOSIS — E876 Hypokalemia: Secondary | ICD-10-CM | POA: Diagnosis present

## 2023-08-04 DIAGNOSIS — M47815 Spondylosis without myelopathy or radiculopathy, thoracolumbar region: Secondary | ICD-10-CM | POA: Diagnosis not present

## 2023-08-04 DIAGNOSIS — S0990XA Unspecified injury of head, initial encounter: Secondary | ICD-10-CM | POA: Diagnosis not present

## 2023-08-04 DIAGNOSIS — D72829 Elevated white blood cell count, unspecified: Secondary | ICD-10-CM | POA: Diagnosis not present

## 2023-08-04 DIAGNOSIS — M545 Low back pain, unspecified: Secondary | ICD-10-CM

## 2023-08-04 DIAGNOSIS — M1711 Unilateral primary osteoarthritis, right knee: Secondary | ICD-10-CM | POA: Diagnosis present

## 2023-08-04 DIAGNOSIS — Z8249 Family history of ischemic heart disease and other diseases of the circulatory system: Secondary | ICD-10-CM

## 2023-08-04 DIAGNOSIS — K5900 Constipation, unspecified: Secondary | ICD-10-CM | POA: Diagnosis present

## 2023-08-04 DIAGNOSIS — E785 Hyperlipidemia, unspecified: Secondary | ICD-10-CM | POA: Diagnosis not present

## 2023-08-04 DIAGNOSIS — E871 Hypo-osmolality and hyponatremia: Secondary | ICD-10-CM | POA: Diagnosis present

## 2023-08-04 DIAGNOSIS — E8809 Other disorders of plasma-protein metabolism, not elsewhere classified: Secondary | ICD-10-CM | POA: Diagnosis not present

## 2023-08-04 DIAGNOSIS — K219 Gastro-esophageal reflux disease without esophagitis: Secondary | ICD-10-CM | POA: Diagnosis present

## 2023-08-04 DIAGNOSIS — K449 Diaphragmatic hernia without obstruction or gangrene: Secondary | ICD-10-CM | POA: Diagnosis present

## 2023-08-04 DIAGNOSIS — Z823 Family history of stroke: Secondary | ICD-10-CM

## 2023-08-04 DIAGNOSIS — N1832 Chronic kidney disease, stage 3b: Secondary | ICD-10-CM

## 2023-08-04 DIAGNOSIS — E1122 Type 2 diabetes mellitus with diabetic chronic kidney disease: Secondary | ICD-10-CM | POA: Diagnosis present

## 2023-08-04 DIAGNOSIS — E1140 Type 2 diabetes mellitus with diabetic neuropathy, unspecified: Secondary | ICD-10-CM | POA: Diagnosis not present

## 2023-08-04 DIAGNOSIS — Z555 Less than a high school diploma: Secondary | ICD-10-CM

## 2023-08-04 DIAGNOSIS — R112 Nausea with vomiting, unspecified: Secondary | ICD-10-CM | POA: Diagnosis not present

## 2023-08-04 DIAGNOSIS — M5414 Radiculopathy, thoracic region: Secondary | ICD-10-CM | POA: Diagnosis present

## 2023-08-04 DIAGNOSIS — M25561 Pain in right knee: Secondary | ICD-10-CM | POA: Diagnosis present

## 2023-08-04 DIAGNOSIS — M4854XA Collapsed vertebra, not elsewhere classified, thoracic region, initial encounter for fracture: Secondary | ICD-10-CM | POA: Diagnosis not present

## 2023-08-04 DIAGNOSIS — R140 Abdominal distension (gaseous): Secondary | ICD-10-CM | POA: Diagnosis present

## 2023-08-04 DIAGNOSIS — Z79899 Other long term (current) drug therapy: Secondary | ICD-10-CM | POA: Diagnosis not present

## 2023-08-04 DIAGNOSIS — M549 Dorsalgia, unspecified: Secondary | ICD-10-CM | POA: Diagnosis not present

## 2023-08-04 DIAGNOSIS — R29890 Loss of height: Secondary | ICD-10-CM | POA: Diagnosis not present

## 2023-08-04 DIAGNOSIS — M1990 Unspecified osteoarthritis, unspecified site: Secondary | ICD-10-CM | POA: Diagnosis present

## 2023-08-04 DIAGNOSIS — I10 Essential (primary) hypertension: Secondary | ICD-10-CM | POA: Diagnosis not present

## 2023-08-04 DIAGNOSIS — K5730 Diverticulosis of large intestine without perforation or abscess without bleeding: Secondary | ICD-10-CM | POA: Diagnosis not present

## 2023-08-04 DIAGNOSIS — M25461 Effusion, right knee: Secondary | ICD-10-CM | POA: Diagnosis not present

## 2023-08-04 DIAGNOSIS — I6782 Cerebral ischemia: Secondary | ICD-10-CM | POA: Diagnosis not present

## 2023-08-04 DIAGNOSIS — Z85828 Personal history of other malignant neoplasm of skin: Secondary | ICD-10-CM | POA: Diagnosis not present

## 2023-08-04 DIAGNOSIS — Z860100 Personal history of colon polyps, unspecified: Secondary | ICD-10-CM

## 2023-08-04 DIAGNOSIS — Z604 Social exclusion and rejection: Secondary | ICD-10-CM | POA: Diagnosis present

## 2023-08-04 DIAGNOSIS — N3289 Other specified disorders of bladder: Secondary | ICD-10-CM | POA: Diagnosis not present

## 2023-08-04 DIAGNOSIS — K56609 Unspecified intestinal obstruction, unspecified as to partial versus complete obstruction: Secondary | ICD-10-CM | POA: Diagnosis not present

## 2023-08-04 DIAGNOSIS — N183 Chronic kidney disease, stage 3 unspecified: Secondary | ICD-10-CM | POA: Diagnosis present

## 2023-08-04 DIAGNOSIS — M48061 Spinal stenosis, lumbar region without neurogenic claudication: Secondary | ICD-10-CM | POA: Diagnosis present

## 2023-08-04 DIAGNOSIS — Z87891 Personal history of nicotine dependence: Secondary | ICD-10-CM

## 2023-08-04 LAB — COMPREHENSIVE METABOLIC PANEL
ALT: 17 U/L (ref 0–44)
AST: 30 U/L (ref 15–41)
Albumin: 4.1 g/dL (ref 3.5–5.0)
Alkaline Phosphatase: 74 U/L (ref 38–126)
Anion gap: 14 (ref 5–15)
BUN: 23 mg/dL (ref 8–23)
CO2: 24 mmol/L (ref 22–32)
Calcium: 9.5 mg/dL (ref 8.9–10.3)
Chloride: 92 mmol/L — ABNORMAL LOW (ref 98–111)
Creatinine, Ser: 1.1 mg/dL — ABNORMAL HIGH (ref 0.44–1.00)
GFR, Estimated: 47 mL/min — ABNORMAL LOW (ref >60)
Glucose, Bld: 124 mg/dL — ABNORMAL HIGH (ref 70–99)
Potassium: 3.9 mmol/L (ref 3.5–5.1)
Sodium: 130 mmol/L — ABNORMAL LOW (ref 135–145)
Total Bilirubin: 0.8 mg/dL (ref 0.0–1.2)
Total Protein: 8.4 g/dL — ABNORMAL HIGH (ref 6.5–8.1)

## 2023-08-04 LAB — BASIC METABOLIC PANEL
Anion gap: 14 (ref 5–15)
BUN: 22 mg/dL (ref 8–23)
CO2: 22 mmol/L (ref 22–32)
Calcium: 9 mg/dL (ref 8.9–10.3)
Chloride: 97 mmol/L — ABNORMAL LOW (ref 98–111)
Creatinine, Ser: 1.11 mg/dL — ABNORMAL HIGH (ref 0.44–1.00)
GFR, Estimated: 47 mL/min — ABNORMAL LOW (ref >60)
Glucose, Bld: 108 mg/dL — ABNORMAL HIGH (ref 70–99)
Potassium: 3.6 mmol/L (ref 3.5–5.1)
Sodium: 133 mmol/L — ABNORMAL LOW (ref 135–145)

## 2023-08-04 LAB — CBC WITH DIFFERENTIAL/PLATELET
Abs Immature Granulocytes: 0.13 10*3/uL — ABNORMAL HIGH (ref 0.00–0.07)
Basophils Absolute: 0 10*3/uL (ref 0.0–0.1)
Basophils Relative: 0 %
Eosinophils Absolute: 0 10*3/uL (ref 0.0–0.5)
Eosinophils Relative: 0 %
HCT: 39.5 % (ref 36.0–46.0)
Hemoglobin: 13.3 g/dL (ref 12.0–15.0)
Immature Granulocytes: 1 %
Lymphocytes Relative: 22 %
Lymphs Abs: 2.9 10*3/uL (ref 0.7–4.0)
MCH: 29.9 pg (ref 26.0–34.0)
MCHC: 33.7 g/dL (ref 30.0–36.0)
MCV: 88.8 fL (ref 80.0–100.0)
Monocytes Absolute: 1 10*3/uL (ref 0.1–1.0)
Monocytes Relative: 8 %
Neutro Abs: 9.4 10*3/uL — ABNORMAL HIGH (ref 1.7–7.7)
Neutrophils Relative %: 69 %
Platelets: 296 10*3/uL (ref 150–400)
RBC: 4.45 MIL/uL (ref 3.87–5.11)
RDW: 14.3 % (ref 11.5–15.5)
WBC: 13.5 10*3/uL — ABNORMAL HIGH (ref 4.0–10.5)
nRBC: 0 % (ref 0.0–0.2)

## 2023-08-04 LAB — PHOSPHORUS: Phosphorus: 3.2 mg/dL (ref 2.5–4.6)

## 2023-08-04 LAB — CK: Total CK: 60 U/L (ref 38–234)

## 2023-08-04 LAB — VITAMIN D 25 HYDROXY (VIT D DEFICIENCY, FRACTURES): Vit D, 25-Hydroxy: 44.73 ng/mL (ref 30–100)

## 2023-08-04 LAB — TSH: TSH: 2.013 u[IU]/mL (ref 0.350–4.500)

## 2023-08-04 LAB — MAGNESIUM: Magnesium: 2.3 mg/dL (ref 1.7–2.4)

## 2023-08-04 MED ORDER — BISACODYL 10 MG RE SUPP
10.0000 mg | Freq: Every day | RECTAL | Status: DC | PRN
  Administered 2023-08-07: 10 mg via RECTAL
  Filled 2023-08-04: qty 1

## 2023-08-04 MED ORDER — FENTANYL CITRATE PF 50 MCG/ML IJ SOSY
12.5000 ug | PREFILLED_SYRINGE | INTRAMUSCULAR | Status: DC | PRN
  Administered 2023-08-06 – 2023-08-08 (×5): 50 ug via INTRAVENOUS
  Filled 2023-08-04 (×5): qty 1

## 2023-08-04 MED ORDER — ONDANSETRON HCL 4 MG/2ML IJ SOLN
4.0000 mg | Freq: Four times a day (QID) | INTRAMUSCULAR | Status: DC | PRN
  Administered 2023-08-05 – 2023-08-07 (×4): 4 mg via INTRAVENOUS
  Filled 2023-08-04 (×4): qty 2

## 2023-08-04 MED ORDER — ACETAMINOPHEN 325 MG PO TABS: 650.0000 mg | ORAL_TABLET | Freq: Four times a day (QID) | ORAL | Status: DC | PRN

## 2023-08-04 MED ORDER — DOCUSATE SODIUM 100 MG PO CAPS
100.0000 mg | ORAL_CAPSULE | Freq: Two times a day (BID) | ORAL | Status: DC
  Administered 2023-08-04 – 2023-08-06 (×4): 100 mg via ORAL
  Filled 2023-08-04 (×4): qty 1

## 2023-08-04 MED ORDER — POLYETHYLENE GLYCOL 3350 17 G PO PACK
17.0000 g | PACK | Freq: Every day | ORAL | Status: DC | PRN
  Administered 2023-08-05: 17 g via ORAL
  Filled 2023-08-04: qty 1

## 2023-08-04 MED ORDER — ACETAMINOPHEN 650 MG RE SUPP: 650.0000 mg | Freq: Four times a day (QID) | RECTAL | Status: DC | PRN

## 2023-08-04 MED ORDER — SODIUM CHLORIDE 0.9 % IV SOLN: INTRAVENOUS | Status: AC

## 2023-08-04 MED ORDER — MORPHINE SULFATE (PF) 2 MG/ML IV SOLN
2.0000 mg | Freq: Once | INTRAVENOUS | Status: AC
  Administered 2023-08-04: 2 mg via INTRAVENOUS
  Filled 2023-08-04: qty 1

## 2023-08-04 MED ORDER — METHOCARBAMOL 1000 MG/10ML IJ SOLN
500.0000 mg | Freq: Four times a day (QID) | INTRAMUSCULAR | Status: DC | PRN
  Administered 2023-08-06 – 2023-08-10 (×9): 500 mg via INTRAVENOUS
  Filled 2023-08-04 (×9): qty 10

## 2023-08-04 MED ORDER — ONDANSETRON HCL 4 MG/2ML IJ SOLN
4.0000 mg | Freq: Once | INTRAMUSCULAR | Status: AC
  Administered 2023-08-04: 4 mg via INTRAVENOUS
  Filled 2023-08-04: qty 2

## 2023-08-04 MED ORDER — ONDANSETRON 4 MG PO TBDP
4.0000 mg | ORAL_TABLET | Freq: Once | ORAL | Status: AC
  Administered 2023-08-04: 4 mg via ORAL
  Filled 2023-08-04: qty 1

## 2023-08-04 MED ORDER — SODIUM CHLORIDE 0.9 % IV BOLUS
500.0000 mL | Freq: Once | INTRAVENOUS | Status: AC
  Administered 2023-08-04: 500 mL via INTRAVENOUS

## 2023-08-04 MED ORDER — HYDROCODONE-ACETAMINOPHEN 5-325 MG PO TABS
1.0000 | ORAL_TABLET | ORAL | Status: DC | PRN
  Administered 2023-08-04 – 2023-08-05 (×4): 2 via ORAL
  Administered 2023-08-05: 1 via ORAL
  Administered 2023-08-06 (×2): 2 via ORAL
  Administered 2023-08-06: 1 via ORAL
  Administered 2023-08-07 – 2023-08-10 (×9): 2 via ORAL
  Administered 2023-08-10: 1 via ORAL
  Administered 2023-08-10: 2 via ORAL
  Administered 2023-08-11: 1 via ORAL
  Administered 2023-08-12: 2 via ORAL
  Filled 2023-08-04: qty 1
  Filled 2023-08-04 (×4): qty 2
  Filled 2023-08-04: qty 1
  Filled 2023-08-04 (×11): qty 2
  Filled 2023-08-04: qty 1
  Filled 2023-08-04 (×3): qty 2

## 2023-08-04 MED ORDER — OXYCODONE HCL 5 MG PO TABS
2.5000 mg | ORAL_TABLET | Freq: Once | ORAL | Status: AC
  Administered 2023-08-04: 2.5 mg via ORAL
  Filled 2023-08-04: qty 1

## 2023-08-04 MED ORDER — ONDANSETRON HCL 4 MG PO TABS
4.0000 mg | ORAL_TABLET | Freq: Four times a day (QID) | ORAL | Status: DC | PRN
  Administered 2023-08-07: 4 mg via ORAL
  Filled 2023-08-04: qty 1

## 2023-08-04 MED ORDER — IOHEXOL 350 MG/ML SOLN
75.0000 mL | Freq: Once | INTRAVENOUS | Status: AC | PRN
  Administered 2023-08-04: 75 mL via INTRAVENOUS

## 2023-08-04 MED ORDER — SENNA 8.6 MG PO TABS
1.0000 | ORAL_TABLET | Freq: Two times a day (BID) | ORAL | Status: DC
  Administered 2023-08-04 – 2023-08-06 (×4): 8.6 mg via ORAL
  Filled 2023-08-04 (×4): qty 1

## 2023-08-04 NOTE — H&P (Signed)
 ROBI MITTER FMW:[id number] DOB: 01-Jan-1930 DOA: 08/04/2023     PCP: Blush Bleacher, MD     Patient arrived to ER on 08/04/23 at 1006 Referred by Attending Dreama Longs, MD   Patient coming from:    home Lives with disable daughter Chief Complaint:   Chief Complaint  Patient presents with   Back Pain    HPI: Jessica [name] is a 89 y.o. female with medical history significant of hTN, osteoarthritis constipation hiatal hernia CKD, DM2    Presented with   severe back pain Was brought in from home for significant back pain had a fall last week was seen at urgent care yesterday given injection and some nausea medicine But now back pain is unbearable vital signs blood pressure 146/72 heart rate 98 No associated neurological signs no new unilateral weakness or numbness no loss of bladder or bowel no saddle paresthesia Images done in the emergency department showed recent T12 compression fracture 35% height loss  Patient was treated with oxycodone  Zofran  and morphine  and improved Patient was placed in TLSO brace     Denies significant ETOH intake   Does not smoke   No results found for: SARSCOV2NAA      Regarding pertinent Chronic problems:    Hyperlipidemia - on statins Pravachol  Lipid Panel     Component Value Date/Time   CHOL 164 10/29/2022 1100   TRIG 139 10/29/2022 1100   HDL 47 10/29/2022 1100   CHOLHDL 3.5 10/29/2022 1100   CHOLHDL 3.3 02/26/2016 1043   VLDL 31 (H) 02/26/2016 1043   LDLCALC 93 10/29/2022 1100   LABVLDL 24 10/29/2022 1100     HTN on Norvasc , Toprol       CKD stage IIIa  baseline Cr 1.1 CrCl cannot be calculated (Unknown ideal weight.).  Lab Results  Component Value Date   CREATININE 1.10 (H) 08/04/2023   CREATININE 1.06 (H) 03/03/2023   CREATININE 1.23 (H) 10/29/2022   Lab Results  Component Value Date   NA 130 (L) 08/04/2023   CL 92 (L) 08/04/2023   K 3.9 08/04/2023   CO2 24 08/04/2023   BUN 23 08/04/2023   CREATININE  1.10 (H) 08/04/2023   GFRNONAA 47 (L) 08/04/2023   CALCIUM  9.5 08/04/2023   ALBUMIN 4.1 08/04/2023   GLUCOSE 124 (H) 08/04/2023     Hepatic Function Panel     Component Value Date/Time   PROT 8.4 (H) 08/04/2023 1112   PROT 8.2 10/29/2022 1100   ALBUMIN 4.1 08/04/2023 1112   ALBUMIN 5.0 (H) 10/29/2022 1100   AST 30 08/04/2023 1112   ALT 17 08/04/2023 1112   ALKPHOS 74 08/04/2023 1112   BILITOT 0.8 08/04/2023 1112   BILITOT 0.4 10/29/2022 1100     While in ER:   CT showed T12 compression fracture patient started TLSO brace    Lab Orders         Comprehensive metabolic panel         CBC with Differential         Urinalysis, Routine w reflex microscopic -Urine, Clean Catch      CT HEAD   NON acute, Mild chronic small vessel ischemic disease      Right knee severe patellofemoral osteoarthritis  Ct L spine Recent T12 compression fracture with 35% height loss.   CTabd/pelvis -  non acute    Following Medications were ordered in ER: Medications  oxyCODONE  (Oxy IR/ROXICODONE ) immediate release tablet 2.5 mg (2.5 mg Oral Given 08/04/23 1108)  ondansetron  (ZOFRAN -ODT) disintegrating tablet 4 mg (4 mg Oral Given 08/04/23 1108)  iohexol  (OMNIPAQUE ) 350 MG/ML injection 75 mL (75 mLs Intravenous Contrast Given 08/04/23 1427)  morphine  (PF) 2 MG/ML injection 2 mg (2 mg Intravenous Given 08/04/23 1624)  ondansetron  (ZOFRAN ) injection 4 mg (4 mg Intravenous Given 08/04/23 1622)  sodium chloride  0.9 % bolus 500 mL (0 mLs Intravenous Stopped 08/04/23 1807)  morphine  (PF) 2 MG/ML injection 2 mg (2 mg Intravenous Given 08/04/23 1833)       ED Triage Vitals  Encounter Vitals Group     BP 08/04/23 1012 (!) 168/80     Systolic BP Percentile --      Diastolic BP Percentile --      Pulse Rate 08/04/23 1012 81     Resp 08/04/23 1012 16     Temp 08/04/23 1012 98.1 F (36.7 C)     Temp Source 08/04/23 1500 Oral     SpO2 08/04/23 1012 97 %     Weight 08/04/23 1009 165 lb 5.5 oz (75 kg)     Height  --      Head Circumference --      Peak Flow --      Pain Score 08/04/23 1008 10     Pain Loc --      Pain Education --      Exclude from Growth Chart --   UFJK(75)@     _________________________________________ Significant initial  Findings: Abnormal Labs Reviewed  COMPREHENSIVE METABOLIC PANEL - Abnormal; Notable for the following components:      Result Value   Sodium 130 (*)    Chloride 92 (*)    Glucose, Bld 124 (*)    Creatinine, Ser 1.10 (*)    Total Protein 8.4 (*)    GFR, Estimated 47 (*)    All other components within normal limits  CBC WITH DIFFERENTIAL/PLATELET - Abnormal; Notable for the following components:   WBC 13.5 (*)    Neutro Abs 9.4 (*)    Abs Immature Granulocytes 0.13 (*)    All other components within normal limits    Cardiac Panel (last 3 results) Recent Labs    08/04/23 2107  CKTOTAL 60    ECG: Ordered    ____________________ This patient meets SIRS Criteria and may be septic   The recent clinical data is shown below. Vitals:   08/04/23 1500 08/04/23 1617 08/04/23 1619 08/04/23 1807  BP:  (!) 141/72  (!) 150/80  Pulse:  67  79  Resp:  18  18  Temp: 99 F (37.2 C)     TempSrc: Oral     SpO2:  92% 95% 96%  Weight:        WBC     Component Value Date/Time   WBC 13.5 (H) 08/04/2023 1112   LYMPHSABS 2.9 08/04/2023 1112   LYMPHSABS 3.4 (H) 02/07/2020 0933   MONOABS 1.0 08/04/2023 1112   EOSABS 0.0 08/04/2023 1112   EOSABS 0.2 02/07/2020 0933   BASOSABS 0.0 08/04/2023 1112   BASOSABS 0.1 02/07/2020 0933        UA  ordered     Results for orders placed or performed in visit on 04/01/21  Urine Culture     Status: Abnormal   Collection Time: 04/01/21  2:47 PM   Specimen: Urine   Urine  Result Value Ref Range Status   Urine Culture, Routine Final report (A)  Final   Organism ID, Bacteria Escherichia coli (A)  Final  Comment: Cefazolin  <=4 ug/mL Cefazolin  with an MIC <=16 predicts susceptibility to the oral agents cefaclor,  cefdinir, cefpodoxime, cefprozil, cefuroxime, cephalexin , and loracarbef when used for therapy of uncomplicated urinary tract infections due to E. coli, Klebsiella pneumoniae, and Proteus mirabilis. Greater than 100,000 colony forming units per mL    ORGANISM ID, BACTERIA Comment  Final    Comment: Mixed urogenital flora Less than 10,000 colonies/mL    Antimicrobial Susceptibility Comment  Final    Comment:       ** S = Susceptible; I = Intermediate; R = Resistant **                    P = Positive; N = Negative             MICS are expressed in micrograms per mL    Antibiotic                 RSLT#1    RSLT#2    RSLT#3    RSLT#4 Amoxicillin/Clavulanic Acid    S Ampicillin                     S Cefepime                       S Ceftriaxone                    S Cefuroxime                     S Ciprofloxacin                  S Ertapenem                      S Gentamicin                     S Imipenem                       S Levofloxacin                   S Meropenem                      S Nitrofurantoin                  S Piperacillin/Tazobactam        S Tetracycline                   S Tobramycin                     S Trimethoprim/Sulfa             S     _______________________________ Recent Labs  Lab 08/04/23 1112 08/04/23 2107  NA 130* 133*  K 3.9 3.6  CO2 24 22  GLUCOSE 124* 108*  BUN 23 22  CREATININE 1.10* 1.11*  CALCIUM  9.5 9.0  MG  --  2.3  PHOS  --  3.2    Cr  stable,   Lab Results  Component Value Date   CREATININE 1.10 (H) 08/04/2023   CREATININE 1.06 (H) 03/03/2023   CREATININE 1.23 (H) 10/29/2022    Recent Labs  Lab 08/04/23 1112  AST 30  ALT 17  ALKPHOS 74  BILITOT 0.8  PROT 8.4*  ALBUMIN 4.1   Lab  Results  Component Value Date   CALCIUM  9.5 08/04/2023  Plt: Lab Results  Component Value Date   PLT 296 08/04/2023   Recent Labs  Lab 08/04/23 1112  WBC 13.5*  NEUTROABS 9.4*  HGB 13.3  HCT 39.5  MCV 88.8  PLT 296    HG/HCT  stable,      Component Value Date/Time   HGB 13.3 08/04/2023 1112   HGB 12.4 08/01/2020 1132   HCT 39.5 08/04/2023 1112   HCT 37.3 08/01/2020 1132   MCV 88.8 08/04/2023 1112   MCV 93 08/01/2020 1132  _______________________________________________ Hospitalist was called for admission for  Compression fracture of T12 vertebra,Dehydration  The following Work up has been ordered so far:  Orders Placed This Encounter  Procedures   CT Head Wo Contrast   CT ABDOMEN PELVIS W CONTRAST   CT L-SPINE NO CHARGE   DG Knee Complete 4 Views Right   Comprehensive metabolic panel   CBC with Differential   Urinalysis, Routine w reflex microscopic -Urine, Clean Catch   Apply TLSO brace   Maintain TLSO brace   Consult to hospitalist     OTHER Significant initial  Findings:  labs showing:     DM  labs:  HbA1C: Recent Labs    10/29/22 1033  HGBA1C 5.8*      CBG (last 3)  No results for input(s): GLUCAP in the last 72 hours.        Cultures: No results found for: SDES, SPECREQUEST, CULT, REPTSTATUS   Radiological Exams on Admission: CT L-SPINE NO CHARGE Result Date: 08/04/2023 CLINICAL DATA:  Back pain since a fall last week. EXAM: CT LUMBAR SPINE WITH CONTRAST TECHNIQUE: Technique: Multiplanar CT images of the lumbar spine were reconstructed from contemporary CT of the Abdomen and Pelvis. RADIATION DOSE REDUCTION: This exam was performed according to the departmental dose-optimization program which includes automated exposure control, adjustment of the mA and/or kV according to patient size and/or use of iterative reconstruction technique. CONTRAST:  No additional. COMPARISON:  Lumbar spine MRI 02/02/2006 FINDINGS: Segmentation: 5 lumbar type vertebrae. Alignment: Mild lumbar levoscoliosis. Trace retrolisthesis of L3 on L4. Vertebrae: T12 compression fracture with 35% vertebral body height loss centrally and recent appearance with visible fracture line. No significant retropulsion  or posterior element fracture. Diffuse osteopenia. No destructive bone lesion. Paraspinal and other soft tissues: Paravertebral soft tissue edema at T12. Remainder of the abdomen and pelvis reported separately. Disc levels: Multilevel disc degeneration, most advanced on the right at L3-4 and L4-5. Mild right greater than left neural foraminal stenosis at L3-4 and mild-to-moderate right neural foraminal stenosis at L4-5. No evidence of high-grade spinal canal stenosis. IMPRESSION: Recent T12 compression fracture with 35% height loss. Electronically Signed   By: Dasie Hamburg M.D.   On: 08/04/2023 15:29   CT Head Wo Contrast Result Date: 08/04/2023 CLINICAL DATA:  Head trauma, minor (Age >= 65y).  Recent fall. EXAM: CT HEAD WITHOUT CONTRAST TECHNIQUE: Contiguous axial images were obtained from the base of the skull through the vertex without intravenous contrast. RADIATION DOSE REDUCTION: This exam was performed according to the departmental dose-optimization program which includes automated exposure control, adjustment of the mA and/or kV according to patient size and/or use of iterative reconstruction technique. COMPARISON:  None Available. FINDINGS: Brain: There is no evidence of an acute infarct, intracranial hemorrhage, mass, or midline shift. Cerebral white matter hypodensities are nonspecific but compatible with mild chronic small vessel ischemic disease. There is mild cerebral atrophy. Slightly asymmetrically prominent  extra-axial CSF over the left frontal convexity is without significant mass effect and is favored to be secondary to atrophy, although a very small subdural hygroma is not excluded. Vascular: Calcified atherosclerosis at the skull base. No hyperdense vessel. Skull: No acute fracture or suspicious osseous lesion. Sinuses/Orbits: Right sphenoid sinus mucous retention cyst. Clear mastoid air cells. Bilateral cataract extraction. Other: None. IMPRESSION: 1. No evidence of acute intracranial  abnormality. 2. Mild chronic small vessel ischemic disease and cerebral atrophy. Electronically Signed   By: Dasie Hamburg M.D.   On: 08/04/2023 15:22   CT ABDOMEN PELVIS W CONTRAST Result Date: 08/04/2023 CLINICAL DATA:  Bowel obstruction suspected Abdominal pain, acute, nonlocalized EXAM: CT ABDOMEN AND PELVIS WITH CONTRAST TECHNIQUE: Multidetector CT imaging of the abdomen and pelvis was performed using the standard protocol following bolus administration of intravenous contrast. RADIATION DOSE REDUCTION: This exam was performed according to the departmental dose-optimization program which includes automated exposure control, adjustment of the mA and/or kV according to patient size and/or use of iterative reconstruction technique. CONTRAST:  75mL OMNIPAQUE  IOHEXOL  350 MG/ML SOLN COMPARISON:  None Available. FINDINGS: Lower chest: Moderate hiatal hernia. No pleural or pericardial effusion. Mild dependent atelectasis in the lung bases. Hepatobiliary: Gallbladder physiologically distended without calcified stones. Ectatic CBD 1.2 cm diameter. Minimal central intrahepatic biliary ductal ectasia. No focal liver lesion. Pancreas: Unremarkable. No pancreatic ductal dilatation or surrounding inflammatory changes. Spleen: Normal in size. Coarse subcapsular calcifications. No acute lesion. Adrenals/Urinary Tract: No adrenal mass. No urolithiasis. No hydronephrosis. 3 cm 9 HU probable cyst, left lower pole; no follow-up warranted. Urinary bladder is distended. Stomach/Bowel: Moderate hiatal hernia. Stomach is nondilated. Small bowel decompressed. Appendix not identified. The colon is partially distended, with scattered sigmoid diverticula; no adjacent inflammatory change. Vascular/Lymphatic: Moderate calcified aortoiliac plaque without aneurysm. Portal vein patent. No abdominal or pelvic adenopathy. Reproductive: Status post hysterectomy. No adnexal masses. Other: No ascites.  No free air. Musculoskeletal: Mild T12  vertebral compression deformity without significant retropulsion. Multilevel spondylitic change in the lumbar spine. IMPRESSION: 1. No acute findings. 2. Moderate hiatal hernia. 3. Sigmoid diverticulosis. Electronically Signed   By: JONETTA Faes M.D.   On: 08/04/2023 15:16   DG Knee Complete 4 Views Right Result Date: 08/04/2023 CLINICAL DATA:  Fall.  Right knee pain. EXAM: RIGHT KNEE - COMPLETE 4+ VIEW COMPARISON:  None Available. FINDINGS: There is diffuse decreased bone mineralization. Severe patellofemoral joint space narrowing with diffuse bone-on-bone contact, subchondral sclerosis, and moderate peripheral osteophytosis. Tiny joint effusion. Mild medial and lateral compartment chondrocalcinosis without significant joint space narrowing. No definitive acute fracture line is seen. Mild atherosclerotic calcifications. IMPRESSION: 1. Severe patellofemoral osteoarthritis. 2. Tiny joint effusion. 3. No definitive acute fracture line is seen. Electronically Signed   By: Tanda Lyons M.D.   On: 08/04/2023 12:18   _______________________________________________________________________________________________________ Latest  Blood pressure (!) 150/80, pulse 79, temperature 99 F (37.2 C), temperature source Oral, resp. rate 18, weight 75 kg, SpO2 96%.   Vitals  labs and radiology finding personally reviewed  Review of Systems:    Pertinent positives include: back pain.   Constitutional:  No weight loss, night sweats, Fevers, chills, fatigue, weight loss  HEENT:  No headaches, Difficulty swallowing,Tooth/dental problems,Sore throat,  No sneezing, itching, ear ache, nasal congestion, post nasal drip,  Cardio-vascular:  No chest pain, Orthopnea, PND, anasarca, dizziness, palpitations.no Bilateral lower extremity swelling  GI:  No heartburn, indigestion, abdominal pain, nausea, vomiting, diarrhea, change in bowel habits, loss of appetite, melena, blood in stool,  hematemesis Resp:  no shortness of  breath at rest. No dyspnea on exertion, No excess mucus, no productive cough, No non-productive cough, No coughing up of blood.No change in color of mucus.No wheezing. Skin:  no rash or lesions. No jaundice GU:  no dysuria, change in color of urine, no urgency or frequency. No straining to urinate.  No flank pain.  Musculoskeletal:  No joint pain or no joint swelling. No decreased range of motion. No  Psych:  No change in mood or affect. No depression or anxiety. No memory loss.  Neuro: no localizing neurological complaints, no tingling, no weakness, no double vision, no gait abnormality, no slurred speech, no confusion  All systems reviewed and apart from HOPI all are negative _______________________________________________________________________________________________ Past Medical History:   Past Medical History:  Diagnosis Date   Allergy    Back pain    herniated disc   Basal cell carcinoma of face 11/2014   LEFT Temple   Cataracts, bilateral    bilateral removed   Colon polyps    Cystocele    Essential hypertension, benign    takes Amlodipine  and Prinizide daily   GERD (gastroesophageal reflux disease)    takes Nexium  daily   Glaucoma    pt denies   Hyperlipidemia    takes Pravastatin  daily   Impaired hearing    Incontinence    occasionally   Iron deficiency anemia due to chronic blood loss 02/16/2016   Due to chronic cameron ulcers. See GI notes. Continue PPI and iron supplementation.   Low bone mass 02/2008   Lymphocytic colitis    Osteoarthritis    hip, knee; severe   Osteoporosis    Postoperative anemia due to acute blood loss 09/26/2011   Type II or unspecified type diabetes mellitus without mention of complication, not stated as uncontrolled 08/2010   Diet controlled     Past Surgical History:  Procedure Laterality Date   BASAL CELL CARCINOMA EXCISION Left 12/14/2014   LEFT temple   cataract surgery 04/25/13; 05/16/2013     COLONOSCOPY     JOINT  REPLACEMENT N/A    [employer] 07/29/2020   TOE SURGERY     bilateral   TONSILLECTOMY AND ADENOIDECTOMY     TOTAL HIP ARTHROPLASTY  09/24/2011   Procedure: TOTAL HIP ARTHROPLASTY;  Surgeon: Toribio JULIANNA Chancy, MD;  Location: [facility] OR;  Service: Orthopedics;  Laterality: Right;    Social History:  Ambulatory  walker   reports that she quit smoking about 28 years ago. Her smoking use included cigarettes. She has never used smokeless tobacco. She reports that she does not drink alcohol and does not use drugs.   Family History:  Family History  Problem Relation Age of Onset   Stroke Mother 65   Hypertension Mother    Heart disease Father 38       Endocarditis   Rheumatic fever Father    Obesity Daughter        unable to walk very far   Arthritis Daughter    Anesthesia problems Neg Hx    Hypotension Neg Hx    Pseudochol deficiency Neg Hx    Malignant hyperthermia Neg Hx    Colon cancer Neg Hx    Esophageal cancer Neg Hx    Pancreatic cancer Neg Hx    Rectal cancer Neg Hx    Stomach cancer Neg Hx    ______________________________________________________________________________________________ Allergies: No Known Allergies   Prior to Admission medications   Medication Sig Start Date End Date  Taking? Authorizing Provider  amLODipine  (NORVASC ) 10 MG tablet TAKE 1 TABLET (10 MG TOTAL) BY MOUTH DAILY. DX: I10 07/20/23   Tanda Bleacher, MD  esomeprazole  (NEXIUM ) 20 MG capsule Take 1 capsule (20 mg total) by mouth daily at 12 noon. 03/10/18   Stallings, Zoe A, MD  GLUCOSAMINE PO Take by mouth.    [provider]  hydrochlorothiazide  (HYDRODIURIL ) 12.5 MG tablet TAKE 1 TABLET BY MOUTH EVERY DAY 07/20/23   Tanda Bleacher, MD  HYDROcodone -acetaminophen  (NORCO) 7.5-325 MG tablet Take 1 tablet by mouth every 6 (six) hours as needed. 07/23/23   Tanda Bleacher, MD  metoprolol  succinate (TOPROL -XL) 25 MG 24 hr tablet TAKE 1 TABLET (25 MG TOTAL) BY MOUTH DAILY. DX: I10 07/20/23   Tanda Bleacher,  MD  pravastatin  (PRAVACHOL ) 20 MG tablet TAKE 1 TABLET BY MOUTH EVERY DAY IN THE EVENING 07/23/23   Tanda Bleacher, MD  predniSONE  (DELTASONE ) 50 MG tablet Take 1 tablet (50 mg total) by mouth daily with breakfast. 04/30/22   Tanda Bleacher, MD    ___________________________________________________________________________________________________ Physical Exam:    08/04/2023    6:07 PM 08/04/2023    4:17 PM 08/04/2023    2:26 PM  Vitals with BMI  Systolic 150 141 828  Diastolic 80 72 84  Pulse 79 67 99    1. General:  in No  Acute distress    Chronically ill -appearing 2. Psychological: Alert and  Oriented 3. Head/ENT:    Dry Mucous Membranes                          Head Non traumatic, neck supple                         Poor Dentition 4. SKIN:  decreased Skin turgor,  Skin clean Dry and intact no rash    5. Heart: Regular rate and rhythm systolic Murmur, no Rub or gallop 6. Lungs:  no wheezes or crackles   7. Abdomen: Soft,  non-tender, Non distended bowel sounds present 8. Lower extremities: no clubbing, cyanosis, no  edema 9. Neurologically Grossly intact, moving all 4 extremities equally   10. MSK: Normal range of motion    Chart has been reviewed  ______________________________________________________________________________________________  Assessment/Plan 89 y.o. female with medical history significant of hTN, osteoarthritis constipation hiatal hernia CKD, Dm2  Admitted for  Compression fracture of T12 vertebra,  Dehydration    Present on Admission:  T12 compression fracture (HCC)  Back pain  HTN (hypertension)  Hyperlipidemia  Osteoarthritis  Stage 3 chronic kidney disease (HCC)  Hyponatremia  Dehydration  Constipation     Back pain Secondary to T12 compression fracture Pain management TLsO brace may need kyphoplasty if not controlled Will need to have follow-up with spine specialty  Cystocele Bowel regimen  History of iron deficiency anemia Chronic  stable Obtain anemia panel  Transfuse for Hg <7 , rapidly dropping or  if symptomatic   HTN (hypertension) Continue Norvasc  10 mg a day  Hyperlipidemia Continue oral Pravachol  20 mg a day  Osteoarthritis Check vitamin D  level  Stage 3 chronic kidney disease (HCC)  -chronic avoid nephrotoxic medications such as NSAIDs, Vanco Zosyn combo,  avoid hypotension, continue to follow renal function   Type 2 diabetes mellitus without complication, without long-term current use of insulin  (HCC)  - Order Sensitive SSI    -  check TSH and HgA1C  - Hold by mouth medications  T12 compression fracture (HCC) Pain control  Hyponatremia In the setting of severe pain Rehydrate Obtain urine electrolytes  Dehydration Will rehydrate  Constipation Bowel regimen   Other plan as per orders.  DVT prophylaxis:  SCD    Code Status:    Code Status: Not on file FULL CODE as per patient  I had personally discussed CODE STATUS with patient a  ACP   none    Family Communication:   Family not at  Bedside    Diet  diabetic diet  Disposition Plan:      To home once workup is complete and patient is stable   Following barriers for discharge:                                                          Electrolytes corrected                                                         Pain controlled with PO medications                                      Consult Orders  (From admission, onward)           Start     Ordered   08/04/23 1839  Consult to hospitalist  PG SENT BY DELORIS  Once       Provider:  (Not yet assigned)  Question Answer Comment  Place call to: [facility]   Reason for Consult Admit      08/04/23 1838             Transition of care consulted                    Consults called: none     Admission status:  ED Disposition     ED Disposition  Admit   Condition  --   Comment  Hospital Area: [facility] [[id number]]  Level of Care:  Telemetry Medical [[id number]]  May place patient in observation at [facility] or [facility] if equivalent level of care is available:: No  Covid Evaluation: Asymptomatic - no recent exposure (last 10 days) testing not required  Diagnosis: T12 compression fracture [facility]) [[id number]]  Admitting Physician: Theone Bowell [[id number]]  Attending Physician: Kechia Yahnke [[id number]]           Obs     Level of care     tele  For 12H      Jaquarious Grey 08/04/2023, 10:12 PM    [facility]     after 2 AM please page floor coverage PA If 7AM-7PM, please contact the day team taking care of the patient using [email or web address]

## 2023-08-04 NOTE — ED Notes (Signed)
Report given to [employer]

## 2023-08-04 NOTE — ED Notes (Signed)
 Ortho at bedside.

## 2023-08-04 NOTE — Assessment & Plan Note (Signed)
 Check vitamin D level

## 2023-08-04 NOTE — ED Notes (Signed)
Pt ambulated 80 ft to the bathroom with a walker and used a bedside commode.

## 2023-08-04 NOTE — Assessment & Plan Note (Signed)
Continue oral Pravachol 20 mg a day

## 2023-08-04 NOTE — Subjective & Objective (Signed)
 Was brought in from home for significant back pain had a fall last week was seen at urgent care yesterday given injection and some nausea medicine But now back pain is unbearable vital signs blood pressure 146/72 heart rate 98 No associated neurological signs no new unilateral weakness or numbness no loss of bladder or bowel no saddle paresthesia Images done in the emergency department showed recent T12 compression fracture 35% height loss  Patient was treated with oxycodone  Zofran  and morphine  and improved Patient was placed in TLSO brace

## 2023-08-04 NOTE — Assessment & Plan Note (Addendum)
In the setting of severe pain Rehydrate Obtain urine electrolytes

## 2023-08-04 NOTE — Assessment & Plan Note (Signed)
 Pain control

## 2023-08-04 NOTE — Assessment & Plan Note (Signed)
 Continue Norvasc 10 mg a day

## 2023-08-04 NOTE — ED Provider Notes (Signed)
 [place] EMERGENCY DEPARTMENT AT [facility] Provider Note   CSN: [id number] Arrival date & time: 08/04/23  1006     History Chief Complaint  Patient presents with   Back Pain    Jessica [name] is a 89 y.o. female.  Patient with past history significant for hypertension, osteoarthritis, constipation, hiatal hernia, CKD presents the emergency department with concerns of back pain.  She reports that she had a mechanical fall about 1 week ago on Thursday.  Endorsing pain to her mid back since then.  Was seen at urgent care yesterday and received some medication as well as nausea medicine without improvement in symptoms.  States that she is not having unbearable pain and difficulty with movement.  Denies any unilateral weakness or numbness, loss of bowel or bladder control, or saddle paresthesia.  Also endorses some concern for constipation as she has not a bowel movement in the last week due to worsening pain when she tries to have a bowel movement.  Back Pain      Home Medications Prior to Admission medications   Medication Sig Start Date End Date Taking? Authorizing Provider  amLODipine  (NORVASC ) 10 MG tablet TAKE 1 TABLET (10 MG TOTAL) BY MOUTH DAILY. DX: I10 07/20/23   Blush Bleacher, MD  esomeprazole  (NEXIUM ) 20 MG capsule Take 1 capsule (20 mg total) by mouth daily at 12 noon. 03/10/18   Stallings, Zoe A, MD  GLUCOSAMINE PO Take by mouth.    [provider]  hydrochlorothiazide  (HYDRODIURIL ) 12.5 MG tablet TAKE 1 TABLET BY MOUTH EVERY DAY 07/20/23   Blush Bleacher, MD  HYDROcodone -acetaminophen  (NORCO) 7.5-325 MG tablet Take 1 tablet by mouth every 6 (six) hours as needed. 07/23/23   Blush Bleacher, MD  metoprolol  succinate (TOPROL -XL) 25 MG 24 hr tablet TAKE 1 TABLET (25 MG TOTAL) BY MOUTH DAILY. DX: I10 07/20/23   Blush Bleacher, MD  pravastatin  (PRAVACHOL ) 20 MG tablet TAKE 1 TABLET BY MOUTH EVERY DAY IN THE EVENING 07/23/23   Blush Bleacher, MD  predniSONE   (DELTASONE ) 50 MG tablet Take 1 tablet (50 mg total) by mouth daily with breakfast. 04/30/22   Blush Bleacher, MD      Allergies    Patient has no known allergies.    Review of Systems   Review of Systems  Musculoskeletal:  Positive for back pain.  All other systems reviewed and are negative.   Physical Exam Updated Vital Signs BP (!) 171/84 (BP Location: Right Arm)   Pulse 99   Temp 99 F (37.2 C) (Oral)   Resp 18   Wt 75 kg   SpO2 95%   BMI 29.29 kg/m  Physical Exam Vitals and nursing note reviewed.  Constitutional:      General: She is not in acute distress.    Appearance: She is well-developed.  HENT:     Head: Normocephalic and atraumatic.  Eyes:     Conjunctiva/sclera: Conjunctivae normal.  Cardiovascular:     Rate and Rhythm: Normal rate and regular rhythm.     Heart sounds: No murmur heard. Pulmonary:     Effort: Pulmonary effort is normal. No respiratory distress.     Breath sounds: Normal breath sounds.  Abdominal:     Palpations: Abdomen is soft.     Tenderness: There is no abdominal tenderness.  Musculoskeletal:        General: Tenderness and signs of injury present. No swelling.       Arms:     Cervical back: Neck  supple.     Comments: Midline tenderness in the mid/low back. No paraspinal pain. Unable to move from lying to sitting without severe pain.   Skin:    General: Skin is warm and dry.     Capillary Refill: Capillary refill takes less than 2 seconds.  Neurological:     Mental Status: She is alert.  Psychiatric:        Mood and Affect: Mood normal.     ED Results / Procedures / Treatments   Labs (all labs ordered are listed, but only abnormal results are displayed) Labs Reviewed  COMPREHENSIVE METABOLIC PANEL - Abnormal; Notable for the following components:      Result Value   Sodium 130 (*)    Chloride 92 (*)    Glucose, Bld 124 (*)    Creatinine, Ser 1.10 (*)    Total Protein 8.4 (*)    GFR, Estimated 47 (*)    All other  components within normal limits  CBC WITH DIFFERENTIAL/PLATELET - Abnormal; Notable for the following components:   WBC 13.5 (*)    Neutro Abs 9.4 (*)    Abs Immature Granulocytes 0.13 (*)    All other components within normal limits  URINALYSIS, ROUTINE W REFLEX MICROSCOPIC    EKG None  Radiology CT L-SPINE NO CHARGE Result Date: 08/04/2023 CLINICAL DATA:  Back pain since a fall last week. EXAM: CT LUMBAR SPINE WITH CONTRAST TECHNIQUE: Technique: Multiplanar CT images of the lumbar spine were reconstructed from contemporary CT of the Abdomen and Pelvis. RADIATION DOSE REDUCTION: This exam was performed according to the departmental dose-optimization program which includes automated exposure control, adjustment of the mA and/or kV according to patient size and/or use of iterative reconstruction technique. CONTRAST:  No additional. COMPARISON:  Lumbar spine MRI 02/02/2006 FINDINGS: Segmentation: 5 lumbar type vertebrae. Alignment: Mild lumbar levoscoliosis. Trace retrolisthesis of L3 on L4. Vertebrae: T12 compression fracture with 35% vertebral body height loss centrally and recent appearance with visible fracture line. No significant retropulsion or posterior element fracture. Diffuse osteopenia. No destructive bone lesion. Paraspinal and other soft tissues: Paravertebral soft tissue edema at T12. Remainder of the abdomen and pelvis reported separately. Disc levels: Multilevel disc degeneration, most advanced on the right at L3-4 and L4-5. Mild right greater than left neural foraminal stenosis at L3-4 and mild-to-moderate right neural foraminal stenosis at L4-5. No evidence of high-grade spinal canal stenosis. IMPRESSION: Recent T12 compression fracture with 35% height loss. Electronically Signed   By: Dasie Hamburg M.D.   On: 08/04/2023 15:29   CT Head Wo Contrast Result Date: 08/04/2023 CLINICAL DATA:  Head trauma, minor (Age >= 65y).  Recent fall. EXAM: CT HEAD WITHOUT CONTRAST TECHNIQUE: Contiguous  axial images were obtained from the base of the skull through the vertex without intravenous contrast. RADIATION DOSE REDUCTION: This exam was performed according to the departmental dose-optimization program which includes automated exposure control, adjustment of the mA and/or kV according to patient size and/or use of iterative reconstruction technique. COMPARISON:  None Available. FINDINGS: Brain: There is no evidence of an acute infarct, intracranial hemorrhage, mass, or midline shift. Cerebral white matter hypodensities are nonspecific but compatible with mild chronic small vessel ischemic disease. There is mild cerebral atrophy. Slightly asymmetrically prominent extra-axial CSF over the left frontal convexity is without significant mass effect and is favored to be secondary to atrophy, although a very small subdural hygroma is not excluded. Vascular: Calcified atherosclerosis at the skull base. No hyperdense vessel. Skull: No acute fracture  or suspicious osseous lesion. Sinuses/Orbits: Right sphenoid sinus mucous retention cyst. Clear mastoid air cells. Bilateral cataract extraction. Other: None. IMPRESSION: 1. No evidence of acute intracranial abnormality. 2. Mild chronic small vessel ischemic disease and cerebral atrophy. Electronically Signed   By: Dasie Hamburg M.D.   On: 08/04/2023 15:22   CT ABDOMEN PELVIS W CONTRAST Result Date: 08/04/2023 CLINICAL DATA:  Bowel obstruction suspected Abdominal pain, acute, nonlocalized EXAM: CT ABDOMEN AND PELVIS WITH CONTRAST TECHNIQUE: Multidetector CT imaging of the abdomen and pelvis was performed using the standard protocol following bolus administration of intravenous contrast. RADIATION DOSE REDUCTION: This exam was performed according to the departmental dose-optimization program which includes automated exposure control, adjustment of the mA and/or kV according to patient size and/or use of iterative reconstruction technique. CONTRAST:  75mL OMNIPAQUE  IOHEXOL   350 MG/ML SOLN COMPARISON:  None Available. FINDINGS: Lower chest: Moderate hiatal hernia. No pleural or pericardial effusion. Mild dependent atelectasis in the lung bases. Hepatobiliary: Gallbladder physiologically distended without calcified stones. Ectatic CBD 1.2 cm diameter. Minimal central intrahepatic biliary ductal ectasia. No focal liver lesion. Pancreas: Unremarkable. No pancreatic ductal dilatation or surrounding inflammatory changes. Spleen: Normal in size. Coarse subcapsular calcifications. No acute lesion. Adrenals/Urinary Tract: No adrenal mass. No urolithiasis. No hydronephrosis. 3 cm 9 HU probable cyst, left lower pole; no follow-up warranted. Urinary bladder is distended. Stomach/Bowel: Moderate hiatal hernia. Stomach is nondilated. Small bowel decompressed. Appendix not identified. The colon is partially distended, with scattered sigmoid diverticula; no adjacent inflammatory change. Vascular/Lymphatic: Moderate calcified aortoiliac plaque without aneurysm. Portal vein patent. No abdominal or pelvic adenopathy. Reproductive: Status post hysterectomy. No adnexal masses. Other: No ascites.  No free air. Musculoskeletal: Mild T12 vertebral compression deformity without significant retropulsion. Multilevel spondylitic change in the lumbar spine. IMPRESSION: 1. No acute findings. 2. Moderate hiatal hernia. 3. Sigmoid diverticulosis. Electronically Signed   By: JONETTA Faes M.D.   On: 08/04/2023 15:16   DG Knee Complete 4 Views Right Result Date: 08/04/2023 CLINICAL DATA:  Fall.  Right knee pain. EXAM: RIGHT KNEE - COMPLETE 4+ VIEW COMPARISON:  None Available. FINDINGS: There is diffuse decreased bone mineralization. Severe patellofemoral joint space narrowing with diffuse bone-on-bone contact, subchondral sclerosis, and moderate peripheral osteophytosis. Tiny joint effusion. Mild medial and lateral compartment chondrocalcinosis without significant joint space narrowing. No definitive acute fracture line  is seen. Mild atherosclerotic calcifications. IMPRESSION: 1. Severe patellofemoral osteoarthritis. 2. Tiny joint effusion. 3. No definitive acute fracture line is seen. Electronically Signed   By: Tanda Lyons M.D.   On: 08/04/2023 12:18    Procedures Procedures    Medications Ordered in ED Medications  morphine  (PF) 2 MG/ML injection 2 mg (has no administration in time range)  ondansetron  (ZOFRAN ) injection 4 mg (has no administration in time range)  sodium chloride  0.9 % bolus 500 mL (has no administration in time range)  oxyCODONE  (Oxy IR/ROXICODONE ) immediate release tablet 2.5 mg (2.5 mg Oral Given 08/04/23 1108)  ondansetron  (ZOFRAN -ODT) disintegrating tablet 4 mg (4 mg Oral Given 08/04/23 1108)  iohexol  (OMNIPAQUE ) 350 MG/ML injection 75 mL (75 mLs Intravenous Contrast Given 08/04/23 1427)    ED Course/ Medical Decision Making/ A&P                                 Medical Decision Making Risk Prescription drug management. Decision regarding hospitalization.   This patient presents to the ED for concern of back pain.  Differential diagnosis  includes cauda equina syndrome, spinal abscess, discitis, compression fracture   Lab Tests:  I Ordered, and personally interpreted labs.  The pertinent results include: CBC with mild leukocytosis at 13.5 likely reactive to pain, CMP with evidence of renal dysfunction close to her baseline with creatinine at 1.1 and GFR at 47 close about dehydration also likely with sodium and chloride slightly decreased at 130 and 92.  UA pending at time of admission   Imaging Studies ordered:  I ordered imaging studies including right knee x-ray, CT abdomen pelvis, CT L-spine, CT head I independently visualized and interpreted imaging which showed Severe patellofemoral osteoarthritis. 2. Tiny joint effusion. 3. No definitive acute fracture line is seen.No acute findings. 2. Moderate hiatal hernia. 3. Sigmoid diverticulosis.Recent T12 compression fracture  with 35% height loss.No evidence of acute intracranial abnormality. 2. Mild chronic small vessel ischemic disease and cerebral atrophy. I agree with the radiologist interpretation   Medicines ordered and prescription drug management:  I ordered medication including oxycodone , Zofran , morphine , fluids for pain, nausea, dehydration Reevaluation of the patient after these medicines showed that the patient improved I have reviewed the patients home medicines and have made adjustments as needed   Problem List / ED Course:  Patient with past history significant for hypertension, osteoarthritis, constipation, hiatal hernia, CKD who presents emergency department concerns of back pain.  She endorses a mechanical fall about 1 week ago which lost her footing and fell backwards landing on her low back.  Pain in this area is progressively worsened over that time.  She denies any paresthesia, bowel or bladder incontinence, unilateral weakness or numbness, or any acute abnormality.  No acute concerns suggesting cauda equina syndrome.  However concern is for possible compression deformity given history of low bone mass. Patient with physical exam concerning for midline tenderness in the mid/low back.  Difficult moving patient from lying position due to pain.  No obvious deformity or step-off. Patient given oxycodone  and Zofran  for pain and nausea without significant treatment.  Note patient has IV placed, will give morphine , repeat dose of Zofran , and fluids given some evidence of mild dehydration. Labs are largely markable.  Mild leukocytosis possibly reactive.  X-ray and CT imaging most notable for a recent T12 compression fracture with 35% height loss.  Order placed for TLSO brace. Patient with TLSO brace placed. Repeated dose of morphine . Mild improvement. Questionable if patient will be able to safely discharge home. 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 No other help at home at  this time. Patient with some improvement with movement with combination of TLSO and walker. Notable pain when attempting to sit down once again. Given patient's age, home support, and poor ability to ambulate, will consult with hospitalist for admission for observation to attempt to get pain better controlled for patient. Spoke with Dr. Silvester, hospitalist, who will evaluate patient for admission.   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

## 2023-08-04 NOTE — Progress Notes (Signed)
 Orthopedic Tech Progress Note Patient Details:  Jessica [name] 1929/10/13 [id number]  Ortho Devices Type of Ortho Device: Thoracolumbar corset (TLSO) Ortho Device/Splint Location: BACK Ortho Device/Splint Interventions: Ordered, Application, Adjustment   Post Interventions Patient Tolerated: Fair Instructions Provided: Care of device  Delanna LITTIE Pac 08/04/2023, 5:05 PM

## 2023-08-04 NOTE — Assessment & Plan Note (Signed)
-   Order Sensitive  SSI     -  check TSH and HgA1C  - Hold by mouth medications*  

## 2023-08-04 NOTE — Assessment & Plan Note (Signed)
 Chronic stable Obtain anemia panel  Transfuse for Hg <7 , rapidly dropping or  if symptomatic

## 2023-08-04 NOTE — ED Notes (Signed)
Pt ambulatory with walker.

## 2023-08-04 NOTE — ED Notes (Signed)
 Assumed pt care.

## 2023-08-04 NOTE — ED Provider Triage Note (Signed)
 Emergency Medicine Provider Triage Evaluation Note  Jessica [name] , a 89 y.o. female  was evaluated in triage.  Pt complains of back pain.  On Thursday she states her right knee gave out and she fell.  She landed on her back and hit her head.  Did not lose consciousness.  She has no headache or neck pain or thoracic back pain.  She has pain in her mid lumbar spine.  She also states she has not had a bowel movement in several days.  She feels somewhat back up and has some abdominal distention.  She has had some nausea and vomiting as well.  She has no weakness or numbness.  Mild pain to her right knee.  No urinary difficulties or bowel or bladder changes otherwise.  Review of Systems  Positive: Back pain, abdominal distention, vomiting Negative: Weakness, numbness  Physical Exam  BP (!) 168/80 (BP Location: Right Arm)   Pulse 81   Temp 98.1 F (36.7 C)   Resp 16   Wt 75 kg   SpO2 97%   BMI 29.29 kg/m  Gen:   Awake, no distress   Resp:  Normal effort  MSK:   Moves extremities without difficulty, strength and sensation and distal pulses intact, mild tenderness to the mid lumbar spine.  Mild abdominal distention.  Medical Decision Making  Medically screening exam initiated at 10:26 AM.  Appropriate orders placed.  Icess J Stell was informed that the remainder of the evaluation will be completed by another provider, this initial triage assessment does not replace that evaluation, and the importance of remaining in the ED until their evaluation is complete.  Patient here with mechanical fall with back pain.  She also has had some constipation and abdominal distention with vomiting.  Regarding the back pain I think she needs a CT of her lumbar spine, she is neuro intact no signs of cauda equina syndrome.  She has mild abdominal distention and vomiting with recent constipation.  I have lower suspicion for this being a neurologic cause, will evaluate with CT of the abdomen to rule out  obstruction given vomiting and constipation.  She also did hit her head as well.  No evidence of C or T-spine trauma.   Nicholaus Cassondra DEL, MD 08/04/23 1028

## 2023-08-04 NOTE — Assessment & Plan Note (Signed)
Secondary to T12 compression fracture Pain management TLsO brace may need kyphoplasty if not controlled Will need to have follow-up with spine specialty

## 2023-08-04 NOTE — Assessment & Plan Note (Signed)
Will rehydrate 

## 2023-08-04 NOTE — ED Triage Notes (Signed)
 Presents from home via [facility] for back pain since fall last week. Was seen at [facility] yesterday, received unknown injection and nausea medication. Today in ER because pain is now unbearable.  EMS VS: 146/72, 98HR, 98%RA, 16RR.  No meds en route, able to stand and pivot in triage.

## 2023-08-04 NOTE — Assessment & Plan Note (Signed)
 Bowel regimen

## 2023-08-04 NOTE — Assessment & Plan Note (Signed)
-  chronic avoid nephrotoxic medications such as NSAIDs, Vanco Zosyn combo,  avoid hypotension, continue to follow renal function

## 2023-08-05 ENCOUNTER — Observation Stay (HOSPITAL_COMMUNITY): Payer: Medicare Other

## 2023-08-05 DIAGNOSIS — R29890 Loss of height: Secondary | ICD-10-CM | POA: Diagnosis not present

## 2023-08-05 DIAGNOSIS — M4854XA Collapsed vertebra, not elsewhere classified, thoracic region, initial encounter for fracture: Secondary | ICD-10-CM | POA: Diagnosis not present

## 2023-08-05 DIAGNOSIS — M47814 Spondylosis without myelopathy or radiculopathy, thoracic region: Secondary | ICD-10-CM | POA: Diagnosis not present

## 2023-08-05 DIAGNOSIS — S22080A Wedge compression fracture of T11-T12 vertebra, initial encounter for closed fracture: Secondary | ICD-10-CM | POA: Diagnosis not present

## 2023-08-05 DIAGNOSIS — M545 Low back pain, unspecified: Secondary | ICD-10-CM | POA: Diagnosis not present

## 2023-08-05 DIAGNOSIS — K5900 Constipation, unspecified: Secondary | ICD-10-CM | POA: Diagnosis not present

## 2023-08-05 DIAGNOSIS — K449 Diaphragmatic hernia without obstruction or gangrene: Secondary | ICD-10-CM | POA: Diagnosis not present

## 2023-08-05 DIAGNOSIS — Z862 Personal history of diseases of the blood and blood-forming organs and certain disorders involving the immune mechanism: Secondary | ICD-10-CM | POA: Diagnosis not present

## 2023-08-05 LAB — COMPREHENSIVE METABOLIC PANEL
ALT: 14 U/L (ref 0–44)
AST: 19 U/L (ref 15–41)
Albumin: 3.2 g/dL — ABNORMAL LOW (ref 3.5–5.0)
Alkaline Phosphatase: 58 U/L (ref 38–126)
Anion gap: 12 (ref 5–15)
BUN: 20 mg/dL (ref 8–23)
CO2: 24 mmol/L (ref 22–32)
Calcium: 8.9 mg/dL (ref 8.9–10.3)
Chloride: 97 mmol/L — ABNORMAL LOW (ref 98–111)
Creatinine, Ser: 1.01 mg/dL — ABNORMAL HIGH (ref 0.44–1.00)
GFR, Estimated: 53 mL/min — ABNORMAL LOW (ref >60)
Glucose, Bld: 103 mg/dL — ABNORMAL HIGH (ref 70–99)
Potassium: 3.3 mmol/L — ABNORMAL LOW (ref 3.5–5.1)
Sodium: 133 mmol/L — ABNORMAL LOW (ref 135–145)
Total Bilirubin: 0.7 mg/dL (ref 0.0–1.2)
Total Protein: 6.8 g/dL (ref 6.5–8.1)

## 2023-08-05 LAB — CBC
HCT: 33.7 % — ABNORMAL LOW (ref 36.0–46.0)
Hemoglobin: 11.3 g/dL — ABNORMAL LOW (ref 12.0–15.0)
MCH: 30.1 pg (ref 26.0–34.0)
MCHC: 33.5 g/dL (ref 30.0–36.0)
MCV: 89.9 fL (ref 80.0–100.0)
Platelets: 256 10*3/uL (ref 150–400)
RBC: 3.75 MIL/uL — ABNORMAL LOW (ref 3.87–5.11)
RDW: 14.6 % (ref 11.5–15.5)
WBC: 10.4 10*3/uL (ref 4.0–10.5)
nRBC: 0 % (ref 0.0–0.2)

## 2023-08-05 LAB — MAGNESIUM: Magnesium: 2.2 mg/dL (ref 1.7–2.4)

## 2023-08-05 LAB — GLUCOSE, CAPILLARY
Glucose-Capillary: 111 mg/dL — ABNORMAL HIGH (ref 70–99)
Glucose-Capillary: 111 mg/dL — ABNORMAL HIGH (ref 70–99)

## 2023-08-05 LAB — HEMOGLOBIN A1C
Hgb A1c MFr Bld: 6 % — ABNORMAL HIGH (ref 4.8–5.6)
Mean Plasma Glucose: 125.5 mg/dL

## 2023-08-05 LAB — PHOSPHORUS: Phosphorus: 4 mg/dL (ref 2.5–4.6)

## 2023-08-05 MED ORDER — ORAL CARE MOUTH RINSE: 15.0000 mL | OROMUCOSAL | Status: DC | PRN

## 2023-08-05 MED ORDER — POTASSIUM CHLORIDE CRYS ER 20 MEQ PO TBCR
40.0000 meq | EXTENDED_RELEASE_TABLET | Freq: Two times a day (BID) | ORAL | Status: AC
  Administered 2023-08-05 (×2): 40 meq via ORAL
  Filled 2023-08-05 (×2): qty 2

## 2023-08-05 MED ORDER — PRAVASTATIN SODIUM 40 MG PO TABS
20.0000 mg | ORAL_TABLET | Freq: Every day | ORAL | Status: DC
  Administered 2023-08-05 – 2023-08-11 (×7): 20 mg via ORAL
  Filled 2023-08-05 (×7): qty 1

## 2023-08-05 MED ORDER — PANTOPRAZOLE SODIUM 40 MG PO TBEC
40.0000 mg | DELAYED_RELEASE_TABLET | Freq: Every day | ORAL | Status: DC
  Administered 2023-08-05 – 2023-08-12 (×8): 40 mg via ORAL
  Filled 2023-08-05 (×8): qty 1

## 2023-08-05 MED ORDER — INSULIN ASPART 100 UNIT/ML IJ SOLN
0.0000 [IU] | Freq: Three times a day (TID) | INTRAMUSCULAR | Status: DC
Start: 2023-08-05 — End: 2023-08-07
  Administered 2023-08-07: 1 [IU] via SUBCUTANEOUS

## 2023-08-05 MED ORDER — METOPROLOL SUCCINATE ER 25 MG PO TB24
25.0000 mg | ORAL_TABLET | Freq: Every evening | ORAL | Status: DC
  Administered 2023-08-05 – 2023-08-11 (×7): 25 mg via ORAL
  Filled 2023-08-05 (×7): qty 1

## 2023-08-05 MED ORDER — AMLODIPINE BESYLATE 5 MG PO TABS
10.0000 mg | ORAL_TABLET | Freq: Every day | ORAL | Status: DC
Start: 2023-08-05 — End: 2023-08-12
  Administered 2023-08-05 – 2023-08-12 (×8): 10 mg via ORAL
  Filled 2023-08-05: qty 4
  Filled 2023-08-05 (×3): qty 2
  Filled 2023-08-05: qty 4
  Filled 2023-08-05 (×3): qty 2

## 2023-08-05 MED ORDER — INSULIN ASPART 100 UNIT/ML IJ SOLN: 0.0000 [IU] | Freq: Every day | INTRAMUSCULAR | Status: DC

## 2023-08-05 NOTE — TOC Initial Note (Signed)
 Transition of Care [facility]) - Initial/Assessment Note    Patient Details  Name: Jessica [name] MRN: [id number] Date of Birth: June 15, 1930  Transition of Care [facility]) CM/SW Contact:    Andrez JULIANNA George, RN Phone Number: 08/05/2023, 4:00 PM  Clinical Narrative:                  Pt is from home with her disabled daughter. Son has arranged caregivers for home.  Therapy arranged with [facility]. Information on the AVS and [facility] will contact her for the first home visit.  Family will provide needed transportation.  Pt manages her own medications and she denies any issues. TOC following.   Expected Discharge Plan: Home w Home Health Services Barriers to Discharge: Continued Medical Work up   Patient Goals and CMS Choice   CMS Medicare.gov Compare Post Acute Care list provided to:: Patient Choice offered to / list presented to : Patient, Adult Children      Expected Discharge Plan and Services   Discharge Planning Services: CM Consult Post Acute Care Choice: Home Health Living arrangements for the past 2 months: Single Family Home                           HH Arranged: PT, OT HH Agency: [facility] Date [facility] Agency Contacted: 08/05/23   Representative spoke with at [facility] Agency: Darleene  Prior Living Arrangements/Services Living arrangements for the past 2 months: Single Family Home Lives with:: Adult Children Patient language and need for interpreter reviewed:: Yes Do you feel safe going back to the place where you live?: Yes        Care giver support system in place?: Yes (comment) Current home services: DME (chair lift/ walker/ rollator/ BSC/ shower seat) Criminal Activity/Legal Involvement Pertinent to Current Situation/Hospitalization: No - Comment as needed  Activities of Daily Living   ADL Screening (condition at time of admission) Independently performs ADLs?: No Does the patient have a NEW difficulty with bathing/dressing/toileting/self-feeding that is  expected to last >3 days?: Yes (Initiates electronic notice to provider for possible OT consult) Does the patient have a NEW difficulty with getting in/out of bed, walking, or climbing stairs that is expected to last >3 days?: Yes (Initiates electronic notice to provider for possible PT consult) Does the patient have a NEW difficulty with communication that is expected to last >3 days?: No Is the patient deaf or have difficulty hearing?: Yes Does the patient have difficulty seeing, even when wearing glasses/contacts?: No Does the patient have difficulty concentrating, remembering, or making decisions?: No  Permission Sought/Granted                  Emotional Assessment Appearance:: Appears stated age Attitude/Demeanor/Rapport: Engaged Affect (typically observed): Accepting Orientation: : Oriented to Self, Oriented to Place, Oriented to  Time, Oriented to Situation   Psych Involvement: No (comment)  Admission diagnosis:  Dehydration [E86.0] T12 compression fracture (HCC) [S22.080A] Compression fracture of T12 vertebra, initial encounter (HCC) [S22.080A] Patient Active Problem List   Diagnosis Date Noted   T12 compression fracture (HCC) 08/04/2023   Hyponatremia 08/04/2023   Dehydration 08/04/2023   Type 2 diabetes mellitus without complication, without long-term current use of insulin  (HCC) 08/01/2020   Palpitations 11/13/2019   Stage 3 chronic kidney disease (HCC) 10/05/2019   History of iron deficiency anemia 02/16/2016   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 [place] [address] Phone: [phone] Fax: [phone]     Social Drivers of Health (SDOH) Social History: SDOH Screenings   Food Insecurity: No Food Insecurity (08/04/2023)  Housing: Patient Declined (08/04/2023)  Transportation Needs: No Transportation Needs (08/04/2023)  Utilities: Patient Declined (08/04/2023)  Depression (PHQ2-9): Low Risk  (03/03/2023)  Financial Resource Strain: Low Risk  (10/25/2022)  Physical Activity: Unknown (10/25/2022)  Social Connections: Socially Isolated (08/05/2023)  Stress: No Stress Concern Present (10/25/2022)  Tobacco Use: Medium Risk (08/04/2023)   SDOH Interventions:     Readmission Risk Interventions     No data to display

## 2023-08-05 NOTE — Plan of Care (Signed)

## 2023-08-05 NOTE — Progress Notes (Signed)
 PROGRESS NOTE    Jessica [name]  FMW:[id number] DOB: [birth year] DOA: 08/04/2023 PCP: Blush Bleacher, MD   Brief Narrative:  The patient is a 89 year old elderly Caucasian female with past medical history significant for Mann[place] to hypertension, osteoarthritis, constipation, hiatal hernia, CKD stage IIIa, diabetes mellitus type 2 and other comorbidities who presented to the hospital with severe back pain after a fall.  She had a fall last week and was seen in urgent care yesterday and given an injection of some nausea medication.  She had no neurological signs and no unilateral weakness or numbness or loss of bowel or bladder function.  Further workup was done and she was found of a recent T12 compression fracture with 35% height loss.  She is given oxycodone , Zofran  and morphine  and pain minimally improved.  She was placed in a TLSO brace and given her continued pain interventional radiology was consulted for further evaluation for kyphoplasty.  Assessment and Plan:  Back Pain -Secondary to T12 compression fracture -Pain management created and will continue acetaminophen  650 mg p.o./RC every 6 mild pain for pain score 1-3, methocarbamol  500 mg IV Q 6/h for muscle spasms, hydrocodone -acetaminophen  1-2 tabs p.o. every 4 as needed for moderate pain and IV fentanyl  12.5 to 50 mcg IV q. to p.o. for severe pain -WBC Trend: Recent Labs  Lab 08/04/23 1112 08/05/23 0553  WBC 13.5* 10.4  -TLSO brace  -Given continued Pain will consult IR to evaluate for Kyphoplasty and they are going to order an MRI for further evaluation -PT/OT recommending Home Health   T12 compression fracture (HCC) -Pain control as above -Will consult IR as above  Cystocele -Bowel regimen initiated and she is on docusate sodium  100 mg p.o. twice daily, bisacodyl  10 mg RC daily as needed for moderate constipation and MiraLAX  17 g daily as needed for mild constipation as well as senna 8.6 mg p.o. twice daily    HTN  (hypertension) -Continue Amlodipine  10 mg a day and resume home metoprolol  succinate 25 mg p.o. daily -Continue to hold hydrochlorothiazide  12.5 mg p.o. daily -Continue To monitor blood pressures per protocol -Last blood pressure reading was 129/67   Hyperlipidemia -Continue oral Pravastatin  20 mg a day   Osteoarthritis -Check vitamin D  level and was 44.73   Type 2 Diabetes Mellitus without complication, without long-term current use of insulin  (HCC) -Order Sensitive SSI AC/HS -Check TSH and HgA1C -Holding Oral Diabetic Medications  -CBG Trend: No results for input(s): GLUCAP in the last 720 hours. -Glucose Level Trend: Recent Labs  Lab 08/04/23 1112 08/04/23 2107 08/05/23 0553  GLUCOSE 124* 108* 103*    Dehydration -Given Gentle IVF Hydration with NS at 75 mL/hr and this has now stopped   Constipation -Bowel regimen initiated with Bisacodyl  10 mg RC Daily PRN Moderate Consitpation and Docusate Sodium  100 mg po BID and Senna 8.6 mg po BID and Miralax  17 g po Daily PRN   Hyponatremia -Na+ Trend: Recent Labs  Lab 08/04/23 1112 08/04/23 2107 08/05/23 0553  NA 130* 133* 133*  -Continue to Monitor and Trend and Repeat CMP in the AM  CKD Stage 3a -BUN/Cr Trend: Recent Labs  Lab 08/04/23 1112 08/04/23 2107 08/05/23 0553  BUN 23 22 20   CREATININE 1.10* 1.11* 1.01*  -Avoid Nephrotoxic Medications, Contrast Dyes, Hypotension and Dehydration to Ensure Adequate Renal Perfusion and will need to Renally Adjust Meds -Continue to Monitor and Trend Renal Function carefully and repeat CMP in the AM   Hypokalemia -Patient's K+ Level Trend:  Recent Labs  Lab 08/04/23 1112 08/04/23 2107 08/05/23 0553  K 3.9 3.6 3.3*  -Replete with po Kcl 40 mEQ BID x2 -Continue to Monitor and Replete as Necessary -Repeat CMP in the AM   Normocytic Anemia/Hx of IDA -Hgb/Hct Trend: Recent Labs  Lab 08/04/23 1112 08/05/23 0553  HGB 13.3 11.3*  HCT 39.5 33.7*  MCV 88.8 89.9  -Check  Anemia Panel in the AM; Transfuse for Hg <7 , rapidly dropping or  if symptomatic -Continue to Monitor for S/Sx of Bleeding; No overt bleeding noted -Repeat CBC in the AM  Right Knee Pain -Right knee DG done and showed  Severe patellofemoral osteoarthritis. Tiny joint effusion. No definitive acute fracture line is seen. -With orthopedic surgery for possible cortisone injection as patient is requesting this  Hypoalbuminemia -Patient's Albumin Trend: Recent Labs  Lab 08/04/23 1112 08/05/23 0553  ALBUMIN 4.1 3.2*  -Continue to Monitor and Trend and repeat CMP in the AM  Overweight -Complicates overall prognosis and care -Estimated body mass index is 29.29 kg/m as calculated from the following:   Height as of this encounter: 5' 3 (1.6 m).   Weight as of this encounter: 75 kg.  -Weight Loss and Dietary Counseling given   DVT prophylaxis: SCDs Start: 08/04/23 2130    Code Status: Full Code Family Communication: Discussed with the son at bedside  Disposition Plan:  Level of care: Telemetry Medical Status is: Observation The patient will require care spanning > 2 midnights and should be moved to inpatient because: Clinical evaluation improvement and intervention radiology to decide about kyphoplasty pending MRI   Consultants:  Interventional radiology  Procedures:  As delineated as above  Antimicrobials:  Anti-infectives (From admission, onward)    None       Subjective: Seen and examined at bedside was still having quite a bit of back pain.  No nausea or vomiting.  Feels okay but also complained of some knee pain and asking for orthopedic surgery can come by  Objective: Vitals:   08/04/23 2344 08/05/23 0410 08/05/23 0819 08/05/23 1205  BP: (!) 144/68 (!) 108/51 (!) 148/55 129/67  Pulse: 92 76 81 90  Resp: 18 16 20 18   Temp: 98.2 F (36.8 C) 97.9 F (36.6 C) 98.1 F (36.7 C) 98.9 F (37.2 C)  TempSrc: Oral Oral Oral Oral  SpO2:  92% 91% 96%  Weight: 75 kg      Height: 5' 3 (1.6 m)       Intake/Output Summary (Last 24 hours) at 08/05/2023 1438 Last data filed at 08/05/2023 0900 Gross per 24 hour  Intake 240 ml  Output 500 ml  Net -260 ml   Filed Weights   08/04/23 1009 08/04/23 2344  Weight: 75 kg 75 kg   Examination: Physical Exam:  Constitutional: WN/WD overweight Caucasian female who appears a little uncomfortable Respiratory: Diminished to auscultation bilaterally, no wheezing, rales, rhonchi or crackles. Normal respiratory effort and patient is not tachypenic. No accessory muscle use.  Unlabored breathing Cardiovascular: RRR, no murmurs / rubs / gallops. S1 and S2 auscultated.  Mild extremity edema.  Abdomen: Soft, non-tender, distended secondary to body habitus. Bowel sounds positive.  GU: Deferred. Musculoskeletal: No clubbing / cyanosis of digits/nails. No joint deformity upper and lower extremities.  Wearing TLSO brace Skin: No rashes, lesions, ulcers on limited skin evaluation. No induration; Warm and dry.  Neurologic: CN 2-12 grossly intact with no focal deficits. Romberg sign and cerebellar reflexes not assessed.  Psychiatric: Normal judgment and insight. Alert  and oriented x 3. Normal mood and appropriate affect.   Data Reviewed: I have personally reviewed following labs and imaging studies  CBC: Recent Labs  Lab 08/04/23 1112 08/05/23 0553  WBC 13.5* 10.4  NEUTROABS 9.4*  --   HGB 13.3 11.3*  HCT 39.5 33.7*  MCV 88.8 89.9  PLT 296 256   Basic Metabolic Panel: Recent Labs  Lab 08/04/23 1112 08/04/23 2107 08/05/23 0553  NA 130* 133* 133*  K 3.9 3.6 3.3*  CL 92* 97* 97*  CO2 24 22 24   GLUCOSE 124* 108* 103*  BUN 23 22 20   CREATININE 1.10* 1.11* 1.01*  CALCIUM  9.5 9.0 8.9  MG  --  2.3 2.2  PHOS  --  3.2 4.0   GFR: Estimated Creatinine Clearance: 35.2 mL/min (A) (by C-G formula based on SCr of 1.01 mg/dL (H)). Liver Function Tests: Recent Labs  Lab 08/04/23 1112 08/05/23 0553  AST 30 19  ALT 17 14   ALKPHOS 74 58  BILITOT 0.8 0.7  PROT 8.4* 6.8  ALBUMIN 4.1 3.2*   No results for input(s): LIPASE, AMYLASE in the last 168 hours. No results for input(s): AMMONIA in the last 168 hours. Coagulation Profile: No results for input(s): INR, PROTIME in the last 168 hours. Cardiac Enzymes: Recent Labs  Lab 08/04/23 2107  CKTOTAL 60   BNP (last 3 results) No results for input(s): PROBNP in the last 8760 hours. HbA1C: No results for input(s): HGBA1C in the last 72 hours. CBG: No results for input(s): GLUCAP in the last 168 hours. Lipid Profile: No results for input(s): CHOL, HDL, LDLCALC, TRIG, CHOLHDL, LDLDIRECT in the last 72 hours. Thyroid  Function Tests: Recent Labs    08/04/23 2107  TSH 2.013   Anemia Panel: No results for input(s): VITAMINB12, FOLATE, FERRITIN, TIBC, IRON, RETICCTPCT in the last 72 hours. Sepsis Labs: No results for input(s): PROCALCITON, LATICACIDVEN in the last 168 hours.  No results found for this or any previous visit (from the past 240 hours).   Radiology Studies: CT L-SPINE NO CHARGE Result Date: 08/04/2023 CLINICAL DATA:  Back pain since a fall last week. EXAM: CT LUMBAR SPINE WITH CONTRAST TECHNIQUE: Technique: Multiplanar CT images of the lumbar spine were reconstructed from contemporary CT of the Abdomen and Pelvis. RADIATION DOSE REDUCTION: This exam was performed according to the departmental dose-optimization program which includes automated exposure control, adjustment of the mA and/or kV according to patient size and/or use of iterative reconstruction technique. CONTRAST:  No additional. COMPARISON:  Lumbar spine MRI 02/02/2006 FINDINGS: Segmentation: 5 lumbar type vertebrae. Alignment: Mild lumbar levoscoliosis. Trace retrolisthesis of L3 on L4. Vertebrae: T12 compression fracture with 35% vertebral body height loss centrally and recent appearance with visible fracture line. No significant retropulsion  or posterior element fracture. Diffuse osteopenia. No destructive bone lesion. Paraspinal and other soft tissues: Paravertebral soft tissue edema at T12. Remainder of the abdomen and pelvis reported separately. Disc levels: Multilevel disc degeneration, most advanced on the right at L3-4 and L4-5. Mild right greater than left neural foraminal stenosis at L3-4 and mild-to-moderate right neural foraminal stenosis at L4-5. No evidence of high-grade spinal canal stenosis. IMPRESSION: Recent T12 compression fracture with 35% height loss. Electronically Signed   By: Dasie Hamburg M.D.   On: 08/04/2023 15:29   CT Head Wo Contrast Result Date: 08/04/2023 CLINICAL DATA:  Head trauma, minor (Age >= 65y).  Recent fall. EXAM: CT HEAD WITHOUT CONTRAST TECHNIQUE: Contiguous axial images were obtained from the base of the  skull through the vertex without intravenous contrast. RADIATION DOSE REDUCTION: This exam was performed according to the departmental dose-optimization program which includes automated exposure control, adjustment of the mA and/or kV according to patient size and/or use of iterative reconstruction technique. COMPARISON:  None Available. FINDINGS: Brain: There is no evidence of an acute infarct, intracranial hemorrhage, mass, or midline shift. Cerebral white matter hypodensities are nonspecific but compatible with mild chronic small vessel ischemic disease. There is mild cerebral atrophy. Slightly asymmetrically prominent extra-axial CSF over the left frontal convexity is without significant mass effect and is favored to be secondary to atrophy, although a very small subdural hygroma is not excluded. Vascular: Calcified atherosclerosis at the skull base. No hyperdense vessel. Skull: No acute fracture or suspicious osseous lesion. Sinuses/Orbits: Right sphenoid sinus mucous retention cyst. Clear mastoid air cells. Bilateral cataract extraction. Other: None. IMPRESSION: 1. No evidence of acute intracranial  abnormality. 2. Mild chronic small vessel ischemic disease and cerebral atrophy. Electronically Signed   By: Dasie Hamburg M.D.   On: 08/04/2023 15:22   CT ABDOMEN PELVIS W CONTRAST Result Date: 08/04/2023 CLINICAL DATA:  Bowel obstruction suspected Abdominal pain, acute, nonlocalized EXAM: CT ABDOMEN AND PELVIS WITH CONTRAST TECHNIQUE: Multidetector CT imaging of the abdomen and pelvis was performed using the standard protocol following bolus administration of intravenous contrast. RADIATION DOSE REDUCTION: This exam was performed according to the departmental dose-optimization program which includes automated exposure control, adjustment of the mA and/or kV according to patient size and/or use of iterative reconstruction technique. CONTRAST:  75mL OMNIPAQUE  IOHEXOL  350 MG/ML SOLN COMPARISON:  None Available. FINDINGS: Lower chest: Moderate hiatal hernia. No pleural or pericardial effusion. Mild dependent atelectasis in the lung bases. Hepatobiliary: Gallbladder physiologically distended without calcified stones. Ectatic CBD 1.2 cm diameter. Minimal central intrahepatic biliary ductal ectasia. No focal liver lesion. Pancreas: Unremarkable. No pancreatic ductal dilatation or surrounding inflammatory changes. Spleen: Normal in size. Coarse subcapsular calcifications. No acute lesion. Adrenals/Urinary Tract: No adrenal mass. No urolithiasis. No hydronephrosis. 3 cm 9 HU probable cyst, left lower pole; no follow-up warranted. Urinary bladder is distended. Stomach/Bowel: Moderate hiatal hernia. Stomach is nondilated. Small bowel decompressed. Appendix not identified. The colon is partially distended, with scattered sigmoid diverticula; no adjacent inflammatory change. Vascular/Lymphatic: Moderate calcified aortoiliac plaque without aneurysm. Portal vein patent. No abdominal or pelvic adenopathy. Reproductive: Status post hysterectomy. No adnexal masses. Other: No ascites.  No free air. Musculoskeletal: Mild T12  vertebral compression deformity without significant retropulsion. Multilevel spondylitic change in the lumbar spine. IMPRESSION: 1. No acute findings. 2. Moderate hiatal hernia. 3. Sigmoid diverticulosis. Electronically Signed   By: JONETTA Faes M.D.   On: 08/04/2023 15:16   DG Knee Complete 4 Views Right Result Date: 08/04/2023 CLINICAL DATA:  Fall.  Right knee pain. EXAM: RIGHT KNEE - COMPLETE 4+ VIEW COMPARISON:  None Available. FINDINGS: There is diffuse decreased bone mineralization. Severe patellofemoral joint space narrowing with diffuse bone-on-bone contact, subchondral sclerosis, and moderate peripheral osteophytosis. Tiny joint effusion. Mild medial and lateral compartment chondrocalcinosis without significant joint space narrowing. No definitive acute fracture line is seen. Mild atherosclerotic calcifications. IMPRESSION: 1. Severe patellofemoral osteoarthritis. 2. Tiny joint effusion. 3. No definitive acute fracture line is seen. Electronically Signed   By: Tanda Lyons M.D.   On: 08/04/2023 12:18   Scheduled Meds:  amLODipine   10 mg Oral Daily   docusate sodium   100 mg Oral BID   insulin  aspart  0-5 Units Subcutaneous QHS   insulin  aspart  0-9 Units  Subcutaneous TID WC   metoprolol  succinate  25 mg Oral QPM   pantoprazole   40 mg Oral Daily   potassium chloride   40 mEq Oral BID   pravastatin   20 mg Oral q1800   senna  1 tablet Oral BID   Continuous Infusions:   LOS: 0 days   Alejandro Marker, DO [facility] Available via Epic secure chat 7am-7pm After these hours, please refer to coverage provider listed on [email or web address] 08/05/2023, 2:38 PM

## 2023-08-05 NOTE — Hospital Course (Addendum)
 89yo with h/o HTN, stage 3a CKD, and DM who presented on 2/4 with severe back pain after a fall a week prior.  She was found to have a T12 compression fracture with 35% height loss.  She had minimally improvement with pain control and a TLSO brace.  IR was consulted and she underwent kyphoplasty on 08/09/22.  Hospitalization has been complicated by constipation and persistent/worsening leukocytosis.

## 2023-08-05 NOTE — Care Management Obs Status (Signed)
 MEDICARE OBSERVATION STATUS NOTIFICATION   Patient Details  Name: Jessica [name] MRN: [id number] Date of Birth: 1929-11-20   Medicare Observation Status Notification Given:  Yes    Omie Bickers, RN 08/05/2023, 10:08 AM

## 2023-08-05 NOTE — Plan of Care (Signed)

## 2023-08-05 NOTE — Telephone Encounter (Unsigned)
 Copied from CRM [id number]. Topic: Clinical - Medical Advice >> Aug 05, 2023  8:40 AM Bascom RAMAN wrote: Reason for CRM: Jessica [name], daughter, patient fell and being released from [facility] today. Patient has a fractured vertebrae. Needs assistance with a home aide to assist with mobility on an emergency basis. Patient is not being sent to a rehab facility. Callback number is [phone]

## 2023-08-05 NOTE — Evaluation (Signed)
 Physical Therapy Evaluation Patient Details Name: Jessica [name] MRN: [id number] DOB: 09-25-1929 Today's Date: 08/05/2023  History of Present Illness  89 y.o. female presents to [facility] hospital on 08/04/2023 with severe back pain after recent fall. Pt found to have T12 compression fx. PMH includes HTN, OA, hiatal hernia, CKD, DMII.  Clinical Impression  Pt presents to PT with deficits in functional mobility, gait, balance, endurance, power. Pt is limited by back pain along with R knee OA at this time. Pt reports R knee OA was a main contributor in her fall resulting in T12 fx, as her R knee buckled before the fall. PT provides education on back precautions and brace use. Pt requires assistance for bed mobility and demonstrates poor tolerance for ambulation currently. Pt does have a lift chair at home, and may benefit from sleeping in this recliner to avoid physical assistance needs in bed if caregiver support is limited. Pt's son reports they are meeting with a home health agency today in an effort to obtain as much aide services as possible prior to the pt returning home. PT recommends the pt limit mobility to stand step transfers with use of RW if no caregiver support is present due to high risk for falls with continued back pain and knee instability. Pt will benefit from HHPT at the time of discharge.      If plan is discharge home, recommend the following: A little help with walking and/or transfers;A lot of help with bathing/dressing/bathroom;Assistance with cooking/housework;Assist for transportation;Help with stairs or ramp for entrance   Can travel by private vehicle        Equipment Recommendations None recommended by PT  Recommendations for Other Services       Functional Status Assessment Patient has had a recent decline in their functional status and demonstrates the ability to make significant improvements in function in a reasonable and predictable amount of time.     Precautions /  Restrictions Precautions Precautions: Fall;Back Precaution Booklet Issued: No Precaution Comments: verbally reviewed back precautions Required Braces or Orthoses: Spinal Brace Spinal Brace: Thoracolumbosacral orthotic;Applied in sitting position Restrictions Weight Bearing Restrictions Per Provider Order: No      Mobility  Bed Mobility Overal bed mobility: Needs Assistance Bed Mobility: Rolling, Sidelying to Sit Rolling: Min assist Sidelying to sit: Min assist       General bed mobility comments: verbal cues for technique, assist via hand hold to roll, assist at trunk in sidelying to sit    Transfers Overall transfer level: Needs assistance Equipment used: Rolling walker (2 wheels) Transfers: Sit to/from Stand Sit to Stand: Contact guard assist, From elevated surface           General transfer comment: pt unable to stand with initial attempt from low bed height. Pt is successful with bed raised ~2 inches. Pt has a lift chair at home    Ambulation/Gait Ambulation/Gait assistance: Contact guard assist Gait Distance (Feet): 15 Feet Assistive device: Rolling walker (2 wheels) Gait Pattern/deviations: Step-to pattern Gait velocity: reduced Gait velocity interpretation: <1.31 ft/sec, indicative of household ambulator   General Gait Details: slowed step-to gait, reduced step length bilaterally  Stairs            Wheelchair Mobility     Tilt Bed    Modified Rankin (Stroke Patients Only)       Balance Overall balance assessment: Needs assistance Sitting-balance support: Feet supported, Single extremity supported Sitting balance-Leahy Scale: Poor Sitting balance - Comments: posterior lean supported by UE  Postural control: Posterior lean Standing balance support: Bilateral upper extremity supported Standing balance-Leahy Scale: Poor                               Pertinent Vitals/Pain Pain Assessment Pain Assessment: 0-10 Pain Score:  10-Worst pain ever Pain Location: back Pain Descriptors / Indicators: Aching Pain Intervention(s): Limited activity within patient's tolerance (pt reports 10/10 back pain pre-mobility, initially requests pain meds but then asks to attempt mobility without them. RN provides pain meds at end of session,)    Home Living Family/patient expects to be discharged to:: Private residence Living Arrangements: Children Available Help at Discharge: Family;Available 24 hours/day (daughter is disabled per the patient, cannot provide physical assist for mobility) Type of Home: House Home Access: Ramped entrance       Home Layout: One level Home Equipment: Agricultural Consultant (2 wheels);Rollator (4 wheels);Cane - single point;Wheelchair - manual;BSC/3in1;Shower seat;Lift chair      Prior Function Prior Level of Function : Independent/Modified Independent             Mobility Comments: ambulatory with RW in the home and a rollator in the community, issues with R knee OA. ADLs Comments: assist for driving, pt and daughter share household IADL responsibilities     Extremity/Trunk Assessment   Upper Extremity Assessment Upper Extremity Assessment: Generalized weakness    Lower Extremity Assessment Lower Extremity Assessment: Generalized weakness    Cervical / Trunk Assessment Cervical / Trunk Assessment: Other exceptions Cervical / Trunk Exceptions: TLSO due to T12 fx  Communication   Communication Communication: Hearing impairment Cueing Techniques: Verbal cues  Cognition Arousal: Alert Behavior During Therapy: WFL for tasks assessed/performed Overall Cognitive Status: Within Functional Limits for tasks assessed                                          General Comments General comments (skin integrity, edema, etc.): tachycardic into 120s with mobility    Exercises     Assessment/Plan    PT Assessment Patient needs continued PT services  PT Problem List Decreased  strength;Decreased activity tolerance;Decreased balance;Decreased mobility;Decreased knowledge of use of DME;Pain       PT Treatment Interventions DME instruction;Gait training;Functional mobility training;Therapeutic activities;Therapeutic exercise;Balance training;Neuromuscular re-education;Patient/family education;Wheelchair mobility training    PT Goals (Current goals can be found in the Care Plan section)  Acute Rehab PT Goals Patient Stated Goal: to reduce pain, improve ambulation PT Goal Formulation: With patient/family Time For Goal Achievement: 08/19/23 Potential to Achieve Goals: Fair    Frequency Min 1X/week     Co-evaluation               AM-PAC PT 6 Clicks Mobility  Outcome Measure Help needed turning from your back to your side while in a flat bed without using bedrails?: A Little Help needed moving from lying on your back to sitting on the side of a flat bed without using bedrails?: A Little Help needed moving to and from a bed to a chair (including a wheelchair)?: A Little Help needed standing up from a chair using your arms (e.g., wheelchair or bedside chair)?: A Little Help needed to walk in hospital room?: A Little Help needed climbing 3-5 steps with a railing? : Total 6 Click Score: 16    End of Session Equipment Utilized During Treatment: Gait belt;Back  brace Activity Tolerance: Patient limited by pain Patient left: in chair;with call bell/phone within reach;with chair alarm set;with family/visitor present Nurse Communication: Mobility status PT Visit Diagnosis: Other abnormalities of gait and mobility (R26.89);Muscle weakness (generalized) (M62.81);Pain Pain - part of body:  (back)    Time: 9085-9054 PT Time Calculation (min) (ACUTE ONLY): 31 min   Charges:   PT Evaluation $PT Eval Low Complexity: 1 Low   PT General Charges $$ ACUTE PT VISIT: 1 Visit         Bernardino JINNY Ruth, PT, DPT Acute Rehabilitation Office [phone]   Bernardino JINNY Ruth 08/05/2023, 10:03 AM

## 2023-08-05 NOTE — Evaluation (Signed)
 Occupational Therapy Evaluation Patient Details Name: Jessica [name] MRN: [id number] DOB: 02-09-1930 Today's Date: 08/05/2023   History of Present Illness 89 y.o. female presents to [facility] hospital on 08/04/2023 with severe back pain after recent fall. Pt found to have T12 compression fx. PMH includes HTN, OA, hiatal hernia, CKD, DMII.   Clinical Impression   Jessica [name] was evaluated s/p the above admission list. She lives at home with her daughter (who is unable to provide physical assist) and is indep/mod I for all ADL/IADLs at baseline. Upon evaluation the pt was limited by back and knee pain, generalized weakness, limited activity tolerance and knowledge of spinal precautions/compensatory techniques. Overall she needed min A to stand from the recliner with the RW and was superivsion A for mobility and bathroom transfers. Due to the deficits listed below the pt also needs up to mod A for LB ADLs due to pain and set up A for UB ADLs in sitting. Pt will benefit from continued acute OT services and HHOT.        If plan is discharge home, recommend the following: A little help with walking and/or transfers;A lot of help with bathing/dressing/bathroom;Assistance with cooking/housework;Assist for transportation;Help with stairs or ramp for entrance    Functional Status Assessment  Patient has had a recent decline in their functional status and demonstrates the ability to make significant improvements in function in a reasonable and predictable amount of time.  Equipment Recommendations  None recommended by OT       Precautions / Restrictions Precautions Precautions: Fall;Back Precaution Booklet Issued: No Precaution Comments: verbally reviewed back precautions Required Braces or Orthoses: Spinal Brace Spinal Brace: Thoracolumbosacral orthotic;Applied in sitting position Restrictions Weight Bearing Restrictions Per Provider Order: No      Mobility Bed Mobility Overal bed mobility: Needs  Assistance             General bed mobility comments: OOB upon arrival    Transfers Overall transfer level: Needs assistance Equipment used: Rolling walker (2 wheels) Transfers: Sit to/from Stand Sit to Stand: From elevated surface, Min assist           General transfer comment: min A to power up from low recliner, supervision A to stand from elevated toilet      Balance Overall balance assessment: Needs assistance Sitting-balance support: Feet supported, Single extremity supported Sitting balance-Leahy Scale: Fair     Standing balance support: Single extremity supported, During functional activity Standing balance-Leahy Scale: Fair Standing balance comment: statically groomed at the sink                           ADL either performed or assessed with clinical judgement   ADL Overall ADL's : Needs assistance/impaired Eating/Feeding: Independent   Grooming: Supervision/safety;Standing   Upper Body Bathing: Set up;Sitting   Lower Body Bathing: Moderate assistance;Sit to/from stand   Upper Body Dressing : Set up;Sitting Upper Body Dressing Details (indicate cue type and reason): total A for TLSO Lower Body Dressing: Moderate assistance;Sit to/from stand   Toilet Transfer: Minimal Dentist Details (indicate cue type and reason): minA for STS, supervision once ambulating Toileting- Clothing Manipulation and Hygiene: Supervision/safety;Sitting/lateral lean       Functional mobility during ADLs: Minimal assistance;Rolling walker (2 wheels) General ADL Comments: limited by back adn knee pain, decreased activity tolerance and generalized weakness     Vision Baseline Vision/History: 0 No visual deficits Vision Assessment?: No apparent visual deficits  Perception Perception: Not tested       Praxis Praxis: Not tested       Pertinent Vitals/Pain Pain Assessment Pain Assessment: Faces Faces Pain Scale: Hurts little more Pain  Location: knee and back Pain Descriptors / Indicators: Aching Pain Intervention(s): Limited activity within patient's tolerance, Monitored during session     Extremity/Trunk Assessment Upper Extremity Assessment Upper Extremity Assessment: Generalized weakness   Lower Extremity Assessment Lower Extremity Assessment: Defer to PT evaluation   Cervical / Trunk Assessment Cervical / Trunk Assessment: Other exceptions Cervical / Trunk Exceptions: TLSO due to T12 fx   Communication     Cognition Arousal: Alert Behavior During Therapy: WFL for tasks assessed/performed Overall Cognitive Status: Within Functional Limits for tasks assessed                                 General Comments: Great insight, HOH     General Comments  VSS, pain well managed this session     Home Living Family/patient expects to be discharged to:: Private residence Living Arrangements: Children Available Help at Discharge: Family;Available 24 hours/day Type of Home: House Home Access: Ramped entrance     Home Layout: One level     Bathroom Shower/Tub: Chief Strategy Officer: Handicapped height     Home Equipment: Agricultural Consultant (2 wheels);Rollator (4 wheels);Cane - single point;BSC/3in1;Shower seat;Toilet riser;Lift chair;Wheelchair - manual          Prior Functioning/Environment Prior Level of Function : Independent/Modified Independent             Mobility Comments: ambulatory with RW in the home and a rollator in the community, issues with R knee OA. ADLs Comments: assist for driving, pt and daughter share household IADL responsibilities        OT Problem List: Decreased strength;Decreased activity tolerance;Decreased range of motion;Impaired balance (sitting and/or standing);Decreased cognition;Decreased safety awareness;Decreased knowledge of use of DME or AE;Decreased knowledge of precautions;Pain      OT Treatment/Interventions: Self-care/ADL  training;Therapeutic exercise;DME and/or AE instruction;Therapeutic activities;Patient/family education;Balance training    OT Goals(Current goals can be found in the care plan section) Acute Rehab OT Goals Patient Stated Goal: less pain OT Goal Formulation: With patient Time For Goal Achievement: 08/19/23 Potential to Achieve Goals: Good ADL Goals Pt Will Perform Lower Body Dressing: with modified independence;sit to/from stand;with adaptive equipment Pt Will Transfer to Toilet: with modified independence;ambulating Additional ADL Goal #1: Pt will tolerate at least 10 minutes of OOB functional activity to demonstrate improved tolerance for ADLs at home Additional ADL Goal #2: Pt will don/doff TLSO with minimal verbal cues only  OT Frequency: Min 1X/week       AM-PAC OT 6 Clicks Daily Activity     Outcome Measure Help from another person eating meals?: None Help from another person taking care of personal grooming?: A Little Help from another person toileting, which includes using toliet, bedpan, or urinal?: A Little Help from another person bathing (including washing, rinsing, drying)?: A Lot Help from another person to put on and taking off regular upper body clothing?: A Little Help from another person to put on and taking off regular lower body clothing?: A Lot 6 Click Score: 17   End of Session Equipment Utilized During Treatment: Back brace Nurse Communication: Mobility status  Activity Tolerance: Patient tolerated treatment well Patient left: in chair;with call bell/phone within reach;with family/visitor present  OT Visit Diagnosis: Unsteadiness on  feet (R26.81);Other abnormalities of gait and mobility (R26.89);Muscle weakness (generalized) (M62.81);History of falling (Z91.81);Pain                Time: 1129-1202 OT Time Calculation (min): 33 min Charges:  OT General Charges $OT Visit: 1 Visit OT Evaluation $OT Eval Moderate Complexity: 1 Mod OT Treatments $Self  Care/Home Management : 8-22 mins  Jessica [name], OTR/L Acute Rehabilitation Services Office [phone] Secure Chat Communication Preferred   Jessica [name] 08/05/2023, 2:59 PM

## 2023-08-05 NOTE — Progress Notes (Signed)
 Transition of Care [facility]) - CAGE-AID Screening   Patient Details  Name: Jessica [name] MRN: [id number] Date of Birth: 04/13/1930  [facility]) CM/SW Contact:    Sallyanne MALVA Mettle, RN Phone Number: 08/05/2023, 6:31 AM  CAGE-AID Screening:    Have You Ever Felt You Ought to Cut Down on Your Drinking or Drug Use?: No Have People Annoyed You By Critizing Your Drinking Or Drug Use?: No Have You Felt Bad Or Guilty About Your Drinking Or Drug Use?: No Have You Ever Had a Drink or Used Drugs First Thing In The Morning to Steady Your Nerves or to Get Rid of a Hangover?: No CAGE-AID Score: 0  Substance Abuse Education Offered: No

## 2023-08-06 DIAGNOSIS — S22080A Wedge compression fracture of T11-T12 vertebra, initial encounter for closed fracture: Secondary | ICD-10-CM | POA: Diagnosis not present

## 2023-08-06 DIAGNOSIS — M545 Low back pain, unspecified: Secondary | ICD-10-CM | POA: Diagnosis not present

## 2023-08-06 DIAGNOSIS — Z862 Personal history of diseases of the blood and blood-forming organs and certain disorders involving the immune mechanism: Secondary | ICD-10-CM | POA: Diagnosis not present

## 2023-08-06 DIAGNOSIS — K5900 Constipation, unspecified: Secondary | ICD-10-CM | POA: Diagnosis not present

## 2023-08-06 DIAGNOSIS — S22070A Wedge compression fracture of T9-T10 vertebra, initial encounter for closed fracture: Secondary | ICD-10-CM

## 2023-08-06 LAB — GLUCOSE, CAPILLARY
Glucose-Capillary: 109 mg/dL — ABNORMAL HIGH (ref 70–99)
Glucose-Capillary: 106 mg/dL — ABNORMAL HIGH (ref 70–99)
Glucose-Capillary: 95 mg/dL (ref 70–99)
Glucose-Capillary: 104 mg/dL — ABNORMAL HIGH (ref 70–99)

## 2023-08-06 LAB — CBC WITH DIFFERENTIAL/PLATELET
Abs Immature Granulocytes: 0.11 10*3/uL — ABNORMAL HIGH (ref 0.00–0.07)
Basophils Absolute: 0.1 10*3/uL (ref 0.0–0.1)
Basophils Relative: 1 %
Eosinophils Absolute: 0.3 10*3/uL (ref 0.0–0.5)
Eosinophils Relative: 3 %
HCT: 35.8 % — ABNORMAL LOW (ref 36.0–46.0)
Hemoglobin: 11.7 g/dL — ABNORMAL LOW (ref 12.0–15.0)
Immature Granulocytes: 1 %
Lymphocytes Relative: 25 %
Lymphs Abs: 3 10*3/uL (ref 0.7–4.0)
MCH: 29.8 pg (ref 26.0–34.0)
MCHC: 32.7 g/dL (ref 30.0–36.0)
MCV: 91.1 fL (ref 80.0–100.0)
Monocytes Absolute: 1.1 10*3/uL — ABNORMAL HIGH (ref 0.1–1.0)
Monocytes Relative: 9 %
Neutro Abs: 7.2 10*3/uL (ref 1.7–7.7)
Neutrophils Relative %: 61 %
Platelets: 261 10*3/uL (ref 150–400)
RBC: 3.93 MIL/uL (ref 3.87–5.11)
RDW: 14.6 % (ref 11.5–15.5)
WBC: 11.8 10*3/uL — ABNORMAL HIGH (ref 4.0–10.5)
nRBC: 0 % (ref 0.0–0.2)

## 2023-08-06 LAB — COMPREHENSIVE METABOLIC PANEL
ALT: 16 U/L (ref 0–44)
AST: 27 U/L (ref 15–41)
Albumin: 3.6 g/dL (ref 3.5–5.0)
Alkaline Phosphatase: 64 U/L (ref 38–126)
Anion gap: 15 (ref 5–15)
BUN: 26 mg/dL — ABNORMAL HIGH (ref 8–23)
CO2: 21 mmol/L — ABNORMAL LOW (ref 22–32)
Calcium: 9.5 mg/dL (ref 8.9–10.3)
Chloride: 102 mmol/L (ref 98–111)
Creatinine, Ser: 1.2 mg/dL — ABNORMAL HIGH (ref 0.44–1.00)
GFR, Estimated: 43 mL/min — ABNORMAL LOW (ref >60)
Glucose, Bld: 123 mg/dL — ABNORMAL HIGH (ref 70–99)
Potassium: 4.9 mmol/L (ref 3.5–5.1)
Sodium: 138 mmol/L (ref 135–145)
Total Bilirubin: 0.7 mg/dL (ref 0.0–1.2)
Total Protein: 7.7 g/dL (ref 6.5–8.1)

## 2023-08-06 LAB — MAGNESIUM: Magnesium: 2.1 mg/dL (ref 1.7–2.4)

## 2023-08-06 LAB — PHOSPHORUS: Phosphorus: 2.9 mg/dL (ref 2.5–4.6)

## 2023-08-06 MED ORDER — METHYLPREDNISOLONE ACETATE 40 MG/ML IJ SUSP
40.0000 mg | Freq: Once | INTRAMUSCULAR | Status: AC
  Administered 2023-08-07: 40 mg via INTRA_ARTICULAR
  Filled 2023-08-06: qty 1

## 2023-08-06 MED ORDER — POLYETHYLENE GLYCOL 3350 17 G PO PACK
17.0000 g | PACK | Freq: Two times a day (BID) | ORAL | Status: DC
  Administered 2023-08-06 – 2023-08-11 (×9): 17 g via ORAL
  Filled 2023-08-06 (×13): qty 1

## 2023-08-06 MED ORDER — BUPIVACAINE HCL (PF) 0.5 % IJ SOLN
10.0000 mL | Freq: Once | INTRAMUSCULAR | Status: AC
  Administered 2023-08-07: 10 mL
  Filled 2023-08-06: qty 10

## 2023-08-06 MED ORDER — SENNOSIDES-DOCUSATE SODIUM 8.6-50 MG PO TABS
1.0000 | ORAL_TABLET | Freq: Two times a day (BID) | ORAL | Status: DC
  Administered 2023-08-06 – 2023-08-12 (×12): 1 via ORAL
  Filled 2023-08-06 (×13): qty 1

## 2023-08-06 NOTE — Progress Notes (Signed)
 Physical Therapy Treatment Patient Details Name: Jessica [name] [name] MRN: [id number] DOB: 07-03-1929 Today's Date: 08/06/2023   History of Present Illness 89 y.o. female presents to [facility] hospital on 08/04/2023 with severe back pain after recent fall. Pt found to have T12 compression fx. PMH includes HTN, OA, hiatal hernia, CKD, DMII.    PT Comments  Pt up in chair on arrival and agreeable to session with continued progress towards acute goals. Increased time spent donning brace and educating pt on proper brace wear, with pt practicing dinning/doffing with difficulty managing shoulder buckles due to decreased hand strength. Discussed having family/aid assist with brace with pt verbalizing understanding. Pt progressing gait distance with grossly CGA for safety and RW for support with cues for closer RW proximity, step sequencing and UE use for safety and LE pain mitigation. Pt performing transfers with light min A to boost to stand. Pt continues to benefit from skilled PT services to progress toward functional mobility goals.      If plan is discharge home, recommend the following: A little help with walking and/or transfers;A lot of help with bathing/dressing/bathroom;Assistance with cooking/housework;Assist for transportation;Help with stairs or ramp for entrance   Can travel by private vehicle        Equipment Recommendations  None recommended by PT    Recommendations for Other Services       Precautions / Restrictions Precautions Precautions: Fall;Back Precaution Booklet Issued: No Precaution Comments: verbally reviewed back precautions Required Braces or Orthoses: Spinal Brace Spinal Brace: Thoracolumbosacral orthotic;Applied in sitting position Restrictions Weight Bearing Restrictions Per Provider Order: No     Mobility  Bed Mobility Overal bed mobility: Needs Assistance             General bed mobility comments: OOB upon arrival    Transfers Overall transfer level: Needs  assistance Equipment used: Rolling walker (2 wheels) Transfers: Sit to/from Stand Sit to Stand: Min assist           General transfer comment: light min A to power up from low recliner    Ambulation/Gait Ambulation/Gait assistance: Contact guard assist Gait Distance (Feet): 65 Feet Assistive device: Rolling walker (2 wheels) Gait Pattern/deviations: Step-to pattern, Step-through pattern, Trunk flexed Gait velocity: reduced     General Gait Details: slowed step-to pattern, progressing to step through with increased distance, cues for closer RW proximity and UE use to unweight painful R knee   Stairs             Wheelchair Mobility     Tilt Bed    Modified Rankin (Stroke Patients Only)       Balance Overall balance assessment: Needs assistance Sitting-balance support: Feet supported, Single extremity supported Sitting balance-Leahy Scale: Fair     Standing balance support: Single extremity supported, During functional activity Standing balance-Leahy Scale: Fair                              Cognition Arousal: Alert Behavior During Therapy: WFL for tasks assessed/performed Overall Cognitive Status: Within Functional Limits for tasks assessed                                 General Comments: Great insight, HOH        Exercises      General Comments General comments (skin integrity, edema, etc.): VSS on RA      Pertinent Vitals/Pain Pain  Assessment Pain Assessment: Faces Faces Pain Scale: Hurts little more Pain Location: knee and back Pain Descriptors / Indicators: Aching Pain Intervention(s): Monitored during session, Limited activity within patient's tolerance    Home Living                          Prior Function            PT Goals (current goals can now be found in the care plan section) Acute Rehab PT Goals Patient Stated Goal: to reduce pain, improve ambulation PT Goal Formulation: With  patient/family Time For Goal Achievement: 08/19/23 Progress towards PT goals: Progressing toward goals    Frequency    Min 1X/week      PT Plan      Co-evaluation              AM-PAC PT 6 Clicks Mobility   Outcome Measure  Help needed turning from your back to your side while in a flat bed without using bedrails?: A Little Help needed moving from lying on your back to sitting on the side of a flat bed without using bedrails?: A Little Help needed moving to and from a bed to a chair (including a wheelchair)?: A Little Help needed standing up from a chair using your arms (e.g., wheelchair or bedside chair)?: A Little Help needed to walk in hospital room?: A Little Help needed climbing 3-5 steps with a railing? : Total 6 Click Score: 16    End of Session Equipment Utilized During Treatment: Gait belt;Back brace Activity Tolerance: Patient tolerated treatment well Patient left: in chair;with call bell/phone within reach;with chair alarm set Nurse Communication: Mobility status PT Visit Diagnosis: Other abnormalities of gait and mobility (R26.89);Muscle weakness (generalized) (M62.81);Pain Pain - part of body:  (back)     Time: 9043-8979 PT Time Calculation (min) (ACUTE ONLY): 24 min  Charges:    $Gait Training: 8-22 mins $Therapeutic Activity: 8-22 mins PT General Charges $$ ACUTE PT VISIT: 1 Visit                     Jessica [name] [name] R. PTA Acute Rehabilitation Services Office: [phone]   Jessica [name] [name] 08/06/2023, 11:00 AM

## 2023-08-06 NOTE — Progress Notes (Signed)
 PROGRESS NOTE    Jessica [name]  FMW:[id number] DOB: [birth year] DOA: 08/04/2023 PCP: Blush Bleacher, MD   Brief Narrative:  The patient is a 89 year old elderly Caucasian female with past medical history significant for Mann[place] to hypertension, osteoarthritis, constipation, hiatal hernia, CKD stage IIIa, diabetes mellitus type 2 and other comorbidities who presented to the hospital with severe back pain after a fall.  She had a fall last week and was seen in urgent care yesterday and given an injection of some nausea medication.  She had no neurological signs and no unilateral weakness or numbness or loss of bowel or bladder function.  Further workup was done and she was found of a recent T12 compression fracture with 35% height loss.  She is given oxycodone , Zofran  and morphine  and pain minimally improved.  She was placed in a TLSO brace and given her continued pain interventional radiology was consulted for further evaluation for kyphoplasty.  Assessment and Plan:  Back Pain -Secondary to the T10 and T12 compression fractures -Pain management created and will continue acetaminophen  650 mg p.o./RC every 6 mild pain for pain score 1-3, methocarbamol  500 mg IV Q 6/h for muscle spasms, hydrocodone -acetaminophen  1-2 tabs p.o. every 4 as needed for moderate pain and IV fentanyl  12.5 to 50 mcg IV q. to p.o. for severe pain -WBC Trend: Recent Labs  Lab 08/04/23 1112 08/05/23 0553 08/06/23 0905  WBC 13.5* 10.4 11.8*  -TLSO brace  -Given continued Pain will consult IR to evaluate for Kyphoplasty and they are going to order an MRI for further evaluation -MRI done and showed Acute compression fracture of T12 with 30% loss of height relative to the adjacent level. Acute compression fracture of T10 with 25% loss of height in the midbody of T10. Mild edematous endplate changes anteriorly at T6-7. Multilevel spondylosis of the thoracic spine as described. Large hiatal hernia. -PT/OT recommending Home  Health   T10 and T12 compression fracture (HCC) -Pain control as above -Will consult IR as above and she is a candidate for kyphoplasty for both the T10 and T12 compression fractures; per interventional radiology they are checking her insurance authorization and will schedule a procedure once authorization has been obtained  Cystocele -Bowel regimen initiated as below    HTN (hypertension) -Continue Amlodipine  10 mg a day and resume home metoprolol  succinate 25 mg p.o. daily -Continue to hold hydrochlorothiazide  12.5 mg p.o. daily -Continue To monitor blood pressures per protocol -Last blood pressure reading was 142/65   Hyperlipidemia -Continue oral Pravastatin  20 mg a day   Osteoarthritis -Check vitamin D  level and was 44.73   Type 2 Diabetes Mellitus without complication, without long-term current use of insulin  (HCC) -Order Sensitive SSI AC/HS -Check TSH and HgA1C -Holding Oral Diabetic Medications  -CBG Trend:  Recent Labs  Lab 08/05/23 1638 08/05/23 2134 08/06/23 0620 08/06/23 1131 08/06/23 1630  GLUCAP 111* 111* 109* 106* 95  -Glucose Level Trend: Recent Labs  Lab 08/04/23 1112 08/04/23 2107 08/05/23 0553 08/06/23 0905  GLUCOSE 124* 108* 103* 123*    Dehydration -Given Gentle IVF Hydration with NS at 75 mL/hr and this has now stopped   Constipation -Bowel regimen was initiated with Bisacodyl  10 mg RC Daily PRN Moderate Consitpation and Docusate Sodium  100 mg po BID and Senna 8.6 mg po BID and Miralax  17 g po Daily PRN: Will change her bowel regimen around and then a little more aggressive and added senna docusate 1 tab p.o. twice daily and MiraLAX  continuous p.o. twice daily  along with her bisacodyl  suppository but if necessary will try an enema   Hyponatremia -Na+ Trend: Recent Labs  Lab 08/04/23 1112 08/04/23 2107 08/05/23 0553 08/06/23 0905  NA 130* 133* 133* 138  -Continue to Monitor and Trend and Repeat CMP in the AM  CKD Stage 3a, slightly  worsened -BUN/Cr Trend: Recent Labs  Lab 08/04/23 1112 08/04/23 2107 08/05/23 0553 08/06/23 0905  BUN 23 22 20  26*  CREATININE 1.10* 1.11* 1.01* 1.20*  -Avoid Nephrotoxic Medications, Contrast Dyes, Hypotension and Dehydration to Ensure Adequate Renal Perfusion and will need to Renally Adjust Meds -Continue to Monitor and Trend Renal Function carefully and repeat CMP in the AM   Hypokalemia -Patient's K+ Level Trend: Recent Labs  Lab 08/04/23 1112 08/04/23 2107 08/05/23 0553 08/06/23 0905  K 3.9 3.6 3.3* 4.9  -Replete with po Kcl 40 mEQ BID x2 yesterday -Continue to Monitor and Replete as Necessary -Repeat CMP in the AM   Normocytic Anemia/Hx of IDA -Hgb/Hct Trend: Recent Labs  Lab 08/04/23 1112 08/05/23 0553 08/06/23 0905  HGB 13.3 11.3* 11.7*  HCT 39.5 33.7* 35.8*  MCV 88.8 89.9 91.1  -Check Anemia Panel in the AM; Transfuse for Hg <7 , rapidly dropping or  if symptomatic -Continue to Monitor for S/Sx of Bleeding; No overt bleeding noted -Repeat CBC in the AM  Right Knee Pain -Right knee DG done and showed  Severe patellofemoral osteoarthritis. Tiny joint effusion. No definitive acute fracture line is seen. -Discussed with orthopedic surgery PA Ozell Purchase and plan was to have a knee injection today however nursing could not get the medications for the PA to inject; I have asked nursing to obtain the medications for knee injection and this will be done in the morning   Hypoalbuminemia -Patient's Albumin Trend: Recent Labs  Lab 08/04/23 1112 08/05/23 0553 08/06/23 0905  ALBUMIN 4.1 3.2* 3.6  -Continue to Monitor and Trend and repeat CMP in the AM  Overweight -Complicates overall prognosis and care -Estimated body mass index is 29.29 kg/m as calculated from the following:   Height as of this encounter: 5' 3 (1.6 m).   Weight as of this encounter: 75 kg.  -Weight Loss and Dietary Counseling given   DVT prophylaxis: SCDs Start: 08/04/23 2130     Code Status: Full Code Family Communication: Discussed with the daughter-in-law at bedside and also other family over the phone  Disposition Plan:  Level of care: Telemetry Medical Status is: Observation The patient will require care spanning > 2 midnights and should be moved to inpatient because: Needs further intervention with kyphoplasty by the interventional radiologist given her uncontrolled back pain   Consultants:  Interventional Radiology Discussed with Ortho PA, Ozell Purchase  Procedures:  As delineated as above  Antimicrobials:  Anti-infectives (From admission, onward)    None       Subjective: Seen and examined at bedside she was sitting in the chair and very tearful complaining of significant pain.  Also states that she has not had a bowel movement in almost a week.  No nausea or vomiting but states her pain is pretty severe pain in her back and her knee.  No other concerns or complaints at this time.  Objective: Vitals:   08/06/23 0358 08/06/23 0752 08/06/23 1205 08/06/23 1523  BP: (!) 127/57 129/69 139/60 (!) 142/65  Pulse:  80 73 81  Resp: 14 20  20   Temp: 98.2 F (36.8 C) 98.6 F (37 C) 98.2 F (36.8 C) 99.1 F (37.3  C)  TempSrc: Oral Oral Oral Oral  SpO2: 90% 93% 100% 94%  Weight:      Height:       No intake or output data in the 24 hours ending 08/06/23 1829 Filed Weights   08/04/23 1009 08/04/23 2344  Weight: 75 kg 75 kg   Examination: Physical Exam:  Constitutional: WN/WD overweight elderly chronically ill-appearing Caucasian female who appears uncomfortable Respiratory: Diminished to auscultation bilaterally, no wheezing, rales, rhonchi or crackles. Normal respiratory effort and patient is not tachypenic. No accessory muscle use.  Unlabored breathing Cardiovascular: RRR, no murmurs / rubs / gallops. S1 and S2 auscultated.  Has mild extremity edema Abdomen: Soft, non-tender, non-distended. Bowel sounds positive.  GU:  Deferred. Musculoskeletal: No clubbing / cyanosis of digits/nails. No joint deformity upper and lower extremities.  Skin: No rashes, lesions, ulcers on a limited skin evaluation. No induration; Warm and dry.  Neurologic: CN 2-12 grossly intact with no focal deficits. Romberg sign and cerebellar reflexes not assessed.  Psychiatric: Normal judgment and insight. Alert and oriented x 3. Anxious and tearful mood and affect.   Data Reviewed: I have personally reviewed following labs and imaging studies  CBC: Recent Labs  Lab 08/04/23 1112 08/05/23 0553 08/06/23 0905  WBC 13.5* 10.4 11.8*  NEUTROABS 9.4*  --  7.2  HGB 13.3 11.3* 11.7*  HCT 39.5 33.7* 35.8*  MCV 88.8 89.9 91.1  PLT 296 256 261   Basic Metabolic Panel: Recent Labs  Lab 08/04/23 1112 08/04/23 2107 08/05/23 0553 08/06/23 0905  NA 130* 133* 133* 138  K 3.9 3.6 3.3* 4.9  CL 92* 97* 97* 102  CO2 24 22 24  21*  GLUCOSE 124* 108* 103* 123*  BUN 23 22 20  26*  CREATININE 1.10* 1.11* 1.01* 1.20*  CALCIUM  9.5 9.0 8.9 9.5  MG  --  2.3 2.2 2.1  PHOS  --  3.2 4.0 2.9   GFR: Estimated Creatinine Clearance: 29.6 mL/min (A) (by C-G formula based on SCr of 1.2 mg/dL (H)). Liver Function Tests: Recent Labs  Lab 08/04/23 1112 08/05/23 0553 08/06/23 0905  AST 30 19 27   ALT 17 14 16   ALKPHOS 74 58 64  BILITOT 0.8 0.7 0.7  PROT 8.4* 6.8 7.7  ALBUMIN 4.1 3.2* 3.6   No results for input(s): LIPASE, AMYLASE in the last 168 hours. No results for input(s): AMMONIA in the last 168 hours. Coagulation Profile: No results for input(s): INR, PROTIME in the last 168 hours. Cardiac Enzymes: Recent Labs  Lab 08/04/23 2107  CKTOTAL 60   BNP (last 3 results) No results for input(s): PROBNP in the last 8760 hours. HbA1C: Recent Labs    08/05/23 0553  HGBA1C 6.0*   CBG: Recent Labs  Lab 08/05/23 1638 08/05/23 2134 08/06/23 0620 08/06/23 1131 08/06/23 1630  GLUCAP 111* 111* 109* 106* 95   Lipid Profile: No  results for input(s): CHOL, HDL, LDLCALC, TRIG, CHOLHDL, LDLDIRECT in the last 72 hours. Thyroid  Function Tests: Recent Labs    08/04/23 2107  TSH 2.013   Anemia Panel: No results for input(s): VITAMINB12, FOLATE, FERRITIN, TIBC, IRON, RETICCTPCT in the last 72 hours. Sepsis Labs: No results for input(s): PROCALCITON, LATICACIDVEN in the last 168 hours.  No results found for this or any previous visit (from the past 240 hours).   Radiology Studies: MR THORACIC SPINE WO CONTRAST Result Date: 08/05/2023 CLINICAL DATA:  Compression fracture T12. EXAM: MRI THORACIC SPINE WITHOUT CONTRAST TECHNIQUE: Multiplanar, multisequence MR imaging of the thoracic spine was  performed. No intravenous contrast was administered. COMPARISON:  CT of the lumbar spine 08/04/2023. FINDINGS: Alignment: Slight degenerative anterolisthesis is present at T1-2. No other significant listhesis is present. Upper thoracic kyphosis is slightly exaggerated. Vertebrae: The T12 compression fracture is present. 30% loss of height is present relative to the adjacent level. Minimal retropulsed bone is present along the superior endplate. Edema is present along the superior endplate of T10 with the compression fracture revealing 25% loss of height in the midbody of T10. Mild edematous endplate changes are present anteriorly at T6-7. No other acute fractures are present. Cord:  Normal signal and morphology. Paraspinal and other soft tissues: The visualized lung fields are clear. A large hiatal hernia is present. Mild dependent atelectasis present bilaterally. The upper abdomen is unremarkable. Disc levels: Significant disc disease is present in the upper thoracic spine. Bilateral facet hypertrophy is noted at C7-T1 without focal stenosis. A broad-based disc protrusion is present at T11-12. No focal stenosis is present. T12-L1: Broad-based disc protrusion is asymmetric to the left. Mild facet hypertrophy is noted  bilaterally. No focal stenosis is present. Mild disc bulging facet hypertrophy is present at L1-2 without focal stenosis. IMPRESSION: 1. Acute compression fracture of T12 with 30% loss of height relative to the adjacent level. 2. Acute compression fracture of T10 with 25% loss of height in the midbody of T10. 3. Mild edematous endplate changes anteriorly at T6-7. 4. Multilevel spondylosis of the thoracic spine as described. 5. Large hiatal hernia. Electronically Signed   By: Lonni Necessary M.D.   On: 08/05/2023 21:34   Scheduled Meds:  amLODipine   10 mg Oral Daily   bupivacaine (PF)  10 mL Infiltration Once   insulin  aspart  0-5 Units Subcutaneous QHS   insulin  aspart  0-9 Units Subcutaneous TID WC   methylPREDNISolone  acetate  40 mg Intra-articular Once   metoprolol  succinate  25 mg Oral QPM   pantoprazole   40 mg Oral Daily   polyethylene glycol  17 g Oral BID   pravastatin   20 mg Oral q1800   senna-docusate  1 tablet Oral BID   Continuous Infusions:   LOS: 0 days   Alejandro Marker, DO [facility] Available via Epic secure chat 7am-7pm After these hours, please refer to coverage provider listed on [email or web address] 08/06/2023, 6:29 PM

## 2023-08-06 NOTE — TOC Progression Note (Signed)
 Transition of Care [facility]) - Progression Note    Patient Details  Name: SALA TAGUE MRN: [id number] Date of Birth: [birth year]  [facility] of [facility] CM/SW Contact  Andrez JULIANNA George, RN Phone Number: 08/06/2023, 3:55 PM  Clinical Narrative:     Awaiting approval for a kyphoplasty. TOC following.  Expected Discharge Plan: Home w Home Health Services Barriers to Discharge: Continued Medical Work up  Expected Discharge Plan and Services   Discharge Planning Services: CM Consult Post Acute Care Choice: Home Health Living arrangements for the past 2 months: Single Family Home                           HH Arranged: PT, OT HH Agency: [facility] Date [facility] Agency Contacted: 08/05/23   Representative spoke with at [facility] Agency: Darleene   Social Determinants of Health (SDOH) Interventions SDOH Screenings   Food Insecurity: No Food Insecurity (08/04/2023)  Housing: Patient Declined (08/04/2023)  Transportation Needs: No Transportation Needs (08/04/2023)  Utilities: Patient Declined (08/04/2023)  Depression (PHQ2-9): Low Risk  (03/03/2023)  Financial Resource Strain: Low Risk  (10/25/2022)  Physical Activity: Unknown (10/25/2022)  Social Connections: Socially Isolated (08/05/2023)  Stress: No Stress Concern Present (10/25/2022)  Tobacco Use: Medium Risk (08/04/2023)    Readmission Risk Interventions     No data to display

## 2023-08-07 ENCOUNTER — Inpatient Hospital Stay (HOSPITAL_COMMUNITY): Payer: Medicare Other

## 2023-08-07 DIAGNOSIS — M25561 Pain in right knee: Secondary | ICD-10-CM | POA: Diagnosis present

## 2023-08-07 DIAGNOSIS — J9811 Atelectasis: Secondary | ICD-10-CM | POA: Diagnosis not present

## 2023-08-07 DIAGNOSIS — M545 Low back pain, unspecified: Secondary | ICD-10-CM | POA: Diagnosis not present

## 2023-08-07 DIAGNOSIS — D72829 Elevated white blood cell count, unspecified: Secondary | ICD-10-CM | POA: Diagnosis not present

## 2023-08-07 DIAGNOSIS — N1831 Chronic kidney disease, stage 3a: Secondary | ICD-10-CM | POA: Diagnosis present

## 2023-08-07 DIAGNOSIS — M1711 Unilateral primary osteoarthritis, right knee: Secondary | ICD-10-CM | POA: Diagnosis present

## 2023-08-07 DIAGNOSIS — N1832 Chronic kidney disease, stage 3b: Secondary | ICD-10-CM | POA: Diagnosis not present

## 2023-08-07 DIAGNOSIS — E785 Hyperlipidemia, unspecified: Secondary | ICD-10-CM | POA: Diagnosis not present

## 2023-08-07 DIAGNOSIS — K449 Diaphragmatic hernia without obstruction or gangrene: Secondary | ICD-10-CM | POA: Diagnosis present

## 2023-08-07 DIAGNOSIS — E876 Hypokalemia: Secondary | ICD-10-CM | POA: Diagnosis present

## 2023-08-07 DIAGNOSIS — I700 Atherosclerosis of aorta: Secondary | ICD-10-CM | POA: Diagnosis not present

## 2023-08-07 DIAGNOSIS — M8008XA Age-related osteoporosis with current pathological fracture, vertebra(e), initial encounter for fracture: Secondary | ICD-10-CM | POA: Diagnosis present

## 2023-08-07 DIAGNOSIS — E1140 Type 2 diabetes mellitus with diabetic neuropathy, unspecified: Secondary | ICD-10-CM | POA: Diagnosis present

## 2023-08-07 DIAGNOSIS — Z79899 Other long term (current) drug therapy: Secondary | ICD-10-CM | POA: Diagnosis not present

## 2023-08-07 DIAGNOSIS — I129 Hypertensive chronic kidney disease with stage 1 through stage 4 chronic kidney disease, or unspecified chronic kidney disease: Secondary | ICD-10-CM | POA: Diagnosis present

## 2023-08-07 DIAGNOSIS — S22070A Wedge compression fracture of T9-T10 vertebra, initial encounter for closed fracture: Secondary | ICD-10-CM | POA: Diagnosis not present

## 2023-08-07 DIAGNOSIS — E8809 Other disorders of plasma-protein metabolism, not elsewhere classified: Secondary | ICD-10-CM | POA: Diagnosis present

## 2023-08-07 DIAGNOSIS — Z96641 Presence of right artificial hip joint: Secondary | ICD-10-CM | POA: Diagnosis not present

## 2023-08-07 DIAGNOSIS — S22080A Wedge compression fracture of T11-T12 vertebra, initial encounter for closed fracture: Secondary | ICD-10-CM | POA: Diagnosis not present

## 2023-08-07 DIAGNOSIS — S22089A Unspecified fracture of T11-T12 vertebra, initial encounter for closed fracture: Secondary | ICD-10-CM | POA: Diagnosis not present

## 2023-08-07 DIAGNOSIS — M47815 Spondylosis without myelopathy or radiculopathy, thoracolumbar region: Secondary | ICD-10-CM | POA: Diagnosis present

## 2023-08-07 DIAGNOSIS — Z862 Personal history of diseases of the blood and blood-forming organs and certain disorders involving the immune mechanism: Secondary | ICD-10-CM | POA: Diagnosis not present

## 2023-08-07 DIAGNOSIS — M8088XA Other osteoporosis with current pathological fracture, vertebra(e), initial encounter for fracture: Secondary | ICD-10-CM | POA: Diagnosis not present

## 2023-08-07 DIAGNOSIS — Z85828 Personal history of other malignant neoplasm of skin: Secondary | ICD-10-CM | POA: Diagnosis not present

## 2023-08-07 DIAGNOSIS — D631 Anemia in chronic kidney disease: Secondary | ICD-10-CM | POA: Diagnosis present

## 2023-08-07 DIAGNOSIS — Z8249 Family history of ischemic heart disease and other diseases of the circulatory system: Secondary | ICD-10-CM | POA: Diagnosis not present

## 2023-08-07 DIAGNOSIS — E119 Type 2 diabetes mellitus without complications: Secondary | ICD-10-CM | POA: Diagnosis not present

## 2023-08-07 DIAGNOSIS — E871 Hypo-osmolality and hyponatremia: Secondary | ICD-10-CM | POA: Diagnosis not present

## 2023-08-07 DIAGNOSIS — M170 Bilateral primary osteoarthritis of knee: Secondary | ICD-10-CM | POA: Diagnosis not present

## 2023-08-07 DIAGNOSIS — N8110 Cystocele, unspecified: Secondary | ICD-10-CM | POA: Diagnosis present

## 2023-08-07 DIAGNOSIS — R112 Nausea with vomiting, unspecified: Secondary | ICD-10-CM | POA: Diagnosis present

## 2023-08-07 DIAGNOSIS — K5900 Constipation, unspecified: Secondary | ICD-10-CM | POA: Diagnosis not present

## 2023-08-07 DIAGNOSIS — M47814 Spondylosis without myelopathy or radiculopathy, thoracic region: Secondary | ICD-10-CM | POA: Diagnosis present

## 2023-08-07 DIAGNOSIS — E1122 Type 2 diabetes mellitus with diabetic chronic kidney disease: Secondary | ICD-10-CM | POA: Diagnosis present

## 2023-08-07 DIAGNOSIS — E860 Dehydration: Secondary | ICD-10-CM | POA: Diagnosis not present

## 2023-08-07 DIAGNOSIS — M549 Dorsalgia, unspecified: Secondary | ICD-10-CM | POA: Diagnosis present

## 2023-08-07 DIAGNOSIS — R140 Abdominal distension (gaseous): Secondary | ICD-10-CM | POA: Diagnosis not present

## 2023-08-07 LAB — URINALYSIS, COMPLETE (UACMP) WITH MICROSCOPIC
Bilirubin Urine: NEGATIVE
Glucose, UA: NEGATIVE mg/dL
Hgb urine dipstick: NEGATIVE
Ketones, ur: NEGATIVE mg/dL
Nitrite: NEGATIVE
Protein, ur: NEGATIVE mg/dL
Specific Gravity, Urine: 1.013 (ref 1.005–1.030)
pH: 6 (ref 5.0–8.0)

## 2023-08-07 LAB — CBC WITH DIFFERENTIAL/PLATELET
Abs Immature Granulocytes: 0.19 10*3/uL — ABNORMAL HIGH (ref 0.00–0.07)
Abs Immature Granulocytes: 0.18 10*3/uL — ABNORMAL HIGH (ref 0.00–0.07)
Basophils Absolute: 0.1 10*3/uL (ref 0.0–0.1)
Basophils Absolute: 0.1 10*3/uL (ref 0.0–0.1)
Basophils Relative: 1 %
Basophils Relative: 0 %
Eosinophils Absolute: 0.5 10*3/uL (ref 0.0–0.5)
Eosinophils Absolute: 0 10*3/uL (ref 0.0–0.5)
Eosinophils Relative: 3 %
Eosinophils Relative: 0 %
HCT: 37.1 % (ref 36.0–46.0)
HCT: 35.7 % — ABNORMAL LOW (ref 36.0–46.0)
Hemoglobin: 12.2 g/dL (ref 12.0–15.0)
Hemoglobin: 11.8 g/dL — ABNORMAL LOW (ref 12.0–15.0)
Immature Granulocytes: 1 %
Immature Granulocytes: 1 %
Lymphocytes Relative: 40 %
Lymphocytes Relative: 14 %
Lymphs Abs: 6.2 10*3/uL — ABNORMAL HIGH (ref 0.7–4.0)
Lymphs Abs: 2 10*3/uL (ref 0.7–4.0)
MCH: 29.8 pg (ref 26.0–34.0)
MCH: 29.9 pg (ref 26.0–34.0)
MCHC: 32.9 g/dL (ref 30.0–36.0)
MCHC: 33.1 g/dL (ref 30.0–36.0)
MCV: 90.7 fL (ref 80.0–100.0)
MCV: 90.6 fL (ref 80.0–100.0)
Monocytes Absolute: 1.3 10*3/uL — ABNORMAL HIGH (ref 0.1–1.0)
Monocytes Absolute: 0.6 10*3/uL (ref 0.1–1.0)
Monocytes Relative: 8 %
Monocytes Relative: 4 %
Neutro Abs: 7.4 10*3/uL (ref 1.7–7.7)
Neutro Abs: 11.1 10*3/uL — ABNORMAL HIGH (ref 1.7–7.7)
Neutrophils Relative %: 47 %
Neutrophils Relative %: 81 %
Platelets: 327 10*3/uL (ref 150–400)
Platelets: 295 10*3/uL (ref 150–400)
RBC: 4.09 MIL/uL (ref 3.87–5.11)
RBC: 3.94 MIL/uL (ref 3.87–5.11)
RDW: 14.6 % (ref 11.5–15.5)
RDW: 14.4 % (ref 11.5–15.5)
WBC: 15.7 10*3/uL — ABNORMAL HIGH (ref 4.0–10.5)
WBC: 13.9 10*3/uL — ABNORMAL HIGH (ref 4.0–10.5)
nRBC: 0 % (ref 0.0–0.2)
nRBC: 0 % (ref 0.0–0.2)

## 2023-08-07 LAB — COMPREHENSIVE METABOLIC PANEL
ALT: 16 U/L (ref 0–44)
AST: 23 U/L (ref 15–41)
Albumin: 3.6 g/dL (ref 3.5–5.0)
Alkaline Phosphatase: 70 U/L (ref 38–126)
Anion gap: 14 (ref 5–15)
BUN: 24 mg/dL — ABNORMAL HIGH (ref 8–23)
CO2: 22 mmol/L (ref 22–32)
Calcium: 9.4 mg/dL (ref 8.9–10.3)
Chloride: 100 mmol/L (ref 98–111)
Creatinine, Ser: 1.06 mg/dL — ABNORMAL HIGH (ref 0.44–1.00)
GFR, Estimated: 50 mL/min — ABNORMAL LOW (ref >60)
Glucose, Bld: 110 mg/dL — ABNORMAL HIGH (ref 70–99)
Potassium: 4.2 mmol/L (ref 3.5–5.1)
Sodium: 136 mmol/L (ref 135–145)
Total Bilirubin: 0.6 mg/dL (ref 0.0–1.2)
Total Protein: 7.8 g/dL (ref 6.5–8.1)

## 2023-08-07 LAB — GLUCOSE, CAPILLARY
Glucose-Capillary: 125 mg/dL — ABNORMAL HIGH (ref 70–99)
Glucose-Capillary: 123 mg/dL — ABNORMAL HIGH (ref 70–99)
Glucose-Capillary: 145 mg/dL — ABNORMAL HIGH (ref 70–99)
Glucose-Capillary: 155 mg/dL — ABNORMAL HIGH (ref 70–99)
Glucose-Capillary: 142 mg/dL — ABNORMAL HIGH (ref 70–99)

## 2023-08-07 LAB — MAGNESIUM: Magnesium: 2.1 mg/dL (ref 1.7–2.4)

## 2023-08-07 LAB — PHOSPHORUS: Phosphorus: 3.7 mg/dL (ref 2.5–4.6)

## 2023-08-07 MED ORDER — LIDOCAINE 5 % EX PTCH
1.0000 | MEDICATED_PATCH | Freq: Every day | CUTANEOUS | Status: DC
Start: 2023-08-07 — End: 2023-08-12
  Administered 2023-08-07 – 2023-08-11 (×5): 1 via TRANSDERMAL
  Filled 2023-08-07 (×6): qty 1

## 2023-08-07 MED ORDER — LIDOCAINE 5 % EX PTCH
1.0000 | MEDICATED_PATCH | Freq: Every day | CUTANEOUS | Status: DC
  Administered 2023-08-07: 1 via TRANSDERMAL
  Filled 2023-08-07: qty 1

## 2023-08-07 MED ORDER — SMOG ENEMA
960.0000 mL | Freq: Once | RECTAL | Status: AC
  Administered 2023-08-07: 960 mL via RECTAL
  Filled 2023-08-07: qty 960

## 2023-08-07 NOTE — Progress Notes (Signed)
 Patient ID: Jessica [name], female   DOB: 09-11-1929, 89 y.o.   MRN: [id number]  Received request for steroid injection of pt's right knee. She has advanced OA and although she's been doing well for about a decade it has begun hurting again, likely secondary to her acute back issues. Knee injected with 40mg  solu-medrol  and 5ml 0.5% Marcaine . Pt tolerated the injection well. She can f/u with Dr. Beverley in 1-2 weeks.    Jessica DOROTHA Ned, PA-C Orthopedic Surgery [phone]

## 2023-08-07 NOTE — Progress Notes (Signed)
 OT Cancellation Note  Patient Details Name: Jessica [name] MRN: [id number] DOB: [birth year]   Cancelled Treatment:    Reason Eval/Treat Not Completed: Other (comment) (Awaiting pain medication due to reports of severe back pain. OT will follow-up later today.)  Keanthony Poole 08/07/2023, 10:44 AM

## 2023-08-07 NOTE — Progress Notes (Signed)
 PROGRESS NOTE    DOT SPLINTER  FMW:[id number] DOB: [birth year] DOA: 08/04/2023 PCP: Blush Bleacher, MD   Brief Narrative:  The patient is a 89 year old elderly Caucasian female with past medical history significant for Mann[place] to hypertension, osteoarthritis, constipation, hiatal hernia, CKD stage IIIa, diabetes mellitus type 2 and other comorbidities who presented to the hospital with severe back pain after a fall.  She had a fall last week and was seen in urgent care yesterday and given an injection of some nausea medication.  She had no neurological signs and no unilateral weakness or numbness or loss of bowel or bladder function.  Further workup was done and she was found of a recent T12 compression fracture with 35% height loss.  She is given oxycodone , Zofran  and morphine  and pain minimally improved.  She was placed in a TLSO brace and given her continued pain interventional radiology was consulted for further evaluation for kyphoplasty.  Currently they are waiting for insurance authorization to perform the kyphoplasty and she had a right knee injection today.  Continues to have significant pain will need to work on her pain control and given her constipation we will give her a smog enema today.  Assessment and Plan:  Significant Back Pain -Secondary to the T10 and T12 compression fractures -Pain management created and will continue acetaminophen  650 mg p.o./RC every 6 mild pain for pain score 1-3, methocarbamol  500 mg IV Q 6/h for muscle spasms, hydrocodone -acetaminophen  1-2 tabs p.o. every 4 as needed for moderate pain and IV fentanyl  12.5 to 50 mcg IV q. to p.o. for severe pain -WBC Trend: Recent Labs  Lab 08/04/23 1112 08/05/23 0553 08/06/23 0905 08/07/23 0614 08/07/23 1527  WBC 13.5* 10.4 11.8* 15.7* 13.9*  -TLSO brace  -Given continued Pain will consult IR to evaluate for Kyphoplasty and they are going to order an MRI for further evaluation -MRI done and showed Acute  compression fracture of T12 with 30% loss of height relative to the adjacent level. Acute compression fracture of T10 with 25% loss of height in the midbody of T10. Mild edematous endplate changes anteriorly at T6-7. Multilevel spondylosis of the thoracic spine as described. Large hiatal hernia. -PT/OT recommending Home Health currently   T10 and T12 Compression Fracture (HCC) -Pain control as above -Will consult IR as above and she is a candidate for kyphoplasty for both the T10 and T12 compression fractures; per interventional radiology they are checking her insurance authorization and will schedule a procedure once authorization has been obtained  Cystocele -Bowel regimen initiated as below    HTN (Hypertension) -Continue Amlodipine  10 mg a day and resume home metoprolol  succinate 25 mg p.o. daily -Continue to hold Hydrochlorothiazide  12.5 mg p.o. daily -Continue To monitor blood pressures per protocol -Last blood pressure reading was 150/76  Leukocytosis -? Reactive in the setting of Pain -Will check for Infection  -WBC Trend:  Recent Labs  Lab 08/04/23 1112 08/05/23 0553 08/06/23 0905 08/07/23 0614 08/07/23 1527  WBC 13.5* 10.4 11.8* 15.7* 13.9*  -Check U/A for Infection -If persists or does not improve will need Blood Cx x2, DG Chest X-Ray, and will check LA and Procalcitonin -Repeat CBC in the AM   Hyperlipidemia -Continue oral Pravastatin  20 mg a day   Osteoarthritis -Check vitamin D  level and was 44.73   Type 2 Diabetes Mellitus without complication, without long-term current use of insulin  (HCC) -Order Sensitive SSI AC/HS -Check TSH and HgA1C -Holding Oral Diabetic Medications  -CBG Trend:  Recent Labs  Lab 08/06/23 1131 08/06/23 1630 08/06/23 2110 08/07/23 0622 08/07/23 0809 08/07/23 1212 08/07/23 1542  GLUCAP 106* 95 104* 125* 123* 145* 155*  -Glucose Level Trend: Recent Labs  Lab 08/04/23 1112 08/04/23 2107 08/05/23 0553 08/06/23 0905  08/07/23 0614  GLUCOSE 124* 108* 103* 123* 110*    Dehydration -IV fluid hydration has now stopped   Constipation -Bowel regimen was initiated with Bisacodyl  10 mg RC Daily PRN Moderate Consitpation and Docusate Sodium  100 mg po BID and Senna 8.6 mg po BID and Miralax  17 g po Daily PRN: Will change her bowel regimen around and then a little more aggressive and added senna docusate 1 tab p.o. twice daily and MiraLAX  continuous p.o. twice daily along with her bisacodyl  suppository  -Given lack of bowel movement we will try a SMOG enema today   Hyponatremia -Na+ Trend: Recent Labs  Lab 08/04/23 1112 08/04/23 2107 08/05/23 0553 08/06/23 0905 08/07/23 0614  NA 130* 133* 133* 138 136  -Continue to Monitor and Trend and Repeat CMP in the AM  CKD Stage 3a, slightly worsened -BUN/Cr Trend: Recent Labs  Lab 08/04/23 1112 08/04/23 2107 08/05/23 0553 08/06/23 0905 08/07/23 0614  BUN 23 22 20  26* 24*  CREATININE 1.10* 1.11* 1.01* 1.20* 1.06*  -Avoid Nephrotoxic Medications, Contrast Dyes, Hypotension and Dehydration to Ensure Adequate Renal Perfusion and will need to Renally Adjust Meds -Continue to Monitor and Trend Renal Function carefully and repeat CMP in the AM   Hypokalemia -Patient's K+ Level Trend: Recent Labs  Lab 08/04/23 1112 08/04/23 2107 08/05/23 0553 08/06/23 0905 08/07/23 0614  K 3.9 3.6 3.3* 4.9 4.2  -Continue to Monitor and Replete as Necessary -Repeat CMP in the AM   Normocytic Anemia/Hx of IDA -Hgb/Hct Trend: Recent Labs  Lab 08/04/23 1112 08/05/23 0553 08/06/23 0905 08/07/23 0614 08/07/23 1527  HGB 13.3 11.3* 11.7* 12.2 11.8*  HCT 39.5 33.7* 35.8* 37.1 35.7*  MCV 88.8 89.9 91.1 90.7 90.6  -Check Anemia Panel in the AM; Transfuse for Hg <7 , rapidly dropping or  if symptomatic -Continue to Monitor for S/Sx of Bleeding; No overt bleeding noted -Repeat CBC in the AM  Right Knee Pain -Right knee DG done and showed  Severe patellofemoral  osteoarthritis. Tiny joint effusion. No definitive acute fracture line is seen. -Discussed with orthopedic surgery PA Ozell Purchase and he injected her knee with 40 mg of Solu-Medrol  and 5 mL of 0.5% Marcaine  -Orthopedic Surgery recommending outpatient follow-up with Dr. Beverley in 1 to 2 weeks  Hypoalbuminemia -Patient's Albumin Trend: Recent Labs  Lab 08/04/23 1112 08/05/23 0553 08/06/23 0905 08/07/23 0614  ALBUMIN 4.1 3.2* 3.6 3.6  -Continue to Monitor and Trend and repeat CMP in the AM  Overweight -Complicates overall prognosis and care -Estimated body mass index is 29.29 kg/m as calculated from the following:   Height as of this encounter: 5' 3 (1.6 m).   Weight as of this encounter: 75 kg.  -Weight Loss and Dietary Counseling given   DVT prophylaxis: SCDs Start: 08/04/23 2130    Code Status: Full Code Family Communication: Discussed with the daughter-in-law at bedside and daughter over telephone  Disposition Plan:  Level of care: Telemetry Medical Status is: Inpatient Remains inpatient appropriate because: Needs further clinical improvement and kyphoplasty by the interventional radiology team given her uncontrolled pain   Consultants:  Interventional radiology  Procedures:  As delineated as above  Antimicrobials:  Anti-infectives (From admission, onward)    None       Subjective: Seen  and examined at bedside and was continued to have uncontrolled pain and appeared uncomfortable.  Just had her knee injected.  No nausea or vomiting.  Feels okay but nursing states that she has been giving her pain control throughout the day.  Objective: Vitals:   08/06/23 2006 08/07/23 0755 08/07/23 1212 08/07/23 1544  BP: (!) 134/55 (!) 154/60 (!) 149/64 (!) 150/76  Pulse: 88 79 83 85  Resp: 18 16 17 18   Temp: 98.3 F (36.8 C) 98.2 F (36.8 C) 98 F (36.7 C) 98.2 F (36.8 C)  TempSrc: Oral Oral Oral Oral  SpO2: 94% 95% 92% 92%  Weight:      Height:       No  intake or output data in the 24 hours ending 08/07/23 1705  Filed Weights   08/04/23 1009 08/04/23 2344  Weight: 75 kg 75 kg   Examination: Physical Exam:  Constitutional: WN/WD overweight elderly Caucasian female who appears uncomfortable Respiratory: Diminished to auscultation bilaterally, no wheezing, rales, rhonchi or crackles. Normal respiratory effort and patient is not tachypenic. No accessory muscle use.  Unlabored breathing Cardiovascular: RRR, no murmurs / rubs / gallops. S1 and S2 auscultated.  Mild lower extremity edema.  Abdomen: Soft, a little-tender, distended secondary to body habitus. Bowel sounds positive.  GU: Deferred. Musculoskeletal: No clubbing / cyanosis of digits/nails. No joint deformity upper and lower extremities.  Skin: No rashes, lesions, ulcers on limited skin evaluation. No induration; Warm and dry.  Neurologic: CN 2-12 grossly intact with no focal deficits. Romberg sign and cerebellar reflexes not assessed.  Psychiatric: Normal judgment and insight. Alert and oriented x 3.  Anxious mood  Data Reviewed: I have personally reviewed following labs and imaging studies  CBC: Recent Labs  Lab 08/04/23 1112 08/05/23 0553 08/06/23 0905 08/07/23 0614 08/07/23 1527  WBC 13.5* 10.4 11.8* 15.7* 13.9*  NEUTROABS 9.4*  --  7.2 7.4 11.1*  HGB 13.3 11.3* 11.7* 12.2 11.8*  HCT 39.5 33.7* 35.8* 37.1 35.7*  MCV 88.8 89.9 91.1 90.7 90.6  PLT 296 256 261 327 295   Basic Metabolic Panel: Recent Labs  Lab 08/04/23 1112 08/04/23 2107 08/05/23 0553 08/06/23 0905 08/07/23 0614  NA 130* 133* 133* 138 136  K 3.9 3.6 3.3* 4.9 4.2  CL 92* 97* 97* 102 100  CO2 24 22 24  21* 22  GLUCOSE 124* 108* 103* 123* 110*  BUN 23 22 20  26* 24*  CREATININE 1.10* 1.11* 1.01* 1.20* 1.06*  CALCIUM  9.5 9.0 8.9 9.5 9.4  MG  --  2.3 2.2 2.1 2.1  PHOS  --  3.2 4.0 2.9 3.7   GFR: Estimated Creatinine Clearance: 33.5 mL/min (A) (by C-G formula based on SCr of 1.06 mg/dL (H)). Liver  Function Tests: Recent Labs  Lab 08/04/23 1112 08/05/23 0553 08/06/23 0905 08/07/23 0614  AST 30 19 27 23   ALT 17 14 16 16   ALKPHOS 74 58 64 70  BILITOT 0.8 0.7 0.7 0.6  PROT 8.4* 6.8 7.7 7.8  ALBUMIN 4.1 3.2* 3.6 3.6   No results for input(s): LIPASE, AMYLASE in the last 168 hours. No results for input(s): AMMONIA in the last 168 hours. Coagulation Profile: No results for input(s): INR, PROTIME in the last 168 hours. Cardiac Enzymes: Recent Labs  Lab 08/04/23 2107  CKTOTAL 60   BNP (last 3 results) No results for input(s): PROBNP in the last 8760 hours. HbA1C: Recent Labs    08/05/23 0553  HGBA1C 6.0*   CBG: Recent Labs  Lab  08/06/23 2110 08/07/23 0622 08/07/23 0809 08/07/23 1212 08/07/23 1542  GLUCAP 104* 125* 123* 145* 155*   Lipid Profile: No results for input(s): CHOL, HDL, LDLCALC, TRIG, CHOLHDL, LDLDIRECT in the last 72 hours. Thyroid  Function Tests: Recent Labs    08/04/23 2107  TSH 2.013   Anemia Panel: No results for input(s): VITAMINB12, FOLATE, FERRITIN, TIBC, IRON, RETICCTPCT in the last 72 hours. Sepsis Labs: No results for input(s): PROCALCITON, LATICACIDVEN in the last 168 hours.  No results found for this or any previous visit (from the past 240 hours).   Radiology Studies: MR THORACIC SPINE WO CONTRAST Result Date: 08/05/2023 CLINICAL DATA:  Compression fracture T12. EXAM: MRI THORACIC SPINE WITHOUT CONTRAST TECHNIQUE: Multiplanar, multisequence MR imaging of the thoracic spine was performed. No intravenous contrast was administered. COMPARISON:  CT of the lumbar spine 08/04/2023. FINDINGS: Alignment: Slight degenerative anterolisthesis is present at T1-2. No other significant listhesis is present. Upper thoracic kyphosis is slightly exaggerated. Vertebrae: The T12 compression fracture is present. 30% loss of height is present relative to the adjacent level. Minimal retropulsed bone is present along  the superior endplate. Edema is present along the superior endplate of T10 with the compression fracture revealing 25% loss of height in the midbody of T10. Mild edematous endplate changes are present anteriorly at T6-7. No other acute fractures are present. Cord:  Normal signal and morphology. Paraspinal and other soft tissues: The visualized lung fields are clear. A large hiatal hernia is present. Mild dependent atelectasis present bilaterally. The upper abdomen is unremarkable. Disc levels: Significant disc disease is present in the upper thoracic spine. Bilateral facet hypertrophy is noted at C7-T1 without focal stenosis. A broad-based disc protrusion is present at T11-12. No focal stenosis is present. T12-L1: Broad-based disc protrusion is asymmetric to the left. Mild facet hypertrophy is noted bilaterally. No focal stenosis is present. Mild disc bulging facet hypertrophy is present at L1-2 without focal stenosis. IMPRESSION: 1. Acute compression fracture of T12 with 30% loss of height relative to the adjacent level. 2. Acute compression fracture of T10 with 25% loss of height in the midbody of T10. 3. Mild edematous endplate changes anteriorly at T6-7. 4. Multilevel spondylosis of the thoracic spine as described. 5. Large hiatal hernia. Electronically Signed   By: Lonni Necessary M.D.   On: 08/05/2023 21:34   Scheduled Meds:  amLODipine   10 mg Oral Daily   lidocaine   1 patch Transdermal Daily   metoprolol  succinate  25 mg Oral QPM   pantoprazole   40 mg Oral Daily   polyethylene glycol  17 g Oral BID   pravastatin   20 mg Oral q1800   senna-docusate  1 tablet Oral BID   SMOG  960 mL Rectal Once   Continuous Infusions:   LOS: 0 days   Alejandro Marker, DO [facility] Available via Epic secure chat 7am-7pm After these hours, please refer to coverage provider listed on [email or web address] 08/07/2023, 5:05 PM

## 2023-08-07 NOTE — Plan of Care (Signed)
  Problem: Education: Goal: Knowledge of General Education information will improve Description: Including pain rating scale, medication(s)/side effects and non-pharmacologic comfort measures Outcome: Progressing   Problem: Health Behavior/Discharge Planning: Goal: Ability to manage health-related needs will improve Outcome: Progressing   Problem: Clinical Measurements: Goal: Ability to maintain clinical measurements within normal limits will improve Outcome: Progressing Goal: Will remain free from infection Outcome: Progressing Goal: Diagnostic test results will improve Outcome: Progressing Goal: Respiratory complications will improve Outcome: Progressing Goal: Cardiovascular complication will be avoided Outcome: Progressing   Problem: Coping: Goal: Level of anxiety will decrease Outcome: Progressing   Problem: Elimination: Goal: Will not experience complications related to urinary retention Outcome: Progressing   Problem: Pain Managment: Goal: General experience of comfort will improve and/or be controlled Outcome: Progressing   Problem: Safety: Goal: Ability to remain free from injury will improve Outcome: Progressing   Problem: Skin Integrity: Goal: Risk for impaired skin integrity will decrease Outcome: Progressing   Problem: Education: Goal: Ability to describe self-care measures that may prevent or decrease complications (Diabetes Survival Skills Education) will improve Outcome: Progressing Goal: Individualized Educational Video(s) Outcome: Progressing   Problem: Coping: Goal: Ability to adjust to condition or change in health will improve Outcome: Progressing   Problem: Fluid Volume: Goal: Ability to maintain a balanced intake and output will improve Outcome: Progressing   Problem: Health Behavior/Discharge Planning: Goal: Ability to identify and utilize available resources and services will improve Outcome: Progressing Goal: Ability to manage  health-related needs will improve Outcome: Progressing   Problem: Metabolic: Goal: Ability to maintain appropriate glucose levels will improve Outcome: Progressing   Problem: Nutritional: Goal: Maintenance of adequate nutrition will improve Outcome: Progressing Goal: Progress toward achieving an optimal weight will improve Outcome: Progressing   Problem: Skin Integrity: Goal: Risk for impaired skin integrity will decrease Outcome: Progressing   Problem: Tissue Perfusion: Goal: Adequacy of tissue perfusion will improve Outcome: Progressing

## 2023-08-07 NOTE — Plan of Care (Signed)

## 2023-08-08 DIAGNOSIS — S22080A Wedge compression fracture of T11-T12 vertebra, initial encounter for closed fracture: Secondary | ICD-10-CM | POA: Diagnosis not present

## 2023-08-08 DIAGNOSIS — M545 Low back pain, unspecified: Secondary | ICD-10-CM | POA: Diagnosis not present

## 2023-08-08 DIAGNOSIS — Z862 Personal history of diseases of the blood and blood-forming organs and certain disorders involving the immune mechanism: Secondary | ICD-10-CM | POA: Diagnosis not present

## 2023-08-08 DIAGNOSIS — K5900 Constipation, unspecified: Secondary | ICD-10-CM | POA: Diagnosis not present

## 2023-08-08 LAB — CBC WITH DIFFERENTIAL/PLATELET
Abs Immature Granulocytes: 0.15 10*3/uL — ABNORMAL HIGH (ref 0.00–0.07)
Basophils Absolute: 0 10*3/uL (ref 0.0–0.1)
Basophils Relative: 0 %
Eosinophils Absolute: 0 10*3/uL (ref 0.0–0.5)
Eosinophils Relative: 0 %
HCT: 34.9 % — ABNORMAL LOW (ref 36.0–46.0)
Hemoglobin: 11.7 g/dL — ABNORMAL LOW (ref 12.0–15.0)
Immature Granulocytes: 1 %
Lymphocytes Relative: 19 %
Lymphs Abs: 2.1 10*3/uL (ref 0.7–4.0)
MCH: 30.2 pg (ref 26.0–34.0)
MCHC: 33.5 g/dL (ref 30.0–36.0)
MCV: 89.9 fL (ref 80.0–100.0)
Monocytes Absolute: 0.6 10*3/uL (ref 0.1–1.0)
Monocytes Relative: 5 %
Neutro Abs: 8.1 10*3/uL — ABNORMAL HIGH (ref 1.7–7.7)
Neutrophils Relative %: 75 %
Platelets: 312 10*3/uL (ref 150–400)
RBC: 3.88 MIL/uL (ref 3.87–5.11)
RDW: 14.2 % (ref 11.5–15.5)
WBC: 10.9 10*3/uL — ABNORMAL HIGH (ref 4.0–10.5)
nRBC: 0 % (ref 0.0–0.2)

## 2023-08-08 LAB — COMPREHENSIVE METABOLIC PANEL
ALT: 15 U/L (ref 0–44)
AST: 19 U/L (ref 15–41)
Albumin: 3.3 g/dL — ABNORMAL LOW (ref 3.5–5.0)
Alkaline Phosphatase: 65 U/L (ref 38–126)
Anion gap: 13 (ref 5–15)
BUN: 18 mg/dL (ref 8–23)
CO2: 22 mmol/L (ref 22–32)
Calcium: 9.3 mg/dL (ref 8.9–10.3)
Chloride: 99 mmol/L (ref 98–111)
Creatinine, Ser: 0.88 mg/dL (ref 0.44–1.00)
GFR, Estimated: >60 mL/min (ref >60)
Glucose, Bld: 112 mg/dL — ABNORMAL HIGH (ref 70–99)
Potassium: 4.5 mmol/L (ref 3.5–5.1)
Sodium: 134 mmol/L — ABNORMAL LOW (ref 135–145)
Total Bilirubin: 0.5 mg/dL (ref 0.0–1.2)
Total Protein: 7.4 g/dL (ref 6.5–8.1)

## 2023-08-08 LAB — RETICULOCYTES
Immature Retic Fract: 18.7 % — ABNORMAL HIGH (ref 2.3–15.9)
RBC.: 3.9 MIL/uL (ref 3.87–5.11)
Retic Count, Absolute: 63.2 10*3/uL (ref 19.0–186.0)
Retic Ct Pct: 1.6 % (ref 0.4–3.1)

## 2023-08-08 LAB — GLUCOSE, CAPILLARY
Glucose-Capillary: 114 mg/dL — ABNORMAL HIGH (ref 70–99)
Glucose-Capillary: 138 mg/dL — ABNORMAL HIGH (ref 70–99)
Glucose-Capillary: 141 mg/dL — ABNORMAL HIGH (ref 70–99)
Glucose-Capillary: 115 mg/dL — ABNORMAL HIGH (ref 70–99)

## 2023-08-08 LAB — IRON AND TIBC
Iron: 39 ug/dL (ref 28–170)
Saturation Ratios: 14 % (ref 10.4–31.8)
TIBC: 288 ug/dL (ref 250–450)
UIBC: 249 ug/dL

## 2023-08-08 LAB — FERRITIN: Ferritin: 121 ng/mL (ref 11–307)

## 2023-08-08 LAB — FOLATE: Folate: 38.9 ng/mL (ref >5.9)

## 2023-08-08 LAB — MAGNESIUM: Magnesium: 1.9 mg/dL (ref 1.7–2.4)

## 2023-08-08 LAB — PHOSPHORUS: Phosphorus: 3.8 mg/dL (ref 2.5–4.6)

## 2023-08-08 LAB — VITAMIN B12: Vitamin B-12: 3316 pg/mL — ABNORMAL HIGH (ref 180–914)

## 2023-08-08 MED ORDER — BISACODYL 10 MG RE SUPP: 10.0000 mg | Freq: Once | RECTAL | Status: DC

## 2023-08-08 NOTE — Progress Notes (Signed)
 PROGRESS NOTE    Jessica [name]  FMW:[id number] DOB: 11-Mar-1930 DOA: 08/04/2023 PCP: Blush Bleacher, MD   Brief Narrative:  The patient is a 89 year old elderly Caucasian female with past medical history significant for Mann[place] to hypertension, osteoarthritis, constipation, hiatal hernia, CKD stage IIIa, diabetes mellitus type 2 and other comorbidities who presented to the hospital with severe back pain after a fall.  She had a fall last week and was seen in urgent care yesterday and given an injection of some nausea medication.  She had no neurological signs and no unilateral weakness or numbness or loss of bowel or bladder function.  Further workup was done and she was found of a recent T12 compression fracture with 35% height loss.  She is given oxycodone , Zofran  and morphine  and pain minimally improved.  She was placed in a TLSO brace and given her continued pain interventional radiology was consulted for further evaluation for kyphoplasty.  Currently they are waiting for insurance authorization to perform the kyphoplasty and she had a right knee injection today.  Continues to have significant pain will need to work on her pain control and given her constipation she is given a smog enema yesterday without success so we will give her another suppository today and if necessary may need to consult GI for further evaluation recommendations.  Assessment and Plan:  Significant Back Pain -Secondary to the T10 and T12 compression fractures -Pain management created and will continue acetaminophen  650 mg p.o./RC every 6 mild pain for pain score 1-3, methocarbamol  500 mg IV Q 6/h for muscle spasms, hydrocodone -acetaminophen  1-2 tabs p.o. every 4 as needed for moderate pain and IV fentanyl  12.5 to 50 mcg IV q. to p.o. for severe pain -WBC Trend: Recent Labs  Lab 08/04/23 1112 08/05/23 0553 08/06/23 0905 08/07/23 0614 08/07/23 1527 08/08/23 0539  WBC 13.5* 10.4 11.8* 15.7* 13.9* 10.9*  -TLSO brace   -Given continued Pain will consult IR to evaluate for Kyphoplasty and they are going to order an MRI for further evaluation -MRI done and showed Acute compression fracture of T12 with 30% loss of height relative to the adjacent level. Acute compression fracture of T10 with 25% loss of height in the midbody of T10. Mild edematous endplate changes anteriorly at T6-7. Multilevel spondylosis of the thoracic spine as described. Large hiatal hernia. -PT/OT recommending Home Health currently   T10 and T12 Compression Fracture (HCC) -Pain control as above -Will consult IR as above and she is a candidate for kyphoplasty for both the T10 and T12 compression fractures; per interventional radiology they are checking her insurance authorization and will schedule a procedure once authorization has been obtained and per my discussion with the IR team it is still pending  Cystocele -Bowel regimen initiated as below    HTN (Hypertension) -Continue Amlodipine  10 mg a day and resume home metoprolol  succinate 25 mg p.o. daily -Continue to hold Hydrochlorothiazide  12.5 mg p.o. daily -Continue To monitor blood pressures per protocol -Last blood pressure reading was 133/70  Leukocytosis, improving -? Reactive in the setting of Pain -Will check for Infection  -WBC Trend:  Recent Labs  Lab 08/04/23 1112 08/05/23 0553 08/06/23 0905 08/07/23 0614 08/07/23 1527 08/08/23 0539  WBC 13.5* 10.4 11.8* 15.7* 13.9* 10.9*  -Check U/A for Infection and relatively unremarkable and showed a clear appearance with yellow color urine, negative glucose, negative ketones, negative hemoglobin, small leukocytes, negative nitrites, rare bacteria, 0 to 5 cc per high-power field, 0-5 squamous epithelial cells and 0-5 WBCs -  If persists or does not improve will need Blood Cx x2, DG Chest X-Ray, and will check LA and Procalcitonin -Repeat CBC in the AM   Hyperlipidemia -Continue oral Pravastatin  20 mg a day    Osteoarthritis -Check vitamin D  level and was 44.73   Type 2 Diabetes Mellitus without complication, without long-term current use of insulin  (HCC) -Order Sensitive SSI AC/HS -Check TSH and HgA1C -Holding Oral Diabetic Medications  -CBG Trend:  Recent Labs  Lab 08/07/23 0622 08/07/23 0809 08/07/23 1212 08/07/23 1542 08/07/23 2121 08/08/23 0633 08/08/23 1159  GLUCAP 125* 123* 145* 155* 142* 114* 138*  -Glucose Level Trend: Recent Labs  Lab 08/04/23 1112 08/04/23 2107 08/05/23 0553 08/06/23 0905 08/07/23 0614 08/08/23 0539  GLUCOSE 124* 108* 103* 123* 110* 112*    Dehydration -IV fluid hydration has now stopped   Constipation -Bowel regimen was initiated with Bisacodyl  10 mg RC Daily PRN Moderate Consitpation and Docusate Sodium  100 mg po BID and Senna 8.6 mg po BID and Miralax  17 g po Daily PRN: Will change her bowel regimen around and then a little more aggressive and added senna docusate 1 tab p.o. twice daily and MiraLAX  continuous p.o. twice daily along with her bisacodyl  suppository  -Given lack of bowel movement she is given a smog enema yesterday without any success and will give another suppository today and if necessary may try another enema -KUB done and showed The bowel gas pattern is normal. No radio-opaque calculi or other acute radiographic abnormality are seen. Total hip arthroplasty changes are present on the right. There are degenerative changes in the thoracolumbar spine.   Hyponatremia -Na+ Trend: Recent Labs  Lab 08/04/23 1112 08/04/23 2107 08/05/23 0553 08/06/23 0905 08/07/23 0614 08/08/23 0539  NA 130* 133* 133* 138 136 134*  -Continue to Monitor and Trend and Repeat CMP in the AM  CKD Stage 3a, improved -BUN/Cr Trend: Recent Labs  Lab 08/04/23 1112 08/04/23 2107 08/05/23 0553 08/06/23 0905 08/07/23 0614 08/08/23 0539  BUN 23 22 20  26* 24* 18  CREATININE 1.10* 1.11* 1.01* 1.20* 1.06* 0.88  -Avoid Nephrotoxic Medications, Contrast  Dyes, Hypotension and Dehydration to Ensure Adequate Renal Perfusion and will need to Renally Adjust Meds -Continue to Monitor and Trend Renal Function carefully and repeat CMP in the AM   Hypokalemia -Patient's K+ Level Trend: Recent Labs  Lab 08/04/23 1112 08/04/23 2107 08/05/23 0553 08/06/23 0905 08/07/23 0614 08/08/23 0539  K 3.9 3.6 3.3* 4.9 4.2 4.5  -Continue to Monitor and Replete as Necessary -Repeat CMP in the AM   Normocytic Anemia/Hx of IDA -Hgb/Hct Trend: Recent Labs  Lab 08/04/23 1112 08/05/23 0553 08/06/23 0905 08/07/23 0614 08/07/23 1527 08/08/23 0539  HGB 13.3 11.3* 11.7* 12.2 11.8* 11.7*  HCT 39.5 33.7* 35.8* 37.1 35.7* 34.9*  MCV 88.8 89.9 91.1 90.7 90.6 89.9  -Checked Anemia Panel showed an iron level of 39, UIBC of 249, TIBC of 288, saturation ratios of 14%, ferritin of 121, folate level of 38.9 and a vitamin B12 of 3316; transfuse for Hg <7 , rapidly dropping or  if symptomatic -Continue to Monitor for S/Sx of Bleeding; No overt bleeding noted -Repeat CBC in the AM  Right Knee Pain -Right knee DG done and showed  Severe patellofemoral osteoarthritis. Tiny joint effusion. No definitive acute fracture line is seen. -Discussed with orthopedic surgery PA Ozell Purchase and he injected her knee with 40 mg of Solu-Medrol  and 5 mL of 0.5% Marcaine  -Orthopedic Surgery recommending outpatient follow-up with Dr. Beverley in  1 to 2 weeks  Hypoalbuminemia -Patient's Albumin Trend: Recent Labs  Lab 08/04/23 1112 08/05/23 0553 08/06/23 0905 08/07/23 0614 08/08/23 0539  ALBUMIN 4.1 3.2* 3.6 3.6 3.3*  -Continue to Monitor and Trend and repeat CMP in the AM  Overweight -Complicates overall prognosis and care -Estimated body mass index is 29.29 kg/m as calculated from the following:   Height as of this encounter: 5' 3 (1.6 m).   Weight as of this encounter: 75 kg.  -Weight Loss and Dietary Counseling given   DVT prophylaxis: SCDs Start: 08/04/23  2130    Code Status: Full Code Family Communication: Discussed with the patient's daughter Dorthea over the telephone  Disposition Plan:  Level of care: Telemetry Medical Status is: Inpatient Remains inpatient appropriate because: Continues to have significant pain and needs further pain control and kyphoplasty prior to discharge   Consultants:  Interventional Radiology  Procedures:  As delineated as above  Antimicrobials:  Anti-infectives (From admission, onward)    None       Subjective: Seen and examined at bedside and was appearing a bit more comfortable today compared to yesterday but continues complain of back pain.  States that her pain is doing better with the regimen today.  Still has not had a bowel movement yet.  No nausea or vomiting.  Thinks her knee is doing also better.  Objective: Vitals:   08/07/23 2328 08/08/23 0343 08/08/23 0918 08/08/23 1158  BP: 133/71 123/70 (!) 146/60 133/70  Pulse: 77 61 76 72  Resp: 18 18 17    Temp: 97.8 F (36.6 C) 98.1 F (36.7 C) 98.1 F (36.7 C) 97.9 F (36.6 C)  TempSrc: Oral Oral Oral Oral  SpO2: 92% 95% 94% 97%  Weight:      Height:        Intake/Output Summary (Last 24 hours) at 08/08/2023 1459 Last data filed at 08/08/2023 0600 Gross per 24 hour  Intake 150 ml  Output 700 ml  Net -550 ml   Filed Weights   08/04/23 1009 08/04/23 2344  Weight: 75 kg 75 kg   Examination: Physical Exam:  Constitutional: WN/WD overweight elderly Caucasian female who appears a little uncomfortable but better than yesterday Respiratory: Diminished to auscultation bilaterally, no wheezing, rales, rhonchi or crackles. Normal respiratory effort and patient is not tachypenic. No accessory muscle use.  Unlabored breathing Cardiovascular: RRR, no murmurs / rubs / gallops. S1 and S2 auscultated.  Has mild lower extremity edema.  Abdomen: Soft, non-tender, non-distended. Bowel sounds positive.  GU: Deferred. Musculoskeletal: No clubbing /  cyanosis of digits/nails. No joint deformity upper and lower extremities. Skin: No rashes, lesions, ulcers on limited skin evaluation. No induration; Warm and dry.  Neurologic: CN 2-12 grossly intact with no focal deficits.  Romberg sign cerebellar reflexes not assessed.  Psychiatric: Normal judgment and insight. Alert and oriented x 3. Normal mood and appropriate affect.   Data Reviewed: I have personally reviewed following labs and imaging studies  CBC: Recent Labs  Lab 08/04/23 1112 08/05/23 0553 08/06/23 0905 08/07/23 0614 08/07/23 1527 08/08/23 0539  WBC 13.5* 10.4 11.8* 15.7* 13.9* 10.9*  NEUTROABS 9.4*  --  7.2 7.4 11.1* 8.1*  HGB 13.3 11.3* 11.7* 12.2 11.8* 11.7*  HCT 39.5 33.7* 35.8* 37.1 35.7* 34.9*  MCV 88.8 89.9 91.1 90.7 90.6 89.9  PLT 296 256 261 327 295 312   Basic Metabolic Panel: Recent Labs  Lab 08/04/23 2107 08/05/23 0553 08/06/23 0905 08/07/23 0614 08/08/23 0539  NA 133* 133* 138 136  134*  K 3.6 3.3* 4.9 4.2 4.5  CL 97* 97* 102 100 99  CO2 22 24 21* 22 22  GLUCOSE 108* 103* 123* 110* 112*  BUN 22 20 26* 24* 18  CREATININE 1.11* 1.01* 1.20* 1.06* 0.88  CALCIUM  9.0 8.9 9.5 9.4 9.3  MG 2.3 2.2 2.1 2.1 1.9  PHOS 3.2 4.0 2.9 3.7 3.8   GFR: Estimated Creatinine Clearance: 40.4 mL/min (by C-G formula based on SCr of 0.88 mg/dL). Liver Function Tests: Recent Labs  Lab 08/04/23 1112 08/05/23 0553 08/06/23 0905 08/07/23 0614 08/08/23 0539  AST 30 19 27 23 19   ALT 17 14 16 16 15   ALKPHOS 74 58 64 70 65  BILITOT 0.8 0.7 0.7 0.6 0.5  PROT 8.4* 6.8 7.7 7.8 7.4  ALBUMIN 4.1 3.2* 3.6 3.6 3.3*   No results for input(s): LIPASE, AMYLASE in the last 168 hours. No results for input(s): AMMONIA in the last 168 hours. Coagulation Profile: No results for input(s): INR, PROTIME in the last 168 hours. Cardiac Enzymes: Recent Labs  Lab 08/04/23 2107  CKTOTAL 60   BNP (last 3 results) No results for input(s): PROBNP in the last 8760  hours. HbA1C: No results for input(s): HGBA1C in the last 72 hours. CBG: Recent Labs  Lab 08/07/23 1212 08/07/23 1542 08/07/23 2121 08/08/23 0633 08/08/23 1159  GLUCAP 145* 155* 142* 114* 138*   Lipid Profile: No results for input(s): CHOL, HDL, LDLCALC, TRIG, CHOLHDL, LDLDIRECT in the last 72 hours. Thyroid  Function Tests: No results for input(s): TSH, T4TOTAL, FREET4, T3FREE, THYROIDAB in the last 72 hours. Anemia Panel: Recent Labs    08/08/23 0539  VITAMINB12 3,316*  FOLATE 38.9  FERRITIN 121  TIBC 288  IRON 39  RETICCTPCT 1.6   Sepsis Labs: No results for input(s): PROCALCITON, LATICACIDVEN in the last 168 hours.  No results found for this or any previous visit (from the past 240 hours).   Radiology Studies: DG Abd 1 View Result Date: 08/07/2023 CLINICAL DATA:  Abdominal distention. EXAM: ABDOMEN - 1 VIEW COMPARISON:  08/04/2023. FINDINGS: The bowel gas pattern is normal. No radio-opaque calculi or other acute radiographic abnormality are seen. Total hip arthroplasty changes are present on the right. There are degenerative changes in the thoracolumbar spine. IMPRESSION: Nonobstructive bowel-gas pattern. Electronically Signed   By: Leita Birmingham M.D.   On: 08/07/2023 22:23   Scheduled Meds:  amLODipine   10 mg Oral Daily   bisacodyl   10 mg Rectal Once   lidocaine   1 patch Transdermal Daily   metoprolol  succinate  25 mg Oral QPM   pantoprazole   40 mg Oral Daily   polyethylene glycol  17 g Oral BID   pravastatin   20 mg Oral q1800   senna-docusate  1 tablet Oral BID   Continuous Infusions:   LOS: 1 day   Alejandro Marker, DO [facility] Available via Epic secure chat 7am-7pm After these hours, please refer to coverage provider listed on [email or web address] 08/08/2023, 2:59 PM

## 2023-08-08 NOTE — Progress Notes (Deleted)
 Patient received from the ER via stretcher; staff transferred patient off stretcher to the bed;skin cleansed for BM and condom cath replaced; fall safety reviewed; bed low, locked and alarmed. Floor mats placed on each side of the bed. Family present at bedside. Cardiac monitoring placed and verified by 2 staff members.

## 2023-08-08 NOTE — Plan of Care (Signed)
  Problem: Education: Goal: Knowledge of General Education information will improve Description: Including pain rating scale, medication(s)/side effects and non-pharmacologic comfort measures Outcome: Progressing   Problem: Health Behavior/Discharge Planning: Goal: Ability to manage health-related needs will improve Outcome: Progressing   Problem: Activity: Goal: Risk for activity intolerance will decrease Outcome: Progressing   Problem: Nutrition: Goal: Adequate nutrition will be maintained Outcome: Progressing   Problem: Coping: Goal: Level of anxiety will decrease Outcome: Progressing   Problem: Elimination: Goal: Will not experience complications related to bowel motility Outcome: Progressing Goal: Will not experience complications related to urinary retention Outcome: Progressing   Problem: Safety: Goal: Ability to remain free from injury will improve Outcome: Progressing   Problem: Pain Managment: Goal: General experience of comfort will improve and/or be controlled Outcome: Progressing   Problem: Skin Integrity: Goal: Risk for impaired skin integrity will decrease Outcome: Progressing

## 2023-08-08 NOTE — Progress Notes (Signed)
 Physical Therapy Treatment Patient Details Name: Jessica [name] MRN: [id number] DOB: 1930/04/06 Today's Date: 08/08/2023   History of Present Illness 89 y.o. female presents to [facility] on 08/04/2023 with severe back pain after recent fall. Pt found to have T12 compression fx. PMH includes HTN, OA, hiatal hernia, CKD, DMII.    PT Comments  Pt agreeable to session however with limited progress due to increased pain, despite pre-medication. Pt requiring min A for bed mobility, transfers to stand and CGA for step pivot to chair with RW for support. Pt with slow effortful movements throughout session due to pain, but no overt LOB noted. Once up in chair pt declining further gait trials due to pain. Encouraged and educated pt on importance of mobilizing frequently throughout day as tolerated with pt verbalizing understanding. Pt with good adherence to all precautions throughout session. Pt continues to benefit from skilled PT services to progress toward functional mobility goals.     If plan is discharge home, recommend the following: A little help with walking and/or transfers;A lot of help with bathing/dressing/bathroom;Assistance with cooking/housework;Assist for transportation;Help with stairs or ramp for entrance   Can travel by private vehicle        Equipment Recommendations  None recommended by PT    Recommendations for Other Services       Precautions / Restrictions Precautions Precautions: Fall;Back Precaution Booklet Issued: No Precaution Comments: verbally reviewed back precautions Required Braces or Orthoses: Spinal Brace Spinal Brace: Thoracolumbosacral orthotic;Applied in sitting position Restrictions Weight Bearing Restrictions Per Provider Order: No     Mobility  Bed Mobility Overal bed mobility: Needs Assistance Bed Mobility: Rolling, Sidelying to Sit Rolling: Min assist Sidelying to sit: Min assist       General bed mobility comments: min A to roll to L and  to elevate trunk from sidelying    Transfers Overall transfer level: Needs assistance Equipment used: Rolling walker (2 wheels) Transfers: Sit to/from Stand, Bed to chair/wheelchair/BSC Sit to Stand: Min assist   Step pivot transfers: Contact guard assist       General transfer comment: light min A to boos tto stand from EOB at lowest height, effortful steps from EOB to chair    Ambulation/Gait               General Gait Details: pt declining further gait once seted in chair due to pain   Stairs             Wheelchair Mobility     Tilt Bed    Modified Rankin (Stroke Patients Only)       Balance Overall balance assessment: Needs assistance Sitting-balance support: Feet supported, Single extremity supported Sitting balance-Leahy Scale: Fair     Standing balance support: Single extremity supported, During functional activity Standing balance-Leahy Scale: Fair                              Cognition Arousal: Alert Behavior During Therapy: WFL for tasks assessed/performed Overall Cognitive Status: Within Functional Limits for tasks assessed                                 General Comments: Great insight, VERY HOH        Exercises      General Comments General comments (skin integrity, edema, etc.): VSS on RA      Pertinent Vitals/Pain Pain Assessment  Pain Assessment: Faces Faces Pain Scale: Hurts whole lot Pain Location: knee and back Pain Descriptors / Indicators: Aching Pain Intervention(s): Premedicated before session, Monitored during session, Limited activity within patient's tolerance, Repositioned    Home Living                          Prior Function            PT Goals (current goals can now be found in the care plan section) Acute Rehab PT Goals Patient Stated Goal: to reduce pain, improve ambulation PT Goal Formulation: With patient/family Time For Goal Achievement: 08/19/23 Progress  towards PT goals: Not progressing toward goals - comment (increased pain this date)    Frequency    Min 1X/week      PT Plan      Co-evaluation              AM-PAC PT 6 Clicks Mobility   Outcome Measure  Help needed turning from your back to your side while in a flat bed without using bedrails?: A Little Help needed moving from lying on your back to sitting on the side of a flat bed without using bedrails?: A Little Help needed moving to and from a bed to a chair (including a wheelchair)?: A Little Help needed standing up from a chair using your arms (e.g., wheelchair or bedside chair)?: A Little Help needed to walk in hospital room?: A Little Help needed climbing 3-5 steps with a railing? : Total 6 Click Score: 16    End of Session Equipment Utilized During Treatment: Back brace Activity Tolerance: Patient limited by pain Patient left: in chair;with call bell/phone within reach;with chair alarm set Nurse Communication: Mobility status PT Visit Diagnosis: Other abnormalities of gait and mobility (R26.89);Muscle weakness (generalized) (M62.81);Pain Pain - part of body:  (back)     Time: 9146-9090 PT Time Calculation (min) (ACUTE ONLY): 16 min  Charges:    $Therapeutic Activity: 8-22 mins PT General Charges $$ ACUTE PT VISIT: 1 Visit                     Shakendra Griffeth R. PTA Acute Rehabilitation Services Office: [phone]   [name] 08/08/2023, 9:25 AM

## 2023-08-08 NOTE — Plan of Care (Signed)

## 2023-08-09 DIAGNOSIS — Z862 Personal history of diseases of the blood and blood-forming organs and certain disorders involving the immune mechanism: Secondary | ICD-10-CM | POA: Diagnosis not present

## 2023-08-09 DIAGNOSIS — K5900 Constipation, unspecified: Secondary | ICD-10-CM | POA: Diagnosis not present

## 2023-08-09 DIAGNOSIS — S22080A Wedge compression fracture of T11-T12 vertebra, initial encounter for closed fracture: Secondary | ICD-10-CM | POA: Diagnosis not present

## 2023-08-09 DIAGNOSIS — M545 Low back pain, unspecified: Secondary | ICD-10-CM | POA: Diagnosis not present

## 2023-08-09 LAB — CBC WITH DIFFERENTIAL/PLATELET
Abs Immature Granulocytes: 0.16 10*3/uL — ABNORMAL HIGH (ref 0.00–0.07)
Basophils Absolute: 0.1 10*3/uL (ref 0.0–0.1)
Basophils Relative: 1 %
Eosinophils Absolute: 0.1 10*3/uL (ref 0.0–0.5)
Eosinophils Relative: 1 %
HCT: 35.8 % — ABNORMAL LOW (ref 36.0–46.0)
Hemoglobin: 12 g/dL (ref 12.0–15.0)
Immature Granulocytes: 1 %
Lymphocytes Relative: 30 %
Lymphs Abs: 3.4 10*3/uL (ref 0.7–4.0)
MCH: 29.8 pg (ref 26.0–34.0)
MCHC: 33.5 g/dL (ref 30.0–36.0)
MCV: 88.8 fL (ref 80.0–100.0)
Monocytes Absolute: 0.9 10*3/uL (ref 0.1–1.0)
Monocytes Relative: 8 %
Neutro Abs: 6.7 10*3/uL (ref 1.7–7.7)
Neutrophils Relative %: 59 %
Platelets: 324 10*3/uL (ref 150–400)
RBC: 4.03 MIL/uL (ref 3.87–5.11)
RDW: 14.3 % (ref 11.5–15.5)
WBC: 11.3 10*3/uL — ABNORMAL HIGH (ref 4.0–10.5)
nRBC: 0 % (ref 0.0–0.2)

## 2023-08-09 LAB — GLUCOSE, CAPILLARY
Glucose-Capillary: 105 mg/dL — ABNORMAL HIGH (ref 70–99)
Glucose-Capillary: 127 mg/dL — ABNORMAL HIGH (ref 70–99)
Glucose-Capillary: 100 mg/dL — ABNORMAL HIGH (ref 70–99)
Glucose-Capillary: 123 mg/dL — ABNORMAL HIGH (ref 70–99)

## 2023-08-09 LAB — COMPREHENSIVE METABOLIC PANEL
ALT: 15 U/L (ref 0–44)
AST: 20 U/L (ref 15–41)
Albumin: 3.4 g/dL — ABNORMAL LOW (ref 3.5–5.0)
Alkaline Phosphatase: 65 U/L (ref 38–126)
Anion gap: 11 (ref 5–15)
BUN: 21 mg/dL (ref 8–23)
CO2: 22 mmol/L (ref 22–32)
Calcium: 9.4 mg/dL (ref 8.9–10.3)
Chloride: 100 mmol/L (ref 98–111)
Creatinine, Ser: 0.87 mg/dL (ref 0.44–1.00)
GFR, Estimated: >60 mL/min (ref >60)
Glucose, Bld: 101 mg/dL — ABNORMAL HIGH (ref 70–99)
Potassium: 4.2 mmol/L (ref 3.5–5.1)
Sodium: 133 mmol/L — ABNORMAL LOW (ref 135–145)
Total Bilirubin: 0.7 mg/dL (ref 0.0–1.2)
Total Protein: 7.4 g/dL (ref 6.5–8.1)

## 2023-08-09 LAB — MAGNESIUM: Magnesium: 2 mg/dL (ref 1.7–2.4)

## 2023-08-09 LAB — PHOSPHORUS: Phosphorus: 3.6 mg/dL (ref 2.5–4.6)

## 2023-08-09 MED ORDER — KETOROLAC TROMETHAMINE 15 MG/ML IJ SOLN
15.0000 mg | Freq: Three times a day (TID) | INTRAMUSCULAR | Status: DC | PRN
  Administered 2023-08-09 – 2023-08-11 (×2): 15 mg via INTRAVENOUS
  Filled 2023-08-09 (×2): qty 1

## 2023-08-09 MED ORDER — ADULT MULTIVITAMIN W/MINERALS CH
1.0000 | ORAL_TABLET | Freq: Every day | ORAL | Status: DC
  Administered 2023-08-09 – 2023-08-12 (×4): 1 via ORAL
  Filled 2023-08-09 (×4): qty 1

## 2023-08-09 MED ORDER — GABAPENTIN 100 MG PO CAPS
100.0000 mg | ORAL_CAPSULE | Freq: Three times a day (TID) | ORAL | Status: DC
  Administered 2023-08-09 – 2023-08-12 (×9): 100 mg via ORAL
  Filled 2023-08-09 (×9): qty 1

## 2023-08-09 MED ORDER — ENSURE ENLIVE PO LIQD
237.0000 mL | Freq: Two times a day (BID) | ORAL | Status: DC
  Administered 2023-08-09 – 2023-08-12 (×4): 237 mL via ORAL

## 2023-08-09 NOTE — Progress Notes (Signed)
 Occupational Therapy Treatment Patient Details Name: Jessica [name] MRN: [id number] DOB: January 12, 1930 Today's Date: 08/09/2023   History of present illness 89 y.o. female presents to [facility] hospital on 08/04/2023 with severe back pain after recent fall. Pt found to have T12 compression fx. PMH includes HTN, OA, hiatal hernia, CKD, DMII.   OT comments  Pt. Seen for skilled OT treatment session.  Able to ambulate to/from b.room for toielting taks with initial CGA for sit/stand but once up and moving required only S.  Standing grooming task, tolerated well.  RW management education provided. Pt. With no LOB or safety concerns while up.  Requests back to bed at end of session.  MIN A for bed mobility for LLE only.  Pt. Reports feeling better today pain wise.  Likes the back brace and reports wanting if very tight for lower back support but does not like the upper portion of the brace.  Cont. With acute OT POC.        If plan is discharge home, recommend the following:  A little help with walking and/or transfers;A lot of help with bathing/dressing/bathroom;Assistance with cooking/housework;Assist for transportation;Help with stairs or ramp for entrance   Equipment Recommendations  None recommended by OT    Recommendations for Other Services      Precautions / Restrictions Precautions Precautions: Fall;Back Precaution Comments: verbally reviewed back precautions Required Braces or Orthoses: Spinal Brace Spinal Brace: Thoracolumbosacral orthotic;Applied in sitting position Restrictions Weight Bearing Restrictions Per Provider Order: No       Mobility Bed Mobility Overal bed mobility: Needs Assistance Bed Mobility: Sit to Sidelying   Sidelying to sit: Min assist       General bed mobility comments: heigh of bed raised to simulate home env. hob lowered but pt. does report her bed has funcitons similar to hospital bed at home.  able to transition onto L elbow and bring RLE into bed without  assistance but required MIN A for LLE.  pt. requested to stay in L sidelying and found it very comfortable with pillow btwn the knees and behind back    Transfers Overall transfer level: Needs assistance Equipment used: Rolling walker (2 wheels) Transfers: Sit to/from Stand, Bed to chair/wheelchair/BSC Sit to Stand: Min assist     Step pivot transfers: Supervision           Balance                                           ADL either performed or assessed with clinical judgement   ADL Overall ADL's : Needs assistance/impaired     Grooming: Supervision/safety;Standing;Wash/dry hands           Upper Body Dressing : Sitting;Total assistance Upper Body Dressing Details (indicate cue type and reason): encouraged pt. to participate in learning how to don and/or guide others how to assist her. she reports that she will have a HH person at home that family is looking into having them be with her throughout the day     Toilet Transfer: Engineer, Maintenance (it) Details (indicate cue type and reason): 3n1 over the commode, CGA for STS but once up S Toileting- Clothing Manipulation and Hygiene: Supervision/safety;Sitting/lateral lean;Sit to/from stand Toileting - Clothing Manipulation Details (indicate cue type and reason): able to manage pulling disposable underwear down over hips before sitting, reviewed not bending forward too much, also  not to twist to flush, needs to make complete turn and face the toilet     Functional mobility during ADLs: Supervision/safety;Contact guard assist;Rolling walker (2 wheels) General ADL Comments: pt. moving better today and reports premedication working and she was not in pain during any of the mobility today    Extremity/Trunk Assessment              Vision       Perception     Praxis      Cognition Arousal: Alert Behavior During Therapy: WFL for tasks  assessed/performed Overall Cognitive Status: Within Functional Limits for tasks assessed                                 General Comments: Great insight, VERY HOH        Exercises      Shoulder Instructions       General Comments      Pertinent Vitals/ Pain       Pain Assessment Pain Assessment: No/denies pain Pain Intervention(s): Premedicated before session  Home Living                                          Prior Functioning/Environment              Frequency  Min 1X/week        Progress Toward Goals  OT Goals(current goals can now be found in the care plan section)  Progress towards OT goals: Progressing toward goals     Plan      Co-evaluation                 AM-PAC OT 6 Clicks Daily Activity     Outcome Measure   Help from another person eating meals?: None Help from another person taking care of personal grooming?: A Little Help from another person toileting, which includes using toliet, bedpan, or urinal?: A Little Help from another person bathing (including washing, rinsing, drying)?: A Lot Help from another person to put on and taking off regular upper body clothing?: A Little Help from another person to put on and taking off regular lower body clothing?: A Lot 6 Click Score: 17    End of Session Equipment Utilized During Treatment: Back brace;Rolling walker (2 wheels)  OT Visit Diagnosis: Unsteadiness on feet (R26.81);Other abnormalities of gait and mobility (R26.89);Muscle weakness (generalized) (M62.81);History of falling (Z91.81);Pain   Activity Tolerance Patient tolerated treatment well   Patient Left in bed;with call bell/phone within reach;with bed alarm set   Nurse Communication Other (comment) (secure chat with RN/CNA pt. requesting wash up and new disposable underwear. RN wanting to transition pt. off of pure wik, i agreed and reviewed current mobilty status would support this  recommendation)        Time: 9067-9045 OT Time Calculation (min): 22 min  Charges: OT General Charges $OT Visit: 1 Visit OT Treatments $Self Care/Home Management : 8-22 mins  Randall, COTA/L Acute Rehabilitation [phone]   Jessica [name] Nest Lorraine-COTA/L 08/09/2023, 10:15 AM

## 2023-08-09 NOTE — Progress Notes (Signed)
 Initial Nutrition Assessment  DOCUMENTATION CODES:   Obesity unspecified  INTERVENTION:  Liberalize diet Ensure Plus High Protein po BID, each supplement provides 350 kcal and 20 grams of protein. Multivitamin/minerals   NUTRITION DIAGNOSIS:   Increased nutrient needs related to acute illness as evidenced by estimated needs.    GOAL:   Patient will meet greater than or equal to 90% of their needs    MONITOR:   PO intake, Supplement acceptance  REASON FOR ASSESSMENT:   Consult Assessment of nutrition requirement/status, Poor PO  ASSESSMENT: 89 y.o. F Present to ED from home with complaints severe back pain. Reported fall last week and seen at an urgent care that gave her an injection and nausea medication. MRI showed acute compression fracture of T12 and T10,  Mild edematous endplate changes anteriorly at T6-7. Multilevel spondylosis of the thoracic spine as described. Large hiatal hernia. PMH; hypertension, osteoarthritis, constipation, hiatal hernia, CKD stage IIIa, diabetes mellitus type 2.  Review of EMR revealed; Patient noted to have continued significant pain, with pain control being worked on, constipation, with BM reported to day. Bowel regimen in place. Decreased oral intake suspect related to constipation and chronic pain.  There is no benefit to excessive dietary restrictions related advanced age, increased nutrient needs . Patient would benefit better from liberalized diet to help meet increased needs and promote better oral intake.   Admit weight: 75 kg Weight history: 08/04/23 75 kg  03/03/23 74.6 kg  10/29/22 75.1 kg  04/30/22 76.4 kg  10/28/21 76.8 kg  04/30/21 72 kg  04/01/21 73.1 kg  01/28/21 74.3 kg  10/26/20 73.1 kg  08/01/20 74.1 kg      Average Meal Intake: No recent documentation 50-90: 65% intake x 6 recorded meals (2/5-6)  Nutritionally Relevant Medications: Scheduled Meds:  amLODipine   10 mg Oral Daily   gabapentin   100 mg Oral  TID   metoprolol  succinate  25 mg Oral QPM   senna-docusate  1 tablet Oral BID    Labs Reviewed    NUTRITION - FOCUSED PHYSICAL EXAM:  Deferred   Diet Order:   Diet Order             Diet regular Room service appropriate? Yes with Assist; Fluid consistency: Thin  Diet effective now                   EDUCATION NEEDS:   Education needs have been addressed  Skin:  Skin Assessment: Reviewed RN Assessment  Last BM:  08/09/23 (type 7)  Height:   Ht Readings from Last 1 Encounters:  08/04/23 5' 3 (1.6 m)    Weight:   Wt Readings from Last 1 Encounters:  08/04/23 75 kg    Ideal Body Weight:     BMI:  Body mass index is 29.29 kg/m.  Estimated Nutritional Needs:   Kcal:  1875 - 2250 kcal  Protein:  100-120 g  Fluid:  72ml/kcal    Jenna Pew RDN, LDN Clinical Dietitian   If unable to reach, please contact RD Inpatient secure chat group between 8 am-4 pm daily

## 2023-08-09 NOTE — Progress Notes (Signed)
 PROGRESS NOTE    Jessica [name]  FMW:[id number] DOB: 1929/10/08 DOA: 08/04/2023 PCP: Blush Bleacher, MD   Brief Narrative:  The patient is a 89 year old elderly Caucasian female with past medical history significant for Mannam to hypertension, osteoarthritis, constipation, hiatal hernia, CKD stage IIIa, diabetes mellitus type 2 and other comorbidities who presented to the hospital with severe back pain after a fall.  She had a fall last week and was seen in urgent care yesterday and given an injection of some nausea medication.  She had no neurological signs and no unilateral weakness or numbness or loss of bowel or bladder function.  Further workup was done and she was found of a recent T12 compression fracture with 35% height loss.  She is given oxycodone , Zofran  and morphine  and pain minimally improved.  She was placed in a TLSO brace and given her continued pain interventional radiology was consulted for further evaluation for kyphoplasty.  Currently they are waiting for insurance authorization to perform the kyphoplasty and she had a right knee injection today.  Continues to have significant pain will need to work on her pain control and given her constipation she is given a smog enema yesterday without success so we will give her another suppository today and if necessary may need to consult GI for further evaluation recommendations.  Assessment and Plan:  Significant Back Pain -Secondary to the T10 and T12 compression fractures -Pain management created and will continue acetaminophen  650 mg p.o./RC every 6 mild pain for pain score 1-3,, hydrocodone -acetaminophen  1-2 tabs p.o. every 4 as needed for moderate pain and IV fentanyl  12.5 to 50 mcg IV q. to p.o. for severe pain -WBC Trend: Recent Labs  Lab 08/04/23 1112 08/05/23 0553 08/06/23 0905 08/07/23 0614 08/07/23 1527 08/08/23 0539 08/09/23 0653  WBC 13.5* 10.4 11.8* 15.7* 13.9* 10.9* 11.3*  -TLSO brace  -Given continued Pain will  consult IR to evaluate for Kyphoplasty and they are going to order an MRI for further evaluation -MRI done and showed Acute compression fracture of T12 with 30% loss of height relative to the adjacent level. Acute compression fracture of T10 with 25% loss of height in the midbody of T10. Mild edematous endplate changes anteriorly at T6-7. Multilevel spondylosis of the thoracic spine as described. Large hiatal hernia. -Continue with the methocarbamol  500 mg IV every 6 as needed for muscle spasm and also add IV ketorolac  15 mg every 8 plan for moderate pain -PT/OT recommending Home Health currently but will need to be reevaluated if she is able to get her kyphoplasty   T10 and T12 Compression Fracture (HCC) -Pain control as above -Will consult IR as above and she is a candidate for kyphoplasty for both the T10 and T12 compression fractures; per interventional radiology they are checking her insurance authorization and will schedule a procedure once authorization has been obtained and per my discussion with the IR team it is still pending  Cystocele -Bowel regimen initiated as below    HTN (Hypertension) -Continue Amlodipine  10 mg a day and resume home metoprolol  succinate 25 mg p.o. daily -Continue to hold Hydrochlorothiazide  12.5 mg p.o. daily -Continue To monitor blood pressures per protocol -Last blood pressure reading was 155/68  Leukocytosis, improving -? Reactive in the setting of Pain -Will check for Infection  -WBC Trend:  Recent Labs  Lab 08/04/23 1112 08/05/23 0553 08/06/23 0905 08/07/23 0614 08/07/23 1527 08/08/23 0539 08/09/23 0653  WBC 13.5* 10.4 11.8* 15.7* 13.9* 10.9* 11.3*  -Check U/A for Infection  and relatively unremarkable and showed a clear appearance with yellow color urine, negative glucose, negative ketones, negative hemoglobin, small leukocytes, negative nitrites, rare bacteria, 0 to 5 cc per high-power field, 0-5 squamous epithelial cells and 0-5 WBCs -If  persists or does not improve will need Blood Cx x2, DG Chest X-Ray, and will check LA and Procalcitonin -Repeat CBC in the AM   Hyperlipidemia -Continue oral Pravastatin  20 mg a day   Osteoarthritis -Check vitamin D  level and was 44.73   Type 2 Diabetes Mellitus without complication, without long-term current use of insulin  (HCC) -Order Sensitive SSI AC/HS -Check TSH and HgA1C -Holding Oral Diabetic Medications  -CBG Trend:  Recent Labs  Lab 08/08/23 0633 08/08/23 1159 08/08/23 1642 08/08/23 2123 08/09/23 0611 08/09/23 1126 08/09/23 1602  GLUCAP 114* 138* 141* 115* 105* 127* 100*  -Glucose Level Trend: Recent Labs  Lab 08/04/23 1112 08/04/23 2107 08/05/23 0553 08/06/23 0905 08/07/23 0614 08/08/23 0539 08/09/23 0653  GLUCOSE 124* 108* 103* 123* 110* 112* 101*    Dehydration -IV fluid hydration has now stopped   Constipation -Bowel regimen was initiated with Bisacodyl  10 mg RC Daily PRN Moderate Consitpation and Docusate Sodium  100 mg po BID and Senna 8.6 mg po BID and Miralax  17 g po Daily PRN: Will change her bowel regimen around and then a little more aggressive and added senna docusate 1 tab p.o. twice daily and MiraLAX  continuous p.o. twice daily along with her bisacodyl  suppository  -KUB done and showed The bowel gas pattern is normal. No radio-opaque calculi or other acute radiographic abnormality are seen. Total hip arthroplasty changes are present on the right. There are degenerative changes in the thoracolumbar spine. -She was also given a smog enema and additional suppository which caused her to have a large bowel movement yesterday and she is now feeling better   Hyponatremia -Na+ Trend: Recent Labs  Lab 08/04/23 1112 08/04/23 2107 08/05/23 0553 08/06/23 0905 08/07/23 0614 08/08/23 0539 08/09/23 0653  NA 130* 133* 133* 138 136 134* 133*  -Continue to Monitor and Trend and Repeat CMP in the AM  CKD Stage 3a, improved -BUN/Cr Trend: Recent Labs   Lab 08/04/23 1112 08/04/23 2107 08/05/23 0553 08/06/23 0905 08/07/23 0614 08/08/23 0539 08/09/23 0653  BUN 23 22 20  26* 24* 18 21  CREATININE 1.10* 1.11* 1.01* 1.20* 1.06* 0.88 0.87  -Avoid Nephrotoxic Medications, Contrast Dyes, Hypotension and Dehydration to Ensure Adequate Renal Perfusion and will need to Renally Adjust Meds -Continue to Monitor and Trend Renal Function carefully and repeat CMP in the AM   Hypokalemia -Patient's K+ Level Trend: Recent Labs  Lab 08/04/23 1112 08/04/23 2107 08/05/23 0553 08/06/23 0905 08/07/23 0614 08/08/23 0539 08/09/23 0653  K 3.9 3.6 3.3* 4.9 4.2 4.5 4.2  -Continue to Monitor and Replete as Necessary -Repeat CMP in the AM   Normocytic Anemia/Hx of IDA -Hgb/Hct Trend: Recent Labs  Lab 08/04/23 1112 08/05/23 0553 08/06/23 0905 08/07/23 0614 08/07/23 1527 08/08/23 0539 08/09/23 0653  HGB 13.3 11.3* 11.7* 12.2 11.8* 11.7* 12.0  HCT 39.5 33.7* 35.8* 37.1 35.7* 34.9* 35.8*  MCV 88.8 89.9 91.1 90.7 90.6 89.9 88.8  -Checked Anemia Panel showed an iron level of 39, UIBC of 249, TIBC of 288, saturation ratios of 14%, ferritin of 121, folate level of 38.9 and a vitamin B12 of 3316; transfuse for Hg <7 , rapidly dropping or  if symptomatic -Continue to Monitor for S/Sx of Bleeding; No overt bleeding noted -Repeat CBC in the AM  Right  Knee Pain -Right knee DG done and showed  Severe patellofemoral osteoarthritis. Tiny joint effusion. No definitive acute fracture line is seen. -Discussed with orthopedic surgery PA Ozell Purchase and he injected her knee with 40 mg of Solu-Medrol  and 5 mL of 0.5% Marcaine  -Orthopedic Surgery recommending outpatient follow-up with Dr. Beverley in 1 to 2 weeks  Hypoalbuminemia -Patient's Albumin Trend: Recent Labs  Lab 08/04/23 1112 08/05/23 0553 08/06/23 0905 08/07/23 0614 08/08/23 0539 08/09/23 0653  ALBUMIN 4.1 3.2* 3.6 3.6 3.3* 3.4*  -Continue to Monitor and Trend and repeat CMP in the  AM  Overweight -Complicates overall prognosis and care -Estimated body mass index is 29.29 kg/m as calculated from the following:   Height as of this encounter: 5' 3 (1.6 m).   Weight as of this encounter: 75 kg.  -Weight Loss and Dietary Counseling given   DVT prophylaxis: SCDs Start: 08/04/23 2130    Code Status: Full Code Family Communication: Discussed with the patient's daughter Dorthea over the telephone  Disposition Plan:  Level of care: Telemetry Medical Status is: Inpatient Remains inpatient appropriate because: His further clinical improvement and hopefully she can get a Kyphoplasty   Consultants:  Interventional Radiology  Procedures:  As delineated as above  Antimicrobials:  Anti-infectives (From admission, onward)    None       Subjective: Seen and examined at bedside and she appears more comfortable today and states that she is feeling better after her bowel movement yesterday.  No nausea or vomiting.  Eating little bit more now.  States that taking the pain medication with the muscle relaxant helps significantly.  No other concerns or complaints at this time.  Objective: Vitals:   08/09/23 0332 08/09/23 0805 08/09/23 1123 08/09/23 1601  BP: (!) 110/47 (!) 149/85 (!) 144/63 (!) 155/68  Pulse: (!) 57 87 72 72  Resp: 18  17 17   Temp: 98.7 F (37.1 C) 98.3 F (36.8 C) 98.1 F (36.7 C) 98.2 F (36.8 C)  TempSrc:  Oral Oral Oral  SpO2: 93% 95% 94% 96%  Weight:      Height:        Intake/Output Summary (Last 24 hours) at 08/09/2023 1827 Last data filed at 08/08/2023 2046 Gross per 24 hour  Intake 240 ml  Output --  Net 240 ml   Filed Weights   08/04/23 1009 08/04/23 2344  Weight: 75 kg 75 kg   Examination: Physical Exam:  Constitutional: WN/WD overweight elderly Caucasian female who appears more comfortable compared to yesterday Respiratory: Diminished to auscultation bilaterally, no wheezing, rales, rhonchi or crackles. Normal respiratory effort  and patient is not tachypenic. No accessory muscle use.  Unlabored breathing Cardiovascular: RRR, no murmurs / rubs / gallops. S1 and S2 auscultated.  Mild lower extremity edema Abdomen: Soft, non-tender, non-distended. Bowel sounds positive.  GU: Deferred. Musculoskeletal: No clubbing / cyanosis of digits/nails. No joint deformity upper and lower extremities.  Skin: No rashes, lesions, ulcers on limited skin evaluation. No induration; Warm and dry.  Neurologic: CN 2-12 grossly intact with no focal deficits. Romberg sign and cerebellar reflexes not assessed.  Psychiatric: Normal judgment and insight. Alert and oriented x 3.  Has a calmer mood  Data Reviewed: I have personally reviewed following labs and imaging studies  CBC: Recent Labs  Lab 08/06/23 0905 08/07/23 0614 08/07/23 1527 08/08/23 0539 08/09/23 0653  WBC 11.8* 15.7* 13.9* 10.9* 11.3*  NEUTROABS 7.2 7.4 11.1* 8.1* 6.7  HGB 11.7* 12.2 11.8* 11.7* 12.0  HCT 35.8* 37.1  35.7* 34.9* 35.8*  MCV 91.1 90.7 90.6 89.9 88.8  PLT 261 327 295 312 324   Basic Metabolic Panel: Recent Labs  Lab 08/05/23 0553 08/06/23 0905 08/07/23 0614 08/08/23 0539 08/09/23 0653  NA 133* 138 136 134* 133*  K 3.3* 4.9 4.2 4.5 4.2  CL 97* 102 100 99 100  CO2 24 21* 22 22 22   GLUCOSE 103* 123* 110* 112* 101*  BUN 20 26* 24* 18 21  CREATININE 1.01* 1.20* 1.06* 0.88 0.87  CALCIUM  8.9 9.5 9.4 9.3 9.4  MG 2.2 2.1 2.1 1.9 2.0  PHOS 4.0 2.9 3.7 3.8 3.6   GFR: Estimated Creatinine Clearance: 40.8 mL/min (by C-G formula based on SCr of 0.87 mg/dL). Liver Function Tests: Recent Labs  Lab 08/05/23 0553 08/06/23 0905 08/07/23 0614 08/08/23 0539 08/09/23 0653  AST 19 27 23 19 20   ALT 14 16 16 15 15   ALKPHOS 58 64 70 65 65  BILITOT 0.7 0.7 0.6 0.5 0.7  PROT 6.8 7.7 7.8 7.4 7.4  ALBUMIN 3.2* 3.6 3.6 3.3* 3.4*   No results for input(s): LIPASE, AMYLASE in the last 168 hours. No results for input(s): AMMONIA in the last 168  hours. Coagulation Profile: No results for input(s): INR, PROTIME in the last 168 hours. Cardiac Enzymes: Recent Labs  Lab 08/04/23 2107  CKTOTAL 60   BNP (last 3 results) No results for input(s): PROBNP in the last 8760 hours. HbA1C: No results for input(s): HGBA1C in the last 72 hours. CBG: Recent Labs  Lab 08/08/23 1642 08/08/23 2123 08/09/23 0611 08/09/23 1126 08/09/23 1602  GLUCAP 141* 115* 105* 127* 100*   Lipid Profile: No results for input(s): CHOL, HDL, LDLCALC, TRIG, CHOLHDL, LDLDIRECT in the last 72 hours. Thyroid  Function Tests: No results for input(s): TSH, T4TOTAL, FREET4, T3FREE, THYROIDAB in the last 72 hours. Anemia Panel: Recent Labs    08/08/23 0539  VITAMINB12 3,316*  FOLATE 38.9  FERRITIN 121  TIBC 288  IRON 39  RETICCTPCT 1.6   Sepsis Labs: No results for input(s): PROCALCITON, LATICACIDVEN in the last 168 hours.  No results found for this or any previous visit (from the past 240 hours).   Radiology Studies: DG Abd 1 View Result Date: 08/07/2023 CLINICAL DATA:  Abdominal distention. EXAM: ABDOMEN - 1 VIEW COMPARISON:  08/04/2023. FINDINGS: The bowel gas pattern is normal. No radio-opaque calculi or other acute radiographic abnormality are seen. Total hip arthroplasty changes are present on the right. There are degenerative changes in the thoracolumbar spine. IMPRESSION: Nonobstructive bowel-gas pattern. Electronically Signed   By: Leita Birmingham M.D.   On: 08/07/2023 22:23   Scheduled Meds:  amLODipine   10 mg Oral Daily   bisacodyl   10 mg Rectal Once   feeding supplement  237 mL Oral BID BM   gabapentin   100 mg Oral TID   lidocaine   1 patch Transdermal Daily   metoprolol  succinate  25 mg Oral QPM   multivitamin with minerals  1 tablet Oral Daily   pantoprazole   40 mg Oral Daily   polyethylene glycol  17 g Oral BID   pravastatin   20 mg Oral q1800   senna-docusate  1 tablet Oral BID   Continuous  Infusions:   LOS: 2 days   Alejandro Marker, DO [facility] Available via Epic secure chat 7am-7pm After these hours, please refer to coverage provider listed on [email or web address] 08/09/2023, 6:27 PM

## 2023-08-10 ENCOUNTER — Inpatient Hospital Stay (HOSPITAL_COMMUNITY): Payer: Medicare Other

## 2023-08-10 DIAGNOSIS — M545 Low back pain, unspecified: Secondary | ICD-10-CM | POA: Diagnosis not present

## 2023-08-10 DIAGNOSIS — S22080A Wedge compression fracture of T11-T12 vertebra, initial encounter for closed fracture: Secondary | ICD-10-CM | POA: Diagnosis not present

## 2023-08-10 DIAGNOSIS — K5900 Constipation, unspecified: Secondary | ICD-10-CM | POA: Diagnosis not present

## 2023-08-10 DIAGNOSIS — Z862 Personal history of diseases of the blood and blood-forming organs and certain disorders involving the immune mechanism: Secondary | ICD-10-CM | POA: Diagnosis not present

## 2023-08-10 HISTORY — PX: IR KYPHO THORACIC WITH BONE BIOPSY: IMG5518

## 2023-08-10 HISTORY — PX: IR KYPHO EA ADDL LEVEL THORACIC OR LUMBAR: IMG5520

## 2023-08-10 LAB — GLUCOSE, CAPILLARY
Glucose-Capillary: 88 mg/dL (ref 70–99)
Glucose-Capillary: 104 mg/dL — ABNORMAL HIGH (ref 70–99)
Glucose-Capillary: 91 mg/dL (ref 70–99)
Glucose-Capillary: 154 mg/dL — ABNORMAL HIGH (ref 70–99)

## 2023-08-10 LAB — CBC WITH DIFFERENTIAL/PLATELET
Abs Immature Granulocytes: 0.18 10*3/uL — ABNORMAL HIGH (ref 0.00–0.07)
Basophils Absolute: 0.1 10*3/uL (ref 0.0–0.1)
Basophils Relative: 1 %
Eosinophils Absolute: 0.2 10*3/uL (ref 0.0–0.5)
Eosinophils Relative: 2 %
HCT: 34 % — ABNORMAL LOW (ref 36.0–46.0)
Hemoglobin: 11.2 g/dL — ABNORMAL LOW (ref 12.0–15.0)
Immature Granulocytes: 2 %
Lymphocytes Relative: 33 %
Lymphs Abs: 3.8 10*3/uL (ref 0.7–4.0)
MCH: 29.7 pg (ref 26.0–34.0)
MCHC: 32.9 g/dL (ref 30.0–36.0)
MCV: 90.2 fL (ref 80.0–100.0)
Monocytes Absolute: 1.1 10*3/uL — ABNORMAL HIGH (ref 0.1–1.0)
Monocytes Relative: 10 %
Neutro Abs: 6 10*3/uL (ref 1.7–7.7)
Neutrophils Relative %: 52 %
Platelets: 327 10*3/uL (ref 150–400)
RBC: 3.77 MIL/uL — ABNORMAL LOW (ref 3.87–5.11)
RDW: 14.5 % (ref 11.5–15.5)
WBC: 11.3 10*3/uL — ABNORMAL HIGH (ref 4.0–10.5)
nRBC: 0 % (ref 0.0–0.2)

## 2023-08-10 LAB — COMPREHENSIVE METABOLIC PANEL
ALT: 15 U/L (ref 0–44)
AST: 16 U/L (ref 15–41)
Albumin: 3.2 g/dL — ABNORMAL LOW (ref 3.5–5.0)
Alkaline Phosphatase: 61 U/L (ref 38–126)
Anion gap: 13 (ref 5–15)
BUN: 36 mg/dL — ABNORMAL HIGH (ref 8–23)
CO2: 22 mmol/L (ref 22–32)
Calcium: 9 mg/dL (ref 8.9–10.3)
Chloride: 101 mmol/L (ref 98–111)
Creatinine, Ser: 1.07 mg/dL — ABNORMAL HIGH (ref 0.44–1.00)
GFR, Estimated: 49 mL/min — ABNORMAL LOW (ref >60)
Glucose, Bld: 98 mg/dL (ref 70–99)
Potassium: 4.4 mmol/L (ref 3.5–5.1)
Sodium: 136 mmol/L (ref 135–145)
Total Bilirubin: 0.5 mg/dL (ref 0.0–1.2)
Total Protein: 6.8 g/dL (ref 6.5–8.1)

## 2023-08-10 LAB — PROTIME-INR
INR: 1.1 (ref 0.8–1.2)
Prothrombin Time: 14.1 s (ref 11.4–15.2)

## 2023-08-10 LAB — PHOSPHORUS: Phosphorus: 4.4 mg/dL (ref 2.5–4.6)

## 2023-08-10 LAB — MAGNESIUM: Magnesium: 2.2 mg/dL (ref 1.7–2.4)

## 2023-08-10 MED ORDER — FENTANYL CITRATE (PF) 100 MCG/2ML IJ SOLN
INTRAMUSCULAR | Status: AC | PRN
  Administered 2023-08-10 (×2): 25 ug via INTRAVENOUS
  Administered 2023-08-10: 50 ug via INTRAVENOUS
  Administered 2023-08-10 (×2): 25 ug via INTRAVENOUS

## 2023-08-10 MED ORDER — LIDOCAINE HCL (PF) 1 % IJ SOLN
30.0000 mL | Freq: Once | INTRAMUSCULAR | Status: AC
  Administered 2023-08-10: 30 mL via INTRADERMAL

## 2023-08-10 MED ORDER — BUPIVACAINE HCL (PF) 0.5 % IJ SOLN
30.0000 mL | Freq: Once | INTRAMUSCULAR | Status: AC
  Administered 2023-08-10: 20 mL
  Filled 2023-08-10: qty 30

## 2023-08-10 MED ORDER — MIDAZOLAM HCL 2 MG/2ML IJ SOLN
INTRAMUSCULAR | Status: AC | PRN
  Administered 2023-08-10 (×3): .5 mg via INTRAVENOUS
  Administered 2023-08-10: 1 mg via INTRAVENOUS

## 2023-08-10 MED ORDER — LIDOCAINE HCL (PF) 1 % IJ SOLN
30.0000 mL | Freq: Once | INTRAMUSCULAR | Status: AC
  Administered 2023-08-10: 20 mL via INTRADERMAL
  Filled 2023-08-10: qty 30

## 2023-08-10 MED ORDER — LIDOCAINE HCL (PF) 1 % IJ SOLN
INTRAMUSCULAR | Status: AC
  Filled 2023-08-10: qty 30

## 2023-08-10 MED ORDER — CEFAZOLIN SODIUM-DEXTROSE 2-4 GM/100ML-% IV SOLN
INTRAVENOUS | Status: AC
  Filled 2023-08-10: qty 100

## 2023-08-10 MED ORDER — CEFAZOLIN SODIUM-DEXTROSE 2-4 GM/100ML-% IV SOLN
INTRAVENOUS | Status: AC | PRN
  Administered 2023-08-10: 2 g via INTRAVENOUS

## 2023-08-10 MED ORDER — MIDAZOLAM HCL 2 MG/2ML IJ SOLN
INTRAMUSCULAR | Status: AC
  Filled 2023-08-10: qty 2

## 2023-08-10 MED ORDER — KETOROLAC TROMETHAMINE 30 MG/ML IJ SOLN
INTRAMUSCULAR | Status: AC
  Filled 2023-08-10: qty 1

## 2023-08-10 MED ORDER — BUPIVACAINE HCL (PF) 0.5 % IJ SOLN
INTRAMUSCULAR | Status: AC
  Filled 2023-08-10: qty 30

## 2023-08-10 MED ORDER — BUPIVACAINE HCL (PF) 0.5 % IJ SOLN
30.0000 mL | Freq: Once | INTRAMUSCULAR | Status: AC
  Administered 2023-08-10: 30 mL

## 2023-08-10 MED ORDER — BUPIVACAINE HCL (PF) 0.25 % IJ SOLN
INTRAMUSCULAR | Status: AC
  Filled 2023-08-10: qty 30

## 2023-08-10 MED ORDER — FENTANYL CITRATE (PF) 100 MCG/2ML IJ SOLN
INTRAMUSCULAR | Status: AC
  Filled 2023-08-10: qty 2

## 2023-08-10 MED ORDER — FENTANYL CITRATE (PF) 100 MCG/2ML IJ SOLN
INTRAMUSCULAR | Status: AC
Start: 2023-08-10 — End: ?
  Filled 2023-08-10: qty 2

## 2023-08-10 MED ORDER — KETOROLAC TROMETHAMINE 30 MG/ML IJ SOLN
INTRAMUSCULAR | Status: AC | PRN
  Administered 2023-08-10: 15 mg via INTRAVENOUS

## 2023-08-10 NOTE — TOC Progression Note (Signed)
 Transition of Care [facility]) - Progression Note    Patient Details  Name: Jessica [name] MRN: [id number] Date of Birth: 1929/12/25  Transition of Care [facility]) CM/SW Contact  Jonathan Neighbor, RN Phone Number: 08/10/2023, 12:42 PM  Clinical Narrative:     Kyphoplasty approved and scheduled for today.  Need therapies to re-eval post kypho.  TOC following.  Expected Discharge Plan: Home w Home Health Services Barriers to Discharge: Continued Medical Work up  Expected Discharge Plan and Services   Discharge Planning Services: CM Consult Post Acute Care Choice: Home Health Living arrangements for the past 2 months: Single Family Home                           HH Arranged: PT, OT HH Agency: [facility] Care Date [facility] Agency Contacted: 08/05/23   Representative spoke with at [facility] Agency: Randel Buss   Social Determinants of Health (SDOH) Interventions SDOH Screenings   Food Insecurity: No Food Insecurity (08/04/2023)  Housing: Patient Declined (08/04/2023)  Transportation Needs: No Transportation Needs (08/04/2023)  Utilities: Patient Declined (08/04/2023)  Depression (PHQ2-9): Low Risk  (03/03/2023)  Financial Resource Strain: Low Risk  (10/25/2022)  Physical Activity: Unknown (10/25/2022)  Social Connections: Socially Isolated (08/05/2023)  Stress: No Stress Concern Present (10/25/2022)  Tobacco Use: Medium Risk (08/04/2023)    Readmission Risk Interventions     No data to display

## 2023-08-10 NOTE — Progress Notes (Signed)
 PROGRESS NOTE    Jessica [name]  ZOX:[id number] DOB: [birth year] DOA: 08/04/2023 PCP: Abraham Abo, MD   Brief Narrative:  The patient is a 89 year old elderly Caucasian female with past medical history significant for Mann[place] to hypertension, osteoarthritis, constipation, hiatal hernia, CKD stage IIIa, diabetes mellitus type 2 and other comorbidities who presented to the hospital with severe back pain after a fall.  She had a fall last week and was seen in urgent care yesterday and given an injection of some nausea medication.  She had no neurological signs and no unilateral weakness or numbness or loss of bowel or bladder function.  Further workup was done and she was found of a recent T12 compression fracture with 35% height loss.  She is given oxycodone , Zofran  and morphine  and pain minimally improved.  She was placed in a TLSO brace and given her continued pain interventional radiology was consulted for further evaluation for kyphoplasty.  Currently they are waiting for insurance authorization to perform the kyphoplasty and she had a right knee injection today.  Continues to have significant pain will need to work on her pain control and given her constipation she is given a smog enema yesterday without success so we will give her another suppository today and if necessary may need to consult GI for further evaluation recommendations.  Assessment and Plan:  Significant Back Pain with Radiculopathy -Secondary to the T10 and T12 compression fractures; See below -CT scan done and showed "Segmentation: 5 lumbar type vertebrae. Alignment: Mild lumbar levoscoliosis. Trace retrolisthesis of L3 on L4. Vertebrae: T12 compression fracture with 35% vertebral body height loss centrally and recent appearance with visible fracture line. No significant retropulsion or posterior element fracture. Diffuse osteopenia. No destructive bone lesion. Paraspinal and other soft tissues: Paravertebral soft tissue  edema at T12. Remainder of the abdomen and pelvis reported separately. Disc levels: Multilevel disc degeneration, most advanced on the right at L3-4 and L4-5. Mild right greater than left neural foraminal stenosis at L3-4 and mild-to-moderate right neural foraminal stenosis at L4-5. No evidence of high-grade spinal canal stenosis." -Pain management created and will continue acetaminophen  650 mg p.o./RC every 6 mild pain for pain score 1-3, hydrocodone -acetaminophen  1-2 tabs p.o. every 4 as needed for moderate pain and IV fentanyl  12.5 to 50 mcg IV q. to p.o. for severe pain -WBC Trend: Recent Labs  Lab 08/05/23 0553 08/06/23 0905 08/07/23 0614 08/07/23 1527 08/08/23 0539 08/09/23 0653 08/10/23 0540  WBC 10.4 11.8* 15.7* 13.9* 10.9* 11.3* 11.3*  -TLSO brace  -Given continued Pain will consult IR to evaluate for Kyphoplasty and they are going to order an MRI for further evaluation -MRI done and showed "Acute compression fracture of T12 with 30% loss of height relative to the adjacent level. Acute compression fracture of T10 with 25% loss of height in the midbody of T10. Mild edematous endplate changes anteriorly at T6-7. Multilevel spondylosis of the thoracic spine as described. Large hiatal hernia." -Continue with the methocarbamol  500 mg IV every 6 as needed for muscle spasm and also add IV ketorolac  15 mg every 8 plan for moderate pain -May consider obtaining a L-spine MRI for further evaluation discussion with Neurosurgery -PT/OT recommending Home Health currently but will need to be reevaluated now after her kyphoplasty.   T10 and T12 Compression Fracture (HCC) -Pain control as above -Will consult IR as above and she is a candidate for kyphoplasty for both the T10 and T12 compression fractures; per interventional radiology they are checking her insurance  authorization and will schedule a procedure once authorization has been obtained; It has been obtained and they had time to take her  down today and she is status post fluoroscopic guided T10 and T12 balloon kyphoplasty and had 2 core biopsy samples. -The compression fracture resumed osteoporotic and a bilateral transpedicular approach was utilized with wound kyphoplasty  Cystocele -Bowel regimen initiated as below    HTN (Hypertension) -Continue Amlodipine  10 mg a day and resume home metoprolol  succinate 25 mg p.o. daily -Continue to hold Hydrochlorothiazide  12.5 mg p.o. daily -Continue To monitor blood pressures per protocol -Last blood pressure reading was 137/63  Leukocytosis, improving -? Reactive in the setting of Pain -Will check for Infection  -WBC Trend:  Recent Labs  Lab 08/05/23 0553 08/06/23 0905 08/07/23 0614 08/07/23 1527 08/08/23 0539 08/09/23 0653 08/10/23 0540  WBC 10.4 11.8* 15.7* 13.9* 10.9* 11.3* 11.3*  -Check U/A for Infection and relatively unremarkable and showed a clear appearance with yellow color urine, negative glucose, negative ketones, negative hemoglobin, small leukocytes, negative nitrites, rare bacteria, 0 to 5 cc per high-power field, 0-5 squamous epithelial cells and 0-5 WBCs -If persists or does not improve will need Blood Cx x2, DG Chest X-Ray, and will check LA and Procalcitonin -Repeat CBC in the AM   Hyperlipidemia -Continue oral Pravastatin  20 mg a day   Osteoarthritis -Check vitamin D  level and was 44.73   Type 2 Diabetes Mellitus without complication, without long-term current use of insulin  (HCC) -Order Sensitive SSI AC/HS -Check TSH and HgA1C -Holding Oral Diabetic Medications  -CBG Trend:  Recent Labs  Lab 08/09/23 0611 08/09/23 1126 08/09/23 1602 08/09/23 2130 08/10/23 0557 08/10/23 1134 08/10/23 1611  GLUCAP 105* 127* 100* 123* 88 104* 91  -Glucose Level Trend: Recent Labs  Lab 08/04/23 2107 08/05/23 0553 08/06/23 0905 08/07/23 0614 08/08/23 0539 08/09/23 0653 08/10/23 0540  GLUCOSE 108* 103* 123* 110* 112* 101* 98    Dehydration -IV  fluid hydration has now stopped   Constipation -Bowel regimen was initiated with Bisacodyl  10 mg RC Daily PRN Moderate Consitpation and Docusate Sodium  100 mg po BID and Senna 8.6 mg po BID and Miralax  17 g po Daily PRN: Will change her bowel regimen around and then a little more aggressive and added senna docusate 1 tab p.o. twice daily and MiraLAX  continuous p.o. twice daily along with her bisacodyl  suppository  -KUB done and showed "The bowel gas pattern is normal. No radio-opaque calculi or other acute radiographic abnormality are seen. Total hip arthroplasty changes are present on the right. There are degenerative changes in the thoracolumbar spine." -She was also given a smog enema and additional suppository which caused her to have a large bowel movement yesterday and she is now feeling better   Hyponatremia -Na+ Trend: Recent Labs  Lab 08/04/23 2107 08/05/23 0553 08/06/23 0905 08/07/23 0614 08/08/23 0539 08/09/23 0653 08/10/23 0540  NA 133* 133* 138 136 134* 133* 136  -Continue to Monitor and Trend and Repeat CMP in the AM  CKD Stage 3a, improved -BUN/Cr Trend: Recent Labs  Lab 08/04/23 2107 08/05/23 0553 08/06/23 0905 08/07/23 0614 08/08/23 0539 08/09/23 0653 08/10/23 0540  BUN 22 20 26* 24* 18 21 36*  CREATININE 1.11* 1.01* 1.20* 1.06* 0.88 0.87 1.07*  -Avoid Nephrotoxic Medications, Contrast Dyes, Hypotension and Dehydration to Ensure Adequate Renal Perfusion and will need to Renally Adjust Meds -Continue to Monitor and Trend Renal Function carefully and repeat CMP in the AM   Hypokalemia -Patient's K+ Level Trend:  Recent Labs  Lab 08/04/23 2107 08/05/23 0553 08/06/23 0905 08/07/23 0614 08/08/23 0539 08/09/23 0653 08/10/23 0540  K 3.6 3.3* 4.9 4.2 4.5 4.2 4.4  -Continue to Monitor and Replete as Necessary -Repeat CMP in the AM   Normocytic Anemia/Hx of IDA -Hgb/Hct Trend: Recent Labs  Lab 08/05/23 0553 08/06/23 0905 08/07/23 0614 08/07/23 1527  08/08/23 0539 08/09/23 0653 08/10/23 0540  HGB 11.3* 11.7* 12.2 11.8* 11.7* 12.0 11.2*  HCT 33.7* 35.8* 37.1 35.7* 34.9* 35.8* 34.0*  MCV 89.9 91.1 90.7 90.6 89.9 88.8 90.2  -Checked Anemia Panel showed an iron level of 39, UIBC of 249, TIBC of 288, saturation ratios of 14%, ferritin of 121, folate level of 38.9 and a vitamin B12 of 3316; transfuse for Hg <7 , rapidly dropping or  if symptomatic -Continue to Monitor for S/Sx of Bleeding; No overt bleeding noted -Repeat CBC in the AM  Right Knee Pain -Right knee DG done and showed " Severe patellofemoral osteoarthritis. Tiny joint effusion. No definitive acute fracture line is seen." -Discussed with orthopedic surgery PA Steffanie Edouard and he injected her knee with 40 mg of Solu-Medrol  and 5 mL of 0.5% Marcaine  -Orthopedic Surgery recommending outpatient follow-up with Dr. Abigail Abler in 1 to 2 weeks  Hypoalbuminemia -Patient's Albumin Trend: Recent Labs  Lab 08/04/23 1112 08/05/23 0553 08/06/23 0905 08/07/23 0614 08/08/23 0539 08/09/23 0653 08/10/23 0540  ALBUMIN 4.1 3.2* 3.6 3.6 3.3* 3.4* 3.2*  -Continue to Monitor and Trend and repeat CMP in the AM  Overweight -Complicates overall prognosis and care -Estimated body mass index is 29.29 kg/m as calculated from the following:   Height as of this encounter: 5\' 3"  (1.6 m).   Weight as of this encounter: 75 kg.  -Weight Loss and Dietary Counseling given -Dietitian consulted and recommended liberalizing diet and adding Ensure half plus protein p.o. twice daily and multivitamin with minerals   DVT prophylaxis: SCDs Start: 08/04/23 2130    Code Status: Full Code Family Communication: No family present at bedside  Disposition Plan:  Level of care: Telemetry Medical Status is: Inpatient Remains inpatient appropriate because: Needs further clinical improvement and reevaluation by PT OT   Consultants:  Interventional Radiology  Procedures:  As delineated as above  FLUOROSCOPY  GUIDED T10 AND T12 BALLOON KYPHOPLASTY by Dr. Hershal Loron  Antimicrobials:  Anti-infectives (From admission, onward)    Start     Dose/Rate Route Frequency Ordered Stop   08/10/23 1333  ceFAZolin  (ANCEF ) IVPB 2g/100 mL premix        over 30 Minutes Intravenous Continuous PRN 08/10/23 1340 08/10/23 1357       Subjective: Seen and examined at bedside and was happy that she is going for procedure today.  No nausea or vomiting.  States her abdomen is doing better had another bowel movement.  Continues to have some back pain and also complains of some neuropathy in her legs with some tingling and paresthesias.  No other concerns or complaints at this time.  Objective: Vitals:   08/10/23 1535 08/10/23 1600 08/10/23 1635 08/10/23 1705  BP: 136/71 118/64 133/63 137/63  Pulse: 69 68 77 76  Resp:  14 16 15   Temp: 98.6 F (37 C) 97.7 F (36.5 C) 98.3 F (36.8 C) 98.1 F (36.7 C)  TempSrc: Oral Axillary Oral Oral  SpO2: 96% 96% 95% 95%  Weight:      Height:        Intake/Output Summary (Last 24 hours) at 08/10/2023 1723 Last data filed  at 08/10/2023 1500 Gross per 24 hour  Intake 460 ml  Output 200 ml  Net 260 ml   Filed Weights   08/04/23 1009 08/04/23 2344  Weight: 75 kg 75 kg   Examination: Physical Exam:  Constitutional: WN/WD overweight elderly Caucasian female who appears calm and in no acute distress Respiratory: Diminished to auscultation bilaterally, no wheezing, rales, rhonchi or crackles. Normal respiratory effort and patient is not tachypenic. No accessory muscle use.  Unlabored breathing Cardiovascular: RRR, no murmurs / rubs / gallops. S1 and S2 auscultated.  Has some slight lower extremity edema Abdomen: Soft, non-tender, non-distended. Bowel sounds positive.  GU: Deferred. Musculoskeletal: No clubbing / cyanosis of digits/nails. No joint deformity upper and lower extremities Skin: No rashes, lesions, ulcers. No induration; Warm and dry.  Neurologic: CN  2-12 grossly intact with no focal deficits.  Does have some neuropathy with some numbness and tingling on the right leg compared to the left.  Romberg sign cerebellar reflexes not assessed.  Psychiatric: Normal judgment and insight. Alert and oriented x 3. Normal mood and appropriate affect.   Data Reviewed: I have personally reviewed following labs and imaging studies  CBC: Recent Labs  Lab 08/07/23 0614 08/07/23 1527 08/08/23 0539 08/09/23 0653 08/10/23 0540  WBC 15.7* 13.9* 10.9* 11.3* 11.3*  NEUTROABS 7.4 11.1* 8.1* 6.7 6.0  HGB 12.2 11.8* 11.7* 12.0 11.2*  HCT 37.1 35.7* 34.9* 35.8* 34.0*  MCV 90.7 90.6 89.9 88.8 90.2  PLT 327 295 312 324 327   Basic Metabolic Panel: Recent Labs  Lab 08/06/23 0905 08/07/23 0614 08/08/23 0539 08/09/23 0653 08/10/23 0540  NA 138 136 134* 133* 136  K 4.9 4.2 4.5 4.2 4.4  CL 102 100 99 100 101  CO2 21* 22 22 22 22   GLUCOSE 123* 110* 112* 101* 98  BUN 26* 24* 18 21 36*  CREATININE 1.20* 1.06* 0.88 0.87 1.07*  CALCIUM  9.5 9.4 9.3 9.4 9.0  MG 2.1 2.1 1.9 2.0 2.2  PHOS 2.9 3.7 3.8 3.6 4.4   GFR: Estimated Creatinine Clearance: 33.2 mL/min (A) (by C-G formula based on SCr of 1.07 mg/dL (H)). Liver Function Tests: Recent Labs  Lab 08/06/23 0905 08/07/23 0614 08/08/23 0539 08/09/23 0653 08/10/23 0540  AST 27 23 19 20 16   ALT 16 16 15 15 15   ALKPHOS 64 70 65 65 61  BILITOT 0.7 0.6 0.5 0.7 0.5  PROT 7.7 7.8 7.4 7.4 6.8  ALBUMIN 3.6 3.6 3.3* 3.4* 3.2*   No results for input(s): "LIPASE", "AMYLASE" in the last 168 hours. No results for input(s): "AMMONIA" in the last 168 hours. Coagulation Profile: Recent Labs  Lab 08/10/23 1057  INR 1.1   Cardiac Enzymes: Recent Labs  Lab 08/04/23 2107  CKTOTAL 60   BNP (last 3 results) No results for input(s): "PROBNP" in the last 8760 hours. HbA1C: No results for input(s): "HGBA1C" in the last 72 hours. CBG: Recent Labs  Lab 08/09/23 1602 08/09/23 2130 08/10/23 0557  08/10/23 1134 08/10/23 1611  GLUCAP 100* 123* 88 104* 91   Lipid Profile: No results for input(s): "CHOL", "HDL", "LDLCALC", "TRIG", "CHOLHDL", "LDLDIRECT" in the last 72 hours. Thyroid  Function Tests: No results for input(s): "TSH", "T4TOTAL", "FREET4", "T3FREE", "THYROIDAB" in the last 72 hours. Anemia Panel: Recent Labs    08/08/23 0539  VITAMINB12 3,316*  FOLATE 38.9  FERRITIN 121  TIBC 288  IRON 39  RETICCTPCT 1.6   Sepsis Labs: No results for input(s): "PROCALCITON", "LATICACIDVEN" in the last 168 hours.  No results found for this or any previous visit (from the past 240 hours).   Radiology Studies: IR KYPHO THORACIC WITH BONE BIOPSY Result Date: 08/10/2023 INDICATION: 89 year old female with T10 and T12 presumed osteoporotic compression fractures and intractable back pain unresponsive to medical management. EXAM: FLUOROSCOPY GUIDED T10 AND T12 CORE BONE BIOPSY AND BALLOON KYPHOPLASTY COMPARISON:  MRI of the thoracic spine August 05, 2023. MEDICATIONS: As antibiotic prophylaxis, Ancef  2 g IV was ordered pre-procedure and administered intravenously within 1 hour of incision. All current medications are in the EMR and have been reviewed as part of this encounter. ANESTHESIA/SEDATION: Moderate (conscious) sedation was employed during this procedure. A total of Versed  2.5 mg, fentanyl  150 mcg and Toradol  15 mg were administered intravenously by the radiology nurse. Total intra-service moderate Sedation Time: 68 minutes. The patient's level of consciousness and vital signs were monitored continuously by radiology nursing throughout the procedure under my direct supervision. FLUOROSCOPY: Radiation Exposure Index (as provided by the fluoroscopic device): 2,768 mGy Kerma COMPLICATIONS: None immediate. PROCEDURE: Following a full explanation of the procedure along with the potential associated complications, an informed witnessed consent was obtained. The patient was placed in prone position  on the angiography table. The thoracic spine region was prepped and draped in a sterile fashion. Under fluoroscopy, the T10 vertebral body was delineated and the skin area was marked. The skin was infiltrated with a 1% Lidocaine  approximately 3 cm lateral to the spinous process projection on the right. Using a 22-gauge spinal needle, the soft issue and the peripedicular space and periosteum were infiltrated with Bupivacaine  0.25%. A skin incision was made at the access site. Subsequently, an 11-gauge Kyphon trocar was inserted under fluoroscopic guidance until contact with the pedicle was obtained. The trocar was inserted under light hammer tapping into the pedicle until the posterior boundaries of the vertebral body was reached. The diamond mandrill was removed and one core biopsy wasobtained. The skin was infiltrated with a 1% Lidocaine  approximately 3 cm lateral to the spinous process projection on the left. Using a 22-gauge spinal needle, the soft issue and the peripedicular space and periosteum were infiltrated with Bupivacaine  0.25%. A skin incision was made at the access site. Subsequently, an 11-gauge Kyphon trocar was inserted under fluoroscopic guidance until contact with the pedicle was obtained. The trocar was inserted under light hammer tapping into the pedicle until the posterior boundaries of the vertebral body was reached. The diamond mandrill was removed. A bone drill was coaxially advanced within the anterior third of the vertebral body and then exchanged for inflatable Kyphon balloons. These were centered within the mid-aspect of the vertebral body. The balloons were inflated to create a void to serve as a repository for the bone cement. Both balloons were deflated and through both cannulas, under continuous fluoroscopy guidance in the AP and lateral views, the vertebral body was filled with previously mixed polymethyl-methacrylate (PMMA) added to barium for opacification. Both cannulas were later  removed. Under fluoroscopy, the T12 vertebral body was delineated and the skin area was marked. The skin was infiltrated with a 1% Lidocaine  approximately 3.2 cm lateral to the spinous process projection on the right. Using a 22-gauge spinal needle, the soft issue and the peripedicular space and periosteum were infiltrated with Bupivacaine  0.25%. A skin incision was made at the access site. Subsequently, an 11-gauge Kyphon trocar was inserted under fluoroscopic guidance until contact with the pedicle was obtained. The trocar was inserted under light hammer tapping into the pedicle until the  posterior boundaries of the vertebral body was reached. The diamond mandrill was removed and one core biopsy wasobtained. The skin was infiltrated with a 1% Lidocaine  approximately 3.2 cm lateral to the spinous process projection on the left. Using a 22-gauge spinal needle, the soft issue and the peripedicular space and periosteum were infiltrated with Bupivacaine  0.25%. A skin incision was made at the access site. Subsequently, an 11-gauge Kyphon trocar was inserted under fluoroscopic guidance until contact with the pedicle was obtained. The trocar was inserted under light hammer tapping into the pedicle until the posterior boundaries of the vertebral body was reached. The diamond mandrill was removed. A bone drill was coaxially advanced within the anterior third of the vertebral body and then exchanged for inflatable Kyphon balloons. These were centered within the mid-aspect of the vertebral body. The balloons were inflated to create a void to serve as a repository for the bone cement. Both balloons were deflated and through both cannulas, under continuous fluoroscopy guidance in the AP and lateral views, the vertebral body was filled with previously mixed polymethyl-methacrylate (PMMA) added to barium for opacification. Both cannulas were later removed. The access sites were cleaned and covered with a sterile bandage.  IMPRESSION: 1. Successful fluoroscopy-guided bilateral transpedicular approach for T10 and T12 vertebral body core bone biopsy and kyphoplasty for treatment of osteoporotic fragility fracture. Bone samples obtained were sent for pathology analysis. 2. If the patient has known osteoporosis, recommend treatment as clinically indicated. If the patient's bone density status is unknown, DEXA scan is recommended. Electronically Signed   By: Katyucia  de Macedo Rodrigues M.D.   On: 08/10/2023 15:32   IR KYPHO EA ADDL LEVEL THORACIC OR LUMBAR Result Date: 08/10/2023 INDICATION: 89 year old female with T10 and T12 presumed osteoporotic compression fractures and intractable back pain unresponsive to medical management. EXAM: FLUOROSCOPY GUIDED T10 AND T12 CORE BONE BIOPSY AND BALLOON KYPHOPLASTY COMPARISON:  MRI of the thoracic spine August 05, 2023. MEDICATIONS: As antibiotic prophylaxis, Ancef  2 g IV was ordered pre-procedure and administered intravenously within 1 hour of incision. All current medications are in the EMR and have been reviewed as part of this encounter. ANESTHESIA/SEDATION: Moderate (conscious) sedation was employed during this procedure. A total of Versed  2.5 mg, fentanyl  150 mcg and Toradol  15 mg were administered intravenously by the radiology nurse. Total intra-service moderate Sedation Time: 68 minutes. The patient's level of consciousness and vital signs were monitored continuously by radiology nursing throughout the procedure under my direct supervision. FLUOROSCOPY: Radiation Exposure Index (as provided by the fluoroscopic device): 2,768 mGy Kerma COMPLICATIONS: None immediate. PROCEDURE: Following a full explanation of the procedure along with the potential associated complications, an informed witnessed consent was obtained. The patient was placed in prone position on the angiography table. The thoracic spine region was prepped and draped in a sterile fashion. Under fluoroscopy, the T10  vertebral body was delineated and the skin area was marked. The skin was infiltrated with a 1% Lidocaine  approximately 3 cm lateral to the spinous process projection on the right. Using a 22-gauge spinal needle, the soft issue and the peripedicular space and periosteum were infiltrated with Bupivacaine  0.25%. A skin incision was made at the access site. Subsequently, an 11-gauge Kyphon trocar was inserted under fluoroscopic guidance until contact with the pedicle was obtained. The trocar was inserted under light hammer tapping into the pedicle until the posterior boundaries of the vertebral body was reached. The diamond mandrill was removed and one core biopsy wasobtained. The skin was infiltrated with  a 1% Lidocaine  approximately 3 cm lateral to the spinous process projection on the left. Using a 22-gauge spinal needle, the soft issue and the peripedicular space and periosteum were infiltrated with Bupivacaine  0.25%. A skin incision was made at the access site. Subsequently, an 11-gauge Kyphon trocar was inserted under fluoroscopic guidance until contact with the pedicle was obtained. The trocar was inserted under light hammer tapping into the pedicle until the posterior boundaries of the vertebral body was reached. The diamond mandrill was removed. A bone drill was coaxially advanced within the anterior third of the vertebral body and then exchanged for inflatable Kyphon balloons. These were centered within the mid-aspect of the vertebral body. The balloons were inflated to create a void to serve as a repository for the bone cement. Both balloons were deflated and through both cannulas, under continuous fluoroscopy guidance in the AP and lateral views, the vertebral body was filled with previously mixed polymethyl-methacrylate (PMMA) added to barium for opacification. Both cannulas were later removed. Under fluoroscopy, the T12 vertebral body was delineated and the skin area was marked. The skin was infiltrated  with a 1% Lidocaine  approximately 3.2 cm lateral to the spinous process projection on the right. Using a 22-gauge spinal needle, the soft issue and the peripedicular space and periosteum were infiltrated with Bupivacaine  0.25%. A skin incision was made at the access site. Subsequently, an 11-gauge Kyphon trocar was inserted under fluoroscopic guidance until contact with the pedicle was obtained. The trocar was inserted under light hammer tapping into the pedicle until the posterior boundaries of the vertebral body was reached. The diamond mandrill was removed and one core biopsy wasobtained. The skin was infiltrated with a 1% Lidocaine  approximately 3.2 cm lateral to the spinous process projection on the left. Using a 22-gauge spinal needle, the soft issue and the peripedicular space and periosteum were infiltrated with Bupivacaine  0.25%. A skin incision was made at the access site. Subsequently, an 11-gauge Kyphon trocar was inserted under fluoroscopic guidance until contact with the pedicle was obtained. The trocar was inserted under light hammer tapping into the pedicle until the posterior boundaries of the vertebral body was reached. The diamond mandrill was removed. A bone drill was coaxially advanced within the anterior third of the vertebral body and then exchanged for inflatable Kyphon balloons. These were centered within the mid-aspect of the vertebral body. The balloons were inflated to create a void to serve as a repository for the bone cement. Both balloons were deflated and through both cannulas, under continuous fluoroscopy guidance in the AP and lateral views, the vertebral body was filled with previously mixed polymethyl-methacrylate (PMMA) added to barium for opacification. Both cannulas were later removed. The access sites were cleaned and covered with a sterile bandage. IMPRESSION: 1. Successful fluoroscopy-guided bilateral transpedicular approach for T10 and T12 vertebral body core bone biopsy  and kyphoplasty for treatment of osteoporotic fragility fracture. Bone samples obtained were sent for pathology analysis. 2. If the patient has known osteoporosis, recommend treatment as clinically indicated. If the patient's bone density status is unknown, DEXA scan is recommended. Electronically Signed   By: Katyucia  de Macedo Rodrigues M.D.   On: 08/10/2023 15:32   Scheduled Meds:  amLODipine   10 mg Oral Daily   bisacodyl   10 mg Rectal Once   feeding supplement  237 mL Oral BID BM   gabapentin   100 mg Oral TID   lidocaine   1 patch Transdermal Daily   metoprolol  succinate  25 mg Oral QPM  multivitamin with minerals  1 tablet Oral Daily   pantoprazole   40 mg Oral Daily   polyethylene glycol  17 g Oral BID   pravastatin   20 mg Oral q1800   senna-docusate  1 tablet Oral BID   Continuous Infusions:   LOS: 3 days   Aura Leeds, DO [facility] Available via Epic secure chat 7am-7pm After these hours, please refer to coverage provider listed on [email or web address] 08/10/2023, 5:23 PM

## 2023-08-10 NOTE — Progress Notes (Signed)
 Physical Therapy Treatment Patient Details Name: Jessica [name] MRN: [id number] DOB: Oct 03, 1929 Today's Date: 08/10/2023   History of Present Illness 89 y.o. female presents to [facility] hospital on 08/04/2023 with severe back pain after recent fall. Pt found to have T12 compression fx. PMH includes HTN, OA, hiatal hernia, CKD, DMII.    PT Comments  Patient reports she is going to have kyphoplasty this afternoon and still agreeable to work with PT. Mobilizing at supervision to min assist level, however continues to need max cues to adhere to back precautions. Despite pre-medication for pain, pain increased from 8/10 to 10/10 by end of session. Patient assisted back to bed in sidelying for comfort.     If plan is discharge home, recommend the following: A little help with walking and/or transfers;A lot of help with bathing/dressing/bathroom;Assistance with cooking/housework;Assist for transportation;Help with stairs or ramp for entrance   Can travel by private vehicle        Equipment Recommendations  None recommended by PT    Recommendations for Other Services       Precautions / Restrictions Precautions Precautions: Fall;Back Precaution Booklet Issued: No Precaution Comments: verbally reviewed back precautions; required cues throughout to adhere Required Braces or Orthoses: Spinal Brace Spinal Brace: Thoracolumbosacral orthotic;Applied in sitting position Restrictions Weight Bearing Restrictions Per Provider Order: No     Mobility  Bed Mobility Overal bed mobility: Needs Assistance Bed Mobility: Sit to Sidelying, Rolling, Sidelying to Sit Rolling: Supervision, Used rails Sidelying to sit: Contact guard assist     Sit to sidelying: Min assist General bed mobility comments: vc to adhere to back precautions; assist to raise legs onto bed    Transfers Overall transfer level: Needs assistance Equipment used: Rolling walker (2 wheels) Transfers: Sit to/from Stand Sit to Stand:  Contact guard assist           General transfer comment: tends to push posterior legs against bed frame for leverage and stability    Ambulation/Gait Ambulation/Gait assistance: Contact guard assist Gait Distance (Feet): 70 Feet Assistive device: Rolling walker (2 wheels) Gait Pattern/deviations: Step-to pattern, Step-through pattern, Trunk flexed Gait velocity: reduced Gait velocity interpretation: <1.8 ft/sec, indicate of risk for recurrent falls   General Gait Details: pt tends to push RW too far ahead and then flexes torso; multiple cues to stay closer to RW with pt maintaining for brief periods   Stairs             Wheelchair Mobility     Tilt Bed    Modified Rankin (Stroke Patients Only)       Balance Overall balance assessment: Needs assistance Sitting-balance support: Feet supported, Single extremity supported Sitting balance-Leahy Scale: Fair       Standing balance-Leahy Scale: Fair                              Cognition Arousal: Alert Behavior During Therapy: WFL for tasks assessed/performed Overall Cognitive Status: Within Functional Limits for tasks assessed                                 General Comments: Great insight, VERY HOH        Exercises      General Comments General comments (skin integrity, edema, etc.): assisted to don TLSO sitting at EOB      Pertinent Vitals/Pain Pain Assessment Pain Assessment: 0-10 Pain Score: 10-Worst  pain ever Pain Location: back Pain Descriptors / Indicators: Aching Pain Intervention(s): Premedicated before session, Limited activity within patient's tolerance, Monitored during session, Repositioned    Home Living                          Prior Function            PT Goals (current goals can now be found in the care plan section) Acute Rehab PT Goals Patient Stated Goal: to reduce pain, improve ambulation Time For Goal Achievement: 08/19/23 Potential  to Achieve Goals: Fair Progress towards PT goals: Progressing toward goals    Frequency    Min 1X/week      PT Plan      Co-evaluation              AM-PAC PT "6 Clicks" Mobility   Outcome Measure  Help needed turning from your back to your side while in a flat bed without using bedrails?: A Little Help needed moving from lying on your back to sitting on the side of a flat bed without using bedrails?: A Little Help needed moving to and from a bed to a chair (including a wheelchair)?: A Little Help needed standing up from a chair using your arms (e.g., wheelchair or bedside chair)?: A Little Help needed to walk in hospital room?: A Little Help needed climbing 3-5 steps with a railing? : A Lot 6 Click Score: 17    End of Session Equipment Utilized During Treatment: Back brace Activity Tolerance: Patient limited by pain Patient left: with call bell/phone within reach;in bed;with bed alarm set (due to 10/10 pain wanted to return to bed/sidelying) Nurse Communication: Mobility status PT Visit Diagnosis: Other abnormalities of gait and mobility (R26.89);Muscle weakness (generalized) (M62.81);Pain Pain - part of body:  (back)     Time: 9147-8295 PT Time Calculation (min) (ACUTE ONLY): 17 min  Charges:    $Gait Training: 8-22 mins PT General Charges $$ ACUTE PT VISIT: 1 Visit                      Gayle Kava, PT Acute Rehabilitation Services  Office [phone]    Guilford Leep 08/10/2023, 11:37 AM

## 2023-08-10 NOTE — Procedures (Signed)
 INTERVENTIONAL NEURORADIOLOGY BRIEF POSTPROCEDURE NOTE  FLUOROSCOPY GUIDED T10 AND T12 BALLOON KYPHOPLASTY  Attending physician: Roetta Clarke, MD  Diagnosis: Compression fractures  Access site: Percutaneous bilateral transpedicular.  Anesthesia: IR sedation: Moderate sedation.  Medication used: 2.5 mg Versed  IV; 150 mcg Fentanyl  IV.  Complications: None.  Estimated blood loss: Minimal.  Specimen:  2 core biopsy sample.  Findings: Compression fractures, presumed osteoporotic. Bilateral transpedicular approach utilized for balloon kyphoplasty.  The patient tolerated the procedure well without incident or complication and is in stable condition.

## 2023-08-10 NOTE — Progress Notes (Signed)
Patient back to unit from IR

## 2023-08-10 NOTE — Progress Notes (Signed)
 Patient concerned about discharging home, specifically getting into and out of a vehicle, asked if she could have ambulance transport home. She is concerned about the weather and stated it would be easier for her and provide peace of mind for family if she had ambulance transport.

## 2023-08-10 NOTE — Plan of Care (Signed)

## 2023-08-10 NOTE — Consult Note (Signed)
 Chief Complaint: Severe back pain  Referring Provider(s): Aura Leeds latif  Supervising Physician: De Macedo Rodrigues, Katyucia  Patient Status: [facility] - In-pt  History of Present Illness: Jessica [name] is a 89 y.o. female with medical issues including HTN, osteoarthritis, hiatal hernia, diabetes, and CKD.  She presented to the ED on 08/04/23 c/o severe back pain after a fall.  She was initially seen at an Urgent Care and was given an injection.  She was taking Oxycodone  with little improvement.  At baseline, she is independent and does very well despite her advanced age.  She is states the pain is significantly limiting her mobility.  Initial imaging in the ED showed T12 vertebral compression deformity.  Subsequent MRI showed  1. Acute compression fracture of T12 with 30% loss of height relative to the adjacent level. 2. Acute compression fracture of T10 with 25% loss of height in the midbody of T10.  IR is asked to evaluate for vertebral augmentation.  She last ate at 7:30 this morning.   Patient is Full Code  Past Medical History:  Diagnosis Date   Allergy    Back pain    herniated disc   Basal cell carcinoma of face 11/2014   LEFT Temple   Cataracts, bilateral    bilateral removed   Colon polyps    Cystocele    Essential hypertension, benign    takes Amlodipine  and Prinizide daily   GERD (gastroesophageal reflux disease)    takes Nexium  daily   Glaucoma    pt denies   Hyperlipidemia    takes Pravastatin  daily   Impaired hearing    Incontinence    occasionally   Iron deficiency anemia due to chronic blood loss 02/16/2016   Due to chronic cameron ulcers. See GI notes. Continue PPI and iron supplementation.   Low bone mass 02/2008   Lymphocytic colitis    Osteoarthritis    hip, knee; severe   Osteoporosis    Postoperative anemia due to acute blood loss 09/26/2011   Type II or unspecified type diabetes mellitus without mention of complication,  not stated as uncontrolled 08/2010   Diet controlled    Past Surgical History:  Procedure Laterality Date   BASAL CELL CARCINOMA EXCISION Left 12/14/2014   LEFT temple   cataract surgery 04/25/13; 05/16/2013     COLONOSCOPY     JOINT REPLACEMENT N/A    Phreesia 07/29/2020   TOE SURGERY     bilateral   TONSILLECTOMY AND ADENOIDECTOMY     TOTAL HIP ARTHROPLASTY  09/24/2011   Procedure: TOTAL HIP ARTHROPLASTY;  Surgeon: Ferd Householder, MD;  Location: [facility] OR;  Service: Orthopedics;  Laterality: Right;    Allergies: Patient has no known allergies.  Medications: Prior to Admission medications   Medication Sig Start Date End Date Taking? Authorizing Provider  amLODipine  (NORVASC ) 10 MG tablet TAKE 1 TABLET (10 MG TOTAL) BY MOUTH DAILY. DX: I10 07/20/23  Yes Abraham Abo, MD  esomeprazole  (NEXIUM ) 20 MG capsule Take 1 capsule (20 mg total) by mouth daily at 12 noon. Patient taking differently: Take 20 mg by mouth every evening. 03/10/18  Yes Stallings, Zoe A, MD  GLUCOSAMINE PO Take 1 tablet by mouth every evening.   Yes [provider]  hydrochlorothiazide  (HYDRODIURIL ) 12.5 MG tablet TAKE 1 TABLET BY MOUTH EVERY DAY 07/20/23  Yes Abraham Abo, MD  HYDROcodone -acetaminophen  (NORCO) 7.5-325 MG tablet Take 1 tablet by mouth every 6 (six) hours as needed. Patient taking differently:  Take 1 tablet by mouth every 6 (six) hours as needed for moderate pain (pain score 4-6). 07/23/23  Yes Abraham Abo, MD  metoprolol  succinate (TOPROL -XL) 25 MG 24 hr tablet TAKE 1 TABLET (25 MG TOTAL) BY MOUTH DAILY. DX: I10 Patient taking differently: Take 25 mg by mouth every evening. 07/20/23  Yes Abraham Abo, MD  pravastatin  (PRAVACHOL ) 20 MG tablet TAKE 1 TABLET BY MOUTH EVERY DAY IN THE EVENING 07/23/23  Yes Abraham Abo, MD     Family History  Problem Relation Age of Onset   Stroke Mother 47   Hypertension Mother    Heart disease Father 59       Endocarditis   Rheumatic fever Father     Obesity Daughter        unable to walk very far   Arthritis Daughter    Anesthesia problems Neg Hx    Hypotension Neg Hx    Pseudochol deficiency Neg Hx    Malignant hyperthermia Neg Hx    Colon cancer Neg Hx    Esophageal cancer Neg Hx    Pancreatic cancer Neg Hx    Rectal cancer Neg Hx    Stomach cancer Neg Hx     Social History   Socioeconomic History   Marital status: Widowed    Spouse name: n/a   Number of children: 2   Years of education: 11th grade   Highest education level: 11th grade  Occupational History   Occupation: Retired Print production planner  Tobacco Use   Smoking status: Former    Current packs/day: 0.00    Types: Cigarettes    Quit date: 07/07/1995    Years since quitting: 28.1   Smokeless tobacco: Never  Vaping Use   Vaping status: Never Used  Substance and Sexual Activity   Alcohol use: No   Drug use: No   Sexual activity: Never  Other Topics Concern   Not on file  Social History Narrative   Widowed - retired Chief of Staff   Daughter (a retired Pensions consultant) lives with patient - she has medical problems related to obesity.   Her son and his family live across the street.   2 caffeine drinks/day      Social Drivers of Corporate investment banker Strain: Low Risk  (10/25/2022)   Overall Financial Resource Strain (CARDIA)    Difficulty of Paying Living Expenses: Not very hard  Food Insecurity: No Food Insecurity (08/04/2023)   Hunger Vital Sign    Worried About Running Out of Food in the Last Year: Never true    Ran Out of Food in the Last Year: Never true  Transportation Needs: No Transportation Needs (08/04/2023)   PRAPARE - Administrator, Civil Service (Medical): No    Lack of Transportation (Non-Medical): No  Physical Activity: Unknown (10/25/2022)   Exercise Vital Sign    Days of Exercise per Week: 0 days    Minutes of Exercise per Session: Not on file  Stress: No Stress Concern Present (10/25/2022)   [employer] of  Occupational Health - Occupational Stress Questionnaire    Feeling of Stress : Not at all  Social Connections: Socially Isolated (08/05/2023)   Social Connection and Isolation Panel [NHANES]    Frequency of Communication with Friends and Family: Never    Frequency of Social Gatherings with Friends and Family: Twice a week    Attends Religious Services: Never    Database administrator or Organizations: No    Attends  Club or Organization Meetings: Never    Marital Status: Widowed     Review of Systems: A 12 point ROS discussed and pertinent positives are indicated in the HPI above.  All other systems are negative.    Vital Signs: BP (!) 138/56 (BP Location: Left Arm)   Pulse 66   Temp 98.2 F (36.8 C) (Oral)   Resp 14   Ht 5\' 3"  (1.6 m)   Wt 165 lb 5.5 oz (75 kg)   SpO2 95%   BMI 29.29 kg/m   Advance Care Plan: The advanced care place/surrogate decision maker was discussed at the time of visit and the patient did not wish to discuss or was not able to name a surrogate decision maker or provide an advance care plan.  Physical Exam Vitals reviewed.  Constitutional:      Appearance: Normal appearance.  HENT:     Head: Normocephalic and atraumatic.  Eyes:     Extraocular Movements: Extraocular movements intact.  Cardiovascular:     Rate and Rhythm: Normal rate and regular rhythm.  Pulmonary:     Effort: Pulmonary effort is normal. No respiratory distress.     Breath sounds: Normal breath sounds.  Abdominal:     Palpations: Abdomen is soft.  Musculoskeletal:        General: Normal range of motion.     Cervical back: Normal range of motion.       Back:     Comments: Very tender to palpation. Severe pain with movement  Skin:    General: Skin is warm and dry.  Neurological:     General: No focal deficit present.     Mental Status: She is alert and oriented to person, place, and time.  Psychiatric:        Mood and Affect: Mood normal.        Behavior: Behavior normal.         Thought Content: Thought content normal.        Judgment: Judgment normal.     Imaging: DG Abd 1 View Result Date: 08/07/2023 CLINICAL DATA:  Abdominal distention. EXAM: ABDOMEN - 1 VIEW COMPARISON:  08/04/2023. FINDINGS: The bowel gas pattern is normal. No radio-opaque calculi or other acute radiographic abnormality are seen. Total hip arthroplasty changes are present on the right. There are degenerative changes in the thoracolumbar spine. IMPRESSION: Nonobstructive bowel-gas pattern. Electronically Signed   By: Wyvonnia Heimlich M.D.   On: 08/07/2023 22:23   MR THORACIC SPINE WO CONTRAST Result Date: 08/05/2023 CLINICAL DATA:  Compression fracture T12. EXAM: MRI THORACIC SPINE WITHOUT CONTRAST TECHNIQUE: Multiplanar, multisequence MR imaging of the thoracic spine was performed. No intravenous contrast was administered. COMPARISON:  CT of the lumbar spine 08/04/2023. FINDINGS: Alignment: Slight degenerative anterolisthesis is present at T1-2. No other significant listhesis is present. Upper thoracic kyphosis is slightly exaggerated. Vertebrae: The T12 compression fracture is present. 30% loss of height is present relative to the adjacent level. Minimal retropulsed bone is present along the superior endplate. Edema is present along the superior endplate of T10 with the compression fracture revealing 25% loss of height in the midbody of T10. Mild edematous endplate changes are present anteriorly at T6-7. No other acute fractures are present. Cord:  Normal signal and morphology. Paraspinal and other soft tissues: The visualized lung fields are clear. A large hiatal hernia is present. Mild dependent atelectasis present bilaterally. The upper abdomen is unremarkable. Disc levels: Significant disc disease is present in the upper thoracic spine. Bilateral  facet hypertrophy is noted at C7-T1 without focal stenosis. A broad-based disc protrusion is present at T11-12. No focal stenosis is present. T12-L1:  Broad-based disc protrusion is asymmetric to the left. Mild facet hypertrophy is noted bilaterally. No focal stenosis is present. Mild disc bulging facet hypertrophy is present at L1-2 without focal stenosis. IMPRESSION: 1. Acute compression fracture of T12 with 30% loss of height relative to the adjacent level. 2. Acute compression fracture of T10 with 25% loss of height in the midbody of T10. 3. Mild edematous endplate changes anteriorly at T6-7. 4. Multilevel spondylosis of the thoracic spine as described. 5. Large hiatal hernia. Electronically Signed   By: Audree Leas M.D.   On: 08/05/2023 21:34   CT L-SPINE NO CHARGE Result Date: 08/04/2023 CLINICAL DATA:  Back pain since a fall last week. EXAM: CT LUMBAR SPINE WITH CONTRAST TECHNIQUE: Technique: Multiplanar CT images of the lumbar spine were reconstructed from contemporary CT of the Abdomen and Pelvis. RADIATION DOSE REDUCTION: This exam was performed according to the departmental dose-optimization program which includes automated exposure control, adjustment of the mA and/or kV according to patient size and/or use of iterative reconstruction technique. CONTRAST:  No additional. COMPARISON:  Lumbar spine MRI 02/02/2006 FINDINGS: Segmentation: 5 lumbar type vertebrae. Alignment: Mild lumbar levoscoliosis. Trace retrolisthesis of L3 on L4. Vertebrae: T12 compression fracture with 35% vertebral body height loss centrally and recent appearance with visible fracture line. No significant retropulsion or posterior element fracture. Diffuse osteopenia. No destructive bone lesion. Paraspinal and other soft tissues: Paravertebral soft tissue edema at T12. Remainder of the abdomen and pelvis reported separately. Disc levels: Multilevel disc degeneration, most advanced on the right at L3-4 and L4-5. Mild right greater than left neural foraminal stenosis at L3-4 and mild-to-moderate right neural foraminal stenosis at L4-5. No evidence of high-grade spinal canal  stenosis. IMPRESSION: Recent T12 compression fracture with 35% height loss. Electronically Signed   By: Aundra Lee M.D.   On: 08/04/2023 15:29   CT Head Wo Contrast Result Date: 08/04/2023 CLINICAL DATA:  Head trauma, minor (Age >= 65y).  Recent fall. EXAM: CT HEAD WITHOUT CONTRAST TECHNIQUE: Contiguous axial images were obtained from the base of the skull through the vertex without intravenous contrast. RADIATION DOSE REDUCTION: This exam was performed according to the departmental dose-optimization program which includes automated exposure control, adjustment of the mA and/or kV according to patient size and/or use of iterative reconstruction technique. COMPARISON:  None Available. FINDINGS: Brain: There is no evidence of an acute infarct, intracranial hemorrhage, mass, or midline shift. Cerebral white matter hypodensities are nonspecific but compatible with mild chronic small vessel ischemic disease. There is mild cerebral atrophy. Slightly asymmetrically prominent extra-axial CSF over the left frontal convexity is without significant mass effect and is favored to be secondary to atrophy, although a very small subdural hygroma is not excluded. Vascular: Calcified atherosclerosis at the skull base. No hyperdense vessel. Skull: No acute fracture or suspicious osseous lesion. Sinuses/Orbits: Right sphenoid sinus mucous retention cyst. Clear mastoid air cells. Bilateral cataract extraction. Other: None. IMPRESSION: 1. No evidence of acute intracranial abnormality. 2. Mild chronic small vessel ischemic disease and cerebral atrophy. Electronically Signed   By: Aundra Lee M.D.   On: 08/04/2023 15:22   CT ABDOMEN PELVIS W CONTRAST Result Date: 08/04/2023 CLINICAL DATA:  Bowel obstruction suspected Abdominal pain, acute, nonlocalized EXAM: CT ABDOMEN AND PELVIS WITH CONTRAST TECHNIQUE: Multidetector CT imaging of the abdomen and pelvis was performed using the standard protocol following bolus  administration of  intravenous contrast. RADIATION DOSE REDUCTION: This exam was performed according to the departmental dose-optimization program which includes automated exposure control, adjustment of the mA and/or kV according to patient size and/or use of iterative reconstruction technique. CONTRAST:  75mL OMNIPAQUE  IOHEXOL  350 MG/ML SOLN COMPARISON:  None Available. FINDINGS: Lower chest: Moderate hiatal hernia. No pleural or pericardial effusion. Mild dependent atelectasis in the lung bases. Hepatobiliary: Gallbladder physiologically distended without calcified stones. Ectatic CBD 1.2 cm diameter. Minimal central intrahepatic biliary ductal ectasia. No focal liver lesion. Pancreas: Unremarkable. No pancreatic ductal dilatation or surrounding inflammatory changes. Spleen: Normal in size. Coarse subcapsular calcifications. No acute lesion. Adrenals/Urinary Tract: No adrenal mass. No urolithiasis. No hydronephrosis. 3 cm 9 HU probable cyst, left lower pole; no follow-up warranted. Urinary bladder is distended. Stomach/Bowel: Moderate hiatal hernia. Stomach is nondilated. Small bowel decompressed. Appendix not identified. The colon is partially distended, with scattered sigmoid diverticula; no adjacent inflammatory change. Vascular/Lymphatic: Moderate calcified aortoiliac plaque without aneurysm. Portal vein patent. No abdominal or pelvic adenopathy. Reproductive: Status post hysterectomy. No adnexal masses. Other: No ascites.  No free air. Musculoskeletal: Mild T12 vertebral compression deformity without significant retropulsion. Multilevel spondylitic change in the lumbar spine. IMPRESSION: 1. No acute findings. 2. Moderate hiatal hernia. 3. Sigmoid diverticulosis. Electronically Signed   By: Nicoletta Barrier M.D.   On: 08/04/2023 15:16   DG Knee Complete 4 Views Right Result Date: 08/04/2023 CLINICAL DATA:  Fall.  Right knee pain. EXAM: RIGHT KNEE - COMPLETE 4+ VIEW COMPARISON:  None Available. FINDINGS: There is diffuse decreased  bone mineralization. Severe patellofemoral joint space narrowing with diffuse bone-on-bone contact, subchondral sclerosis, and moderate peripheral osteophytosis. Tiny joint effusion. Mild medial and lateral compartment chondrocalcinosis without significant joint space narrowing. No definitive acute fracture line is seen. Mild atherosclerotic calcifications. IMPRESSION: 1. Severe patellofemoral osteoarthritis. 2. Tiny joint effusion. 3. No definitive acute fracture line is seen. Electronically Signed   By: Bertina Broccoli M.D.   On: 08/04/2023 12:18    Labs:  CBC: Recent Labs    08/07/23 1527 08/08/23 0539 08/09/23 0653 08/10/23 0540  WBC 13.9* 10.9* 11.3* 11.3*  HGB 11.8* 11.7* 12.0 11.2*  HCT 35.7* 34.9* 35.8* 34.0*  PLT 295 312 324 327    COAGS: No results for input(s): "INR", "APTT" in the last 8760 hours.  BMP: Recent Labs    08/07/23 0614 08/08/23 0539 08/09/23 0653 08/10/23 0540  NA 136 134* 133* 136  K 4.2 4.5 4.2 4.4  CL 100 99 100 101  CO2 22 22 22 22   GLUCOSE 110* 112* 101* 98  BUN 24* 18 21 36*  CALCIUM  9.4 9.3 9.4 9.0  CREATININE 1.06* 0.88 0.87 1.07*  GFRNONAA 50* >60 >60 49*    LIVER FUNCTION TESTS: Recent Labs    08/07/23 0614 08/08/23 0539 08/09/23 0653 08/10/23 0540  BILITOT 0.6 0.5 0.7 0.5  AST 23 19 20 16   ALT 16 15 15 15   ALKPHOS 70 65 65 61  PROT 7.8 7.4 7.4 6.8  ALBUMIN 3.6 3.3* 3.4* 3.2*    TUMOR MARKERS: No results for input(s): "AFPTM", "CEA", "CA199", "CHROMGRNA" in the last 8760 hours.  Assessment and Plan:  Severe back pain after a fall.  T10 and T12 compression fractures - symptomatic, significantly interfering with mobilty.  Will proceed with image guided kyphoplasty/vertebral augmentation today by Dr. Hershal Loron  Risks and benefits of kyphoplasty were discussed with the patient including, but not limited to education regarding the natural healing process  of compression fractures without intervention, bleeding,  infection, cement migration which may cause spinal cord damage, paralysis, pulmonary embolism or even death.  This interventional procedure involves the use of X-rays and because of the nature of the planned procedure, it is possible that we will have prolonged use of X-ray fluoroscopy.  Potential radiation risks to you include (but are not limited to) the following: - A slightly elevated risk for cancer  several years later in life. This risk is typically less than 0.5% percent. This risk is low in comparison to the normal incidence of human cancer, which is 33% for women and 50% for men according to the [employer] Society. - Radiation induced injury can include skin redness, resembling a rash, tissue breakdown / ulcers and hair loss (which can be temporary or permanent).   The likelihood of either of these occurring depends on the difficulty of the procedure and whether you are sensitive to radiation due to previous procedures, disease, or genetic conditions.   IF your procedure requires a prolonged use of radiation, you will be notified and given written instructions for further action.  It is your responsibility to monitor the irradiated area for the 2 weeks following the procedure and to notify your physician if you are concerned that you have suffered a radiation induced injury.    All of the patient's questions were answered, patient is agreeable to proceed.  Consent signed and in chart.  Thank you for allowing our service to participate in ALAYSHA ZIMMERER 's care.  Electronically Signed: Connor Deiters, PA-C   08/10/2023, 9:21 AM      I spent a total of 40 Minutes  in face to face in clinical consultation, greater than 50% of which was counseling/coordinating care for kyphoplasty.

## 2023-08-11 DIAGNOSIS — Z862 Personal history of diseases of the blood and blood-forming organs and certain disorders involving the immune mechanism: Secondary | ICD-10-CM | POA: Diagnosis not present

## 2023-08-11 DIAGNOSIS — K5900 Constipation, unspecified: Secondary | ICD-10-CM | POA: Diagnosis not present

## 2023-08-11 DIAGNOSIS — M545 Low back pain, unspecified: Secondary | ICD-10-CM | POA: Diagnosis not present

## 2023-08-11 DIAGNOSIS — D72829 Elevated white blood cell count, unspecified: Secondary | ICD-10-CM

## 2023-08-11 DIAGNOSIS — S22080A Wedge compression fracture of T11-T12 vertebra, initial encounter for closed fracture: Secondary | ICD-10-CM | POA: Diagnosis not present

## 2023-08-11 LAB — GLUCOSE, CAPILLARY
Glucose-Capillary: 95 mg/dL (ref 70–99)
Glucose-Capillary: 143 mg/dL — ABNORMAL HIGH (ref 70–99)
Glucose-Capillary: 142 mg/dL — ABNORMAL HIGH (ref 70–99)
Glucose-Capillary: 117 mg/dL — ABNORMAL HIGH (ref 70–99)

## 2023-08-11 LAB — CBC WITH DIFFERENTIAL/PLATELET
Abs Immature Granulocytes: 0.21 10*3/uL — ABNORMAL HIGH (ref 0.00–0.07)
Abs Immature Granulocytes: 0.27 10*3/uL — ABNORMAL HIGH (ref 0.00–0.07)
Basophils Absolute: 0.1 10*3/uL (ref 0.0–0.1)
Basophils Absolute: 0.1 10*3/uL (ref 0.0–0.1)
Basophils Relative: 1 %
Basophils Relative: 1 %
Eosinophils Absolute: 0.2 10*3/uL (ref 0.0–0.5)
Eosinophils Absolute: 0.2 10*3/uL (ref 0.0–0.5)
Eosinophils Relative: 2 %
Eosinophils Relative: 1 %
HCT: 34.5 % — ABNORMAL LOW (ref 36.0–46.0)
HCT: 34.9 % — ABNORMAL LOW (ref 36.0–46.0)
Hemoglobin: 11.4 g/dL — ABNORMAL LOW (ref 12.0–15.0)
Hemoglobin: 11.5 g/dL — ABNORMAL LOW (ref 12.0–15.0)
Immature Granulocytes: 2 %
Immature Granulocytes: 2 %
Lymphocytes Relative: 25 %
Lymphocytes Relative: 24 %
Lymphs Abs: 2.9 10*3/uL (ref 0.7–4.0)
Lymphs Abs: 3.4 10*3/uL (ref 0.7–4.0)
MCH: 30.2 pg (ref 26.0–34.0)
MCH: 29.8 pg (ref 26.0–34.0)
MCHC: 33 g/dL (ref 30.0–36.0)
MCHC: 33 g/dL (ref 30.0–36.0)
MCV: 91.3 fL (ref 80.0–100.0)
MCV: 90.4 fL (ref 80.0–100.0)
Monocytes Absolute: 1.2 10*3/uL — ABNORMAL HIGH (ref 0.1–1.0)
Monocytes Absolute: 1.4 10*3/uL — ABNORMAL HIGH (ref 0.1–1.0)
Monocytes Relative: 10 %
Monocytes Relative: 10 %
Neutro Abs: 7.1 10*3/uL (ref 1.7–7.7)
Neutro Abs: 8.9 10*3/uL — ABNORMAL HIGH (ref 1.7–7.7)
Neutrophils Relative %: 60 %
Neutrophils Relative %: 62 %
Platelets: 351 10*3/uL (ref 150–400)
Platelets: 354 10*3/uL (ref 150–400)
RBC: 3.78 MIL/uL — ABNORMAL LOW (ref 3.87–5.11)
RBC: 3.86 MIL/uL — ABNORMAL LOW (ref 3.87–5.11)
RDW: 14.7 % (ref 11.5–15.5)
RDW: 14.7 % (ref 11.5–15.5)
WBC: 11.6 10*3/uL — ABNORMAL HIGH (ref 4.0–10.5)
WBC: 14.1 10*3/uL — ABNORMAL HIGH (ref 4.0–10.5)
nRBC: 0 % (ref 0.0–0.2)
nRBC: 0 % (ref 0.0–0.2)

## 2023-08-11 LAB — COMPREHENSIVE METABOLIC PANEL
ALT: 14 U/L (ref 0–44)
AST: 23 U/L (ref 15–41)
Albumin: 3.3 g/dL — ABNORMAL LOW (ref 3.5–5.0)
Alkaline Phosphatase: 77 U/L (ref 38–126)
Anion gap: 15 (ref 5–15)
BUN: 35 mg/dL — ABNORMAL HIGH (ref 8–23)
CO2: 21 mmol/L — ABNORMAL LOW (ref 22–32)
Calcium: 9.3 mg/dL (ref 8.9–10.3)
Chloride: 100 mmol/L (ref 98–111)
Creatinine, Ser: 1.06 mg/dL — ABNORMAL HIGH (ref 0.44–1.00)
GFR, Estimated: 50 mL/min — ABNORMAL LOW (ref >60)
Glucose, Bld: 98 mg/dL (ref 70–99)
Potassium: 4.6 mmol/L (ref 3.5–5.1)
Sodium: 136 mmol/L (ref 135–145)
Total Bilirubin: 0.4 mg/dL (ref 0.0–1.2)
Total Protein: 6.9 g/dL (ref 6.5–8.1)

## 2023-08-11 LAB — MAGNESIUM: Magnesium: 2.1 mg/dL (ref 1.7–2.4)

## 2023-08-11 LAB — PHOSPHORUS: Phosphorus: 3.7 mg/dL (ref 2.5–4.6)

## 2023-08-11 MED ORDER — POLYETHYLENE GLYCOL 3350 17 G PO PACK: 17.0000 g | PACK | Freq: Every day | ORAL | 0 refills | Status: DC

## 2023-08-11 MED ORDER — SENNOSIDES-DOCUSATE SODIUM 8.6-50 MG PO TABS: 1.0000 | ORAL_TABLET | Freq: Every day | ORAL | 0 refills | Status: DC

## 2023-08-11 MED ORDER — ACETAMINOPHEN 325 MG PO TABS: 650.0000 mg | ORAL_TABLET | Freq: Four times a day (QID) | ORAL | 0 refills | Status: DC | PRN

## 2023-08-11 MED ORDER — ADULT MULTIVITAMIN W/MINERALS CH: 1.0000 | ORAL_TABLET | Freq: Every day | ORAL | 0 refills | Status: AC

## 2023-08-11 MED ORDER — SODIUM CHLORIDE 0.9 % IV SOLN: INTRAVENOUS | Status: DC

## 2023-08-11 MED ORDER — METHOCARBAMOL 500 MG PO TABS: 500.0000 mg | ORAL_TABLET | Freq: Three times a day (TID) | ORAL | 0 refills | Status: DC | PRN

## 2023-08-11 MED ORDER — ONDANSETRON HCL 4 MG PO TABS: 4.0000 mg | ORAL_TABLET | Freq: Four times a day (QID) | ORAL | 0 refills | Status: DC | PRN

## 2023-08-11 MED ORDER — LIDOCAINE 5 % EX PTCH: 1.0000 | MEDICATED_PATCH | Freq: Every day | CUTANEOUS | 0 refills | Status: DC

## 2023-08-11 MED ORDER — ENSURE ENLIVE PO LIQD: 237.0000 mL | Freq: Two times a day (BID) | ORAL | 12 refills | Status: DC

## 2023-08-11 MED ORDER — GABAPENTIN 100 MG PO CAPS: 100.0000 mg | ORAL_CAPSULE | Freq: Three times a day (TID) | ORAL | 0 refills | Status: DC

## 2023-08-11 MED ORDER — METHOCARBAMOL 500 MG PO TABS
500.0000 mg | ORAL_TABLET | Freq: Four times a day (QID) | ORAL | Status: DC | PRN
  Administered 2023-08-11 – 2023-08-12 (×3): 500 mg via ORAL
  Filled 2023-08-11 (×3): qty 1

## 2023-08-11 NOTE — Progress Notes (Signed)
Physical Therapy Treatment Patient Details Name: Jessica [name] MRN: [id number] DOB: Apr 13, 1930 Today's Date: 08/11/2023   History of Present Illness 89 y.o. female presents to [facility] hospital on 08/04/2023 with severe back pain after recent fall. Pt found to haveT10 and T12 compression fx. 2/10 kyphoplasties  PMH includes HTN, OA, hiatal hernia, CKD, DMII.    PT Comments  Patient reporting less pain s/p kyphoplasties (7/10 compared to 10/10 yesterday). Mobilizing well enough for discharge home today. Discussed her desire for medical transport home and she is set on this (see further details below). Notified Harvin Hazel, Case Manager.    If plan is discharge home, recommend the following: A little help with walking and/or transfers;A lot of help with bathing/dressing/bathroom;Assistance with cooking/housework;Assist for transportation;Help with stairs or ramp for entrance   Can travel by private vehicle        Equipment Recommendations  None recommended by PT    Recommendations for Other Services       Precautions / Restrictions Precautions Precautions: Fall;Back Precaution Booklet Issued: No Precaution/Restrictions Comments: verbally reviewed back precautions;cued only once to avoid flexion Required Braces or Orthoses: Spinal Brace Spinal Brace: Thoracolumbosacral orthotic;Applied in sitting position Restrictions Weight Bearing Restrictions Per Provider Order: No     Mobility  Bed Mobility Overal bed mobility: Needs Assistance Bed Mobility: Rolling, Sidelying to Sit Rolling: Supervision, Used rails Sidelying to sit: Contact guard assist       General bed mobility comments: allowed use of rail due to pt planning to sleep in her "deluxe recliner"    Transfers Overall transfer level: Needs assistance Equipment used: Rolling walker (2 wheels) Transfers: Sit to/from Stand Sit to Stand: Contact guard assist           General transfer comment: better forward translation over  her feet; discussed her concerns with car transfer (she had difficulty prior due to rt knee pain) and is convinced she will not be able to do it; she wants discharge home via medical transport    Ambulation/Gait Ambulation/Gait assistance: Contact guard assist Gait Distance (Feet): 80 Feet Assistive device: Rolling walker (2 wheels) Gait Pattern/deviations: Step-to pattern, Step-through pattern, Trunk flexed Gait velocity: reduced     General Gait Details: cued only once toward end of walk to get closer to RW due to flexion   Stairs             Wheelchair Mobility     Tilt Bed    Modified Rankin (Stroke Patients Only)       Balance Overall balance assessment: Needs assistance Sitting-balance support: Feet supported, Single extremity supported Sitting balance-Leahy Scale: Fair     Standing balance support: During functional activity Standing balance-Leahy Scale: Fair                              Hotel manager: Impaired Factors Affecting Communication: Hearing impaired  Cognition Arousal: Alert Behavior During Therapy: WFL for tasks assessed/performed                             Following commands: Intact      Cueing Cueing Techniques: Verbal cues  Exercises      General Comments        Pertinent Vitals/Pain Pain Assessment Pain Assessment: 0-10 Pain Score: 7  Pain Location: back Pain Descriptors / Indicators: Aching Pain Intervention(s): Limited activity within patient's tolerance, Premedicated before session  Home Living                          Prior Function            PT Goals (current goals can now be found in the care plan section) Acute Rehab PT Goals Patient Stated Goal: to reduce pain, improve ambulation PT Goal Formulation: With patient/family Time For Goal Achievement: 08/19/23 Potential to Achieve Goals: Fair Progress towards PT goals: Progressing toward  goals    Frequency    Min 1X/week      PT Plan      Co-evaluation              AM-PAC PT "6 Clicks" Mobility   Outcome Measure  Help needed turning from your back to your side while in a flat bed without using bedrails?: A Little Help needed moving from lying on your back to sitting on the side of a flat bed without using bedrails?: A Little Help needed moving to and from a bed to a chair (including a wheelchair)?: A Little Help needed standing up from a chair using your arms (e.g., wheelchair or bedside chair)?: A Little Help needed to walk in hospital room?: A Little Help needed climbing 3-5 steps with a railing? : A Lot 6 Click Score: 17    End of Session Equipment Utilized During Treatment: Back brace Activity Tolerance: No increased pain;Patient tolerated treatment well Patient left: with call bell/phone within reach;in bed;in chair;with chair alarm set (due to 10/10 pain wanted to return to bed/sidelying) Nurse Communication: Mobility status PT Visit Diagnosis: Other abnormalities of gait and mobility (R26.89);Muscle weakness (generalized) (M62.81);Pain Pain - part of body:  (back)     Time: 4098-1191 PT Time Calculation (min) (ACUTE ONLY): 18 min  Charges:    $Gait Training: 8-22 mins PT General Charges $$ ACUTE PT VISIT: 1 Visit                      Jerolyn Center, PT Acute Rehabilitation Services  Office [phone]    Zena Amos 08/11/2023, 8:54 AM

## 2023-08-11 NOTE — Care Management Important Message (Signed)
Important Message  Patient Details  Name: Jessica [name] MRN: [id number] Date of Birth: [birth year]   Important Message Given:  Yes - Medicare IM     Dorena Bodo 08/11/2023, 2:32 PM

## 2023-08-11 NOTE — Progress Notes (Signed)
PROGRESS NOTE    Jessica [name]  MWU:[id number] DOB: 1930/05/08 DOA: 08/04/2023 PCP: Georganna Skeans, MD   Brief Narrative:  The patient is a 89 year old elderly Caucasian female with past medical history significant for Mann[place] to hypertension, osteoarthritis, constipation, hiatal hernia, CKD stage IIIa, diabetes mellitus type 2 and other comorbidities who presented to the hospital with severe back pain after a fall.  She had a fall last week and was seen in urgent care yesterday and given an injection of some nausea medication.  She had no neurological signs and no unilateral weakness or numbness or loss of bowel or bladder function.  Further workup was done and she was found of a recent T12 compression fracture with 35% height loss.  She is given oxycodone, Zofran and morphine and pain minimally improved.  She was placed in a TLSO brace and given her continued pain interventional radiology was consulted for further evaluation for kyphoplasty and this was done on 08/09/22.  Hospitalization has been complicated by Constipation and now a persistent and worsening Leukocytosis. She was to be discharged today but given her worsening WBC will start IVF, Check Blood Cx x2, and CXR as well as Lactic Acid level and Procalcitionin Level as well as CRP and ESR.   Assessment and Plan:  Significant Back Pain with Radiculopathy -Secondary to the T10 and T12 compression fractures; See below -CT scan done and showed "Segmentation: 5 lumbar type vertebrae. Alignment: Mild lumbar levoscoliosis. Trace retrolisthesis of L3 on L4. Vertebrae: T12 compression fracture with 35% vertebral body height loss centrally and recent appearance with visible fracture line. No significant retropulsion or posterior element fracture. Diffuse osteopenia. No destructive bone lesion. Paraspinal and other soft tissues: Paravertebral soft tissue edema at T12. Remainder of the abdomen and pelvis reported separately. Disc levels:  Multilevel disc degeneration, most advanced on the right at L3-4 and L4-5. Mild right greater than left neural foraminal stenosis at L3-4 and mild-to-moderate right neural foraminal stenosis at L4-5. No evidence of high-grade spinal canal stenosis." -Pain management created and will continue acetaminophen 650 mg p.o./RC every 6 mild pain for pain score 1-3, hydrocodone-acetaminophen 1-2 tabs p.o. every 4 as needed for moderate pain and IV fentanyl 12.5 to 50 mcg IV q. to p.o. for severe pain -WBC Trend: Recent Labs  Lab 08/07/23 0614 08/07/23 1527 08/08/23 0539 08/09/23 0653 08/10/23 0540 08/11/23 0526 08/11/23 1548  WBC 15.7* 13.9* 10.9* 11.3* 11.3* 11.6* 14.1*  -TLSO brace  -Given continued Pain will consult IR to evaluate for Kyphoplasty and they are going to order an MRI for further evaluation -MRI done and showed "Acute compression fracture of T12 with 30% loss of height relative to the adjacent level. Acute compression fracture of T10 with 25% loss of height in the midbody of T10. Mild edematous endplate changes anteriorly at T6-7. Multilevel spondylosis of the thoracic spine as described. Large hiatal hernia." -Continue with the methocarbamol 500 mg IV every 6 as needed for muscle spasm and also add IV ketorolac 15 mg every 8 plan for moderate pain -May consider obtaining a L-spine MRI for further evaluation discussion with Neurosurgery -PT/OT recommending Home Health currently but will need to be reevaluated now after her kyphoplasty.   T10 and T12 Compression Fracture (HCC) -Pain control as above -Will consult IR as above and she is a candidate for kyphoplasty for both the T10 and T12 compression fractures; per interventional radiology they are checking her insurance authorization and will schedule a procedure once authorization has been obtained;  It has been obtained and they had time to take her down today and she is status post fluoroscopic guided T10 and T12 balloon kyphoplasty  and had 2 core biopsy samples. -The compression fracture resumed osteoporotic and a bilateral transpedicular approach was utilized with wound kyphoplasty  Cystocele -Bowel regimen initiated as below    HTN (Hypertension) -Continue Amlodipine 10 mg a day and resume home metoprolol succinate 25 mg p.o. daily -Continue to hold Hydrochlorothiazide 12.5 mg p.o. daily -Continue To monitor blood pressures per protocol -Last blood pressure reading was 134/53  Leukocytosis, was improving and worsened and persistent  -? Reactive in the setting of Pain -Will check for Infection  -WBC Trend:  Recent Labs  Lab 08/07/23 0614 08/07/23 1527 08/08/23 0539 08/09/23 0653 08/10/23 0540 08/11/23 0526 08/11/23 1548  WBC 15.7* 13.9* 10.9* 11.3* 11.3* 11.6* 14.1*  -Check U/A for Infection and relatively unremarkable and showed a clear appearance with yellow color urine, negative glucose, negative ketones, negative hemoglobin, small leukocytes, negative nitrites, rare bacteria, 0 to 5 cc per high-power field, 0-5 squamous epithelial cells and 0-5 WBCs -Since it has worsened will check Blood Cx x2 and CXR now as well as Lactic Acid Level, PCT, and CRP as well as ESR -Will hold D/C and will start IVF at 75 mL/hr -Repeat CBC in the AM and low threshold to start Abx   Hyperlipidemia -Continue oral Pravastatin 20 mg a day   Osteoarthritis -Check vitamin D level and was 44.73   Type 2 Diabetes Mellitus without complication, without long-term current use of insulin (HCC) -Order Sensitive SSI AC/HS -Check TSH and HgA1C -Holding Oral Diabetic Medications  -CBG Trend:  Recent Labs  Lab 08/10/23 0557 08/10/23 1134 08/10/23 1611 08/10/23 2109 08/11/23 0622 08/11/23 1215 08/11/23 1625  GLUCAP 88 104* 91 154* 95 143* 142*  -Glucose Level Trend: Recent Labs  Lab 08/05/23 0553 08/06/23 0905 08/07/23 7829 08/08/23 0539 08/09/23 0653 08/10/23 0540 08/11/23 0526  GLUCOSE 103* 123* 110* 112* 101* 98  98    Dehydration -IV fluid hydration has now stopped   Constipation -Bowel regimen was initiated with Bisacodyl 10 mg RC Daily PRN Moderate Consitpation and Docusate Sodium 100 mg po BID and Senna 8.6 mg po BID and Miralax 17 g po Daily PRN: Will change her bowel regimen around and then a little more aggressive and added senna docusate 1 tab p.o. twice daily and MiraLAX continuous p.o. twice daily along with her bisacodyl suppository  -KUB done and showed "The bowel gas pattern is normal. No radio-opaque calculi or other acute radiographic abnormality are seen. Total hip arthroplasty changes are present on the right. There are degenerative changes in the thoracolumbar spine." -She was also given a smog enema and additional suppository which caused her to have a large bowel movement yesterday and she is now feeling better   Hyponatremia -Na+ Trend: Recent Labs  Lab 08/05/23 0553 08/06/23 0905 08/07/23 0614 08/08/23 0539 08/09/23 0653 08/10/23 0540 08/11/23 0526  NA 133* 138 136 134* 133* 136 136  -Continue to Monitor and Trend and Repeat CMP in the AM  CKD Stage 3a, improved -BUN/Cr Trend: Recent Labs  Lab 08/05/23 0553 08/06/23 0905 08/07/23 0614 08/08/23 0539 08/09/23 0653 08/10/23 0540 08/11/23 0526  BUN 20 26* 24* 18 21 36* 35*  CREATININE 1.01* 1.20* 1.06* 0.88 0.87 1.07* 1.06*  -Avoid Nephrotoxic Medications, Contrast Dyes, Hypotension and Dehydration to Ensure Adequate Renal Perfusion and will need to Renally Adjust Meds -Continue to Monitor and Trend  Renal Function carefully and repeat CMP in the AM   Hypokalemia -Patient's K+ Level Trend: Recent Labs  Lab 08/05/23 0553 08/06/23 0905 08/07/23 0614 08/08/23 0539 08/09/23 0653 08/10/23 0540 08/11/23 0526  K 3.3* 4.9 4.2 4.5 4.2 4.4 4.6  -Continue to Monitor and Replete as Necessary -Repeat CMP in the AM   Normocytic Anemia/Hx of IDA -Hgb/Hct Trend: Recent Labs  Lab 08/07/23 0614 08/07/23 1527  08/08/23 0539 08/09/23 0653 08/10/23 0540 08/11/23 0526 08/11/23 1548  HGB 12.2 11.8* 11.7* 12.0 11.2* 11.4* 11.5*  HCT 37.1 35.7* 34.9* 35.8* 34.0* 34.5* 34.9*  MCV 90.7 90.6 89.9 88.8 90.2 91.3 90.4  -Checked Anemia Panel showed an iron level of 39, UIBC of 249, TIBC of 288, saturation ratios of 14%, ferritin of 121, folate level of 38.9 and a vitamin B12 of 3316; transfuse for Hg <7 , rapidly dropping or  if symptomatic -Continue to Monitor for S/Sx of Bleeding; No overt bleeding noted -Repeat CBC in the AM  Right Knee Pain -Right knee DG done and showed " Severe patellofemoral osteoarthritis. Tiny joint effusion. No definitive acute fracture line is seen." -Discussed with orthopedic surgery PA Earney Hamburg and he injected her knee with 40 mg of Solu-Medrol and 5 mL of 0.5% Marcaine -Orthopedic Surgery recommending outpatient follow-up with Dr. Eulah Pont in 1 to 2 weeks  Hypoalbuminemia -Patient's Albumin Trend: Recent Labs  Lab 08/05/23 0553 08/06/23 0905 08/07/23 7829 08/08/23 0539 08/09/23 0653 08/10/23 0540 08/11/23 0526  ALBUMIN 3.2* 3.6 3.6 3.3* 3.4* 3.2* 3.3*  -Continue to Monitor and Trend and repeat CMP in the AM  Overweight -Complicates overall prognosis and care -Estimated body mass index is 29.29 kg/m as calculated from the following:   Height as of this encounter: 5\' 3"  (1.6 m).   Weight as of this encounter: 75 kg.  -Weight Loss and Dietary Counseling given -Dietitian consulted and recommended liberalizing diet and adding Ensure half plus protein p.o. twice daily and multivitamin with minerals   DVT prophylaxis: SCDs Start: 08/04/23 2130    Code Status: Full Code Family Communication: Discussed with Daughter Arline Asp over the telephone  Disposition Plan:  Level of care: Telemetry Medical Status is: Inpatient Remains inpatient appropriate because: Was scheduled to be discharged today Home with Home Health but will hold D/C due to worsening Leukocytosis  and will further workup and place on IVF   Consultants:  IR Ortho for Knee Injection  Procedures:  As delineated as above   FLUOROSCOPY GUIDED T10 AND T12 BALLOON KYPHOPLASTY by Dr. Tommie Sams  Antimicrobials:  Anti-infectives (From admission, onward)    Start     Dose/Rate Route Frequency Ordered Stop   08/10/23 1333  ceFAZolin (ANCEF) IVPB 2g/100 mL premix        over 30 Minutes Intravenous Continuous PRN 08/10/23 1340 08/10/23 1357       Subjective: Seen and examined at bedside and she was doing okay.  Still having some pain but states it is better.  Hoping to go home but WBC worsened so we will hold discharge and workup with further investigation.  Will start her on IV fluids.  States that she is having bowel movements now.  No other concerns or complaints at this time.  Objective: Vitals:   08/11/23 0415 08/11/23 0750 08/11/23 1220 08/11/23 1554  BP: (!) 123/52 131/64 128/68 (!) 134/53  Pulse: (!) 59 78 90 94  Resp: 17 20 20 18   Temp: (!) 97.5 F (36.4 C) 98.9 F (37.2 C) 97.7  F (36.5 C) 99.6 F (37.6 C)  TempSrc:  Oral Oral Oral  SpO2: 93% 96% 96% 96%  Weight:      Height:        Intake/Output Summary (Last 24 hours) at 08/11/2023 1728 Last data filed at 08/11/2023 1610 Gross per 24 hour  Intake --  Output 650 ml  Net -650 ml   Filed Weights   08/04/23 1009 08/04/23 2344  Weight: 75 kg 75 kg   Examination: Physical Exam:  Constitutional: WN/WD elderly overweight Caucasian female who appears calm examined at bedside chair with her TLSO brace on and complaining of some pain. Respiratory: Diminished to auscultation bilaterally, no wheezing, rales, rhonchi or crackles. Normal respiratory effort and patient is not tachypenic. No accessory muscle use.  Unlabored breathing Cardiovascular: RRR, no murmurs / rubs / gallops. S1 and S2 auscultated.  Has some mild lower extremity edema.  Abdomen: Soft, non-tender, distended secondary to body habitus. Bowel  sounds positive.  GU: Deferred. Musculoskeletal: No clubbing / cyanosis of digits/nails. No joint deformity upper and lower extremities.  Wearing TLSO brace Skin: No rashes, lesions, ulcers on limited skin evaluation. No induration; Warm and dry.  Neurologic: CN 2-12 grossly intact with no focal deficits. Romberg sign and cerebellar reflexes not assessed.  Psychiatric: Normal judgment and insight. Alert and oriented x 3. Normal mood and appropriate affect.   Data Reviewed: I have personally reviewed following labs and imaging studies  CBC: Recent Labs  Lab 08/08/23 0539 08/09/23 0653 08/10/23 0540 08/11/23 0526 08/11/23 1548  WBC 10.9* 11.3* 11.3* 11.6* 14.1*  NEUTROABS 8.1* 6.7 6.0 7.1 8.9*  HGB 11.7* 12.0 11.2* 11.4* 11.5*  HCT 34.9* 35.8* 34.0* 34.5* 34.9*  MCV 89.9 88.8 90.2 91.3 90.4  PLT 312 324 327 351 354   Basic Metabolic Panel: Recent Labs  Lab 08/07/23 0614 08/08/23 0539 08/09/23 0653 08/10/23 0540 08/11/23 0526  NA 136 134* 133* 136 136  K 4.2 4.5 4.2 4.4 4.6  CL 100 99 100 101 100  CO2 22 22 22 22  21*  GLUCOSE 110* 112* 101* 98 98  BUN 24* 18 21 36* 35*  CREATININE 1.06* 0.88 0.87 1.07* 1.06*  CALCIUM 9.4 9.3 9.4 9.0 9.3  MG 2.1 1.9 2.0 2.2 2.1  PHOS 3.7 3.8 3.6 4.4 3.7   GFR: Estimated Creatinine Clearance: 33.5 mL/min (A) (by C-G formula based on SCr of 1.06 mg/dL (H)). Liver Function Tests: Recent Labs  Lab 08/07/23 0614 08/08/23 0539 08/09/23 0653 08/10/23 0540 08/11/23 0526  AST 23 19 20 16 23   ALT 16 15 15 15 14   ALKPHOS 70 65 65 61 77  BILITOT 0.6 0.5 0.7 0.5 0.4  PROT 7.8 7.4 7.4 6.8 6.9  ALBUMIN 3.6 3.3* 3.4* 3.2* 3.3*   No results for input(s): "LIPASE", "AMYLASE" in the last 168 hours. No results for input(s): "AMMONIA" in the last 168 hours. Coagulation Profile: Recent Labs  Lab 08/10/23 1057  INR 1.1   Cardiac Enzymes: Recent Labs  Lab 08/04/23 2107  CKTOTAL 60   BNP (last 3 results) No results for input(s): "PROBNP"  in the last 8760 hours. HbA1C: No results for input(s): "HGBA1C" in the last 72 hours. CBG: Recent Labs  Lab 08/10/23 1611 08/10/23 2109 08/11/23 0622 08/11/23 1215 08/11/23 1625  GLUCAP 91 154* 95 143* 142*   Lipid Profile: No results for input(s): "CHOL", "HDL", "LDLCALC", "TRIG", "CHOLHDL", "LDLDIRECT" in the last 72 hours. Thyroid Function Tests: No results for input(s): "TSH", "T4TOTAL", "FREET4", "T3FREE", "THYROIDAB"  in the last 72 hours. Anemia Panel: No results for input(s): "VITAMINB12", "FOLATE", "FERRITIN", "TIBC", "IRON", "RETICCTPCT" in the last 72 hours. Sepsis Labs: No results for input(s): "PROCALCITON", "LATICACIDVEN" in the last 168 hours.  No results found for this or any previous visit (from the past 240 hours).   Radiology Studies: IR KYPHO THORACIC WITH BONE BIOPSY Result Date: 08/10/2023 INDICATION: 89 year old female with T10 and T12 presumed osteoporotic compression fractures and intractable back pain unresponsive to medical management. EXAM: FLUOROSCOPY GUIDED T10 AND T12 CORE BONE BIOPSY AND BALLOON KYPHOPLASTY COMPARISON:  MRI of the thoracic spine August 05, 2023. MEDICATIONS: As antibiotic prophylaxis, Ancef 2 g IV was ordered pre-procedure and administered intravenously within 1 hour of incision. All current medications are in the EMR and have been reviewed as part of this encounter. ANESTHESIA/SEDATION: Moderate (conscious) sedation was employed during this procedure. A total of Versed 2.5 mg, fentanyl 150 mcg and Toradol 15 mg were administered intravenously by the radiology nurse. Total intra-service moderate Sedation Time: 68 minutes. The patient's level of consciousness and vital signs were monitored continuously by radiology nursing throughout the procedure under my direct supervision. FLUOROSCOPY: Radiation Exposure Index (as provided by the fluoroscopic device): 2,768 mGy Kerma COMPLICATIONS: None immediate. PROCEDURE: Following a full explanation of  the procedure along with the potential associated complications, an informed witnessed consent was obtained. The patient was placed in prone position on the angiography table. The thoracic spine region was prepped and draped in a sterile fashion. Under fluoroscopy, the T10 vertebral body was delineated and the skin area was marked. The skin was infiltrated with a 1% Lidocaine approximately 3 cm lateral to the spinous process projection on the right. Using a 22-gauge spinal needle, the soft issue and the peripedicular space and periosteum were infiltrated with Bupivacaine 0.25%. A skin incision was made at the access site. Subsequently, an 11-gauge Kyphon trocar was inserted under fluoroscopic guidance until contact with the pedicle was obtained. The trocar was inserted under light hammer tapping into the pedicle until the posterior boundaries of the vertebral body was reached. The diamond mandrill was removed and one core biopsy wasobtained. The skin was infiltrated with a 1% Lidocaine approximately 3 cm lateral to the spinous process projection on the left. Using a 22-gauge spinal needle, the soft issue and the peripedicular space and periosteum were infiltrated with Bupivacaine 0.25%. A skin incision was made at the access site. Subsequently, an 11-gauge Kyphon trocar was inserted under fluoroscopic guidance until contact with the pedicle was obtained. The trocar was inserted under light hammer tapping into the pedicle until the posterior boundaries of the vertebral body was reached. The diamond mandrill was removed. A bone drill was coaxially advanced within the anterior third of the vertebral body and then exchanged for inflatable Kyphon balloons. These were centered within the mid-aspect of the vertebral body. The balloons were inflated to create a void to serve as a repository for the bone cement. Both balloons were deflated and through both cannulas, under continuous fluoroscopy guidance in the AP and lateral  views, the vertebral body was filled with previously mixed polymethyl-methacrylate (PMMA) added to barium for opacification. Both cannulas were later removed. Under fluoroscopy, the T12 vertebral body was delineated and the skin area was marked. The skin was infiltrated with a 1% Lidocaine approximately 3.2 cm lateral to the spinous process projection on the right. Using a 22-gauge spinal needle, the soft issue and the peripedicular space and periosteum were infiltrated with Bupivacaine 0.25%. A skin incision  was made at the access site. Subsequently, an 11-gauge Kyphon trocar was inserted under fluoroscopic guidance until contact with the pedicle was obtained. The trocar was inserted under light hammer tapping into the pedicle until the posterior boundaries of the vertebral body was reached. The diamond mandrill was removed and one core biopsy wasobtained. The skin was infiltrated with a 1% Lidocaine approximately 3.2 cm lateral to the spinous process projection on the left. Using a 22-gauge spinal needle, the soft issue and the peripedicular space and periosteum were infiltrated with Bupivacaine 0.25%. A skin incision was made at the access site. Subsequently, an 11-gauge Kyphon trocar was inserted under fluoroscopic guidance until contact with the pedicle was obtained. The trocar was inserted under light hammer tapping into the pedicle until the posterior boundaries of the vertebral body was reached. The diamond mandrill was removed. A bone drill was coaxially advanced within the anterior third of the vertebral body and then exchanged for inflatable Kyphon balloons. These were centered within the mid-aspect of the vertebral body. The balloons were inflated to create a void to serve as a repository for the bone cement. Both balloons were deflated and through both cannulas, under continuous fluoroscopy guidance in the AP and lateral views, the vertebral body was filled with previously mixed polymethyl-methacrylate  (PMMA) added to barium for opacification. Both cannulas were later removed. The access sites were cleaned and covered with a sterile bandage. IMPRESSION: 1. Successful fluoroscopy-guided bilateral transpedicular approach for T10 and T12 vertebral body core bone biopsy and kyphoplasty for treatment of osteoporotic fragility fracture. Bone samples obtained were sent for pathology analysis. 2. If the patient has known osteoporosis, recommend treatment as clinically indicated. If the patient's bone density status is unknown, DEXA scan is recommended. Electronically Signed   By: Baldemar Lenis M.D.   On: 08/10/2023 15:32   IR KYPHO EA ADDL LEVEL THORACIC OR LUMBAR Result Date: 08/10/2023 INDICATION: 89 year old female with T10 and T12 presumed osteoporotic compression fractures and intractable back pain unresponsive to medical management. EXAM: FLUOROSCOPY GUIDED T10 AND T12 CORE BONE BIOPSY AND BALLOON KYPHOPLASTY COMPARISON:  MRI of the thoracic spine August 05, 2023. MEDICATIONS: As antibiotic prophylaxis, Ancef 2 g IV was ordered pre-procedure and administered intravenously within 1 hour of incision. All current medications are in the EMR and have been reviewed as part of this encounter. ANESTHESIA/SEDATION: Moderate (conscious) sedation was employed during this procedure. A total of Versed 2.5 mg, fentanyl 150 mcg and Toradol 15 mg were administered intravenously by the radiology nurse. Total intra-service moderate Sedation Time: 68 minutes. The patient's level of consciousness and vital signs were monitored continuously by radiology nursing throughout the procedure under my direct supervision. FLUOROSCOPY: Radiation Exposure Index (as provided by the fluoroscopic device): 2,768 mGy Kerma COMPLICATIONS: None immediate. PROCEDURE: Following a full explanation of the procedure along with the potential associated complications, an informed witnessed consent was obtained. The patient was placed in  prone position on the angiography table. The thoracic spine region was prepped and draped in a sterile fashion. Under fluoroscopy, the T10 vertebral body was delineated and the skin area was marked. The skin was infiltrated with a 1% Lidocaine approximately 3 cm lateral to the spinous process projection on the right. Using a 22-gauge spinal needle, the soft issue and the peripedicular space and periosteum were infiltrated with Bupivacaine 0.25%. A skin incision was made at the access site. Subsequently, an 11-gauge Kyphon trocar was inserted under fluoroscopic guidance until contact with the pedicle was obtained.  The trocar was inserted under light hammer tapping into the pedicle until the posterior boundaries of the vertebral body was reached. The diamond mandrill was removed and one core biopsy wasobtained. The skin was infiltrated with a 1% Lidocaine approximately 3 cm lateral to the spinous process projection on the left. Using a 22-gauge spinal needle, the soft issue and the peripedicular space and periosteum were infiltrated with Bupivacaine 0.25%. A skin incision was made at the access site. Subsequently, an 11-gauge Kyphon trocar was inserted under fluoroscopic guidance until contact with the pedicle was obtained. The trocar was inserted under light hammer tapping into the pedicle until the posterior boundaries of the vertebral body was reached. The diamond mandrill was removed. A bone drill was coaxially advanced within the anterior third of the vertebral body and then exchanged for inflatable Kyphon balloons. These were centered within the mid-aspect of the vertebral body. The balloons were inflated to create a void to serve as a repository for the bone cement. Both balloons were deflated and through both cannulas, under continuous fluoroscopy guidance in the AP and lateral views, the vertebral body was filled with previously mixed polymethyl-methacrylate (PMMA) added to barium for opacification. Both  cannulas were later removed. Under fluoroscopy, the T12 vertebral body was delineated and the skin area was marked. The skin was infiltrated with a 1% Lidocaine approximately 3.2 cm lateral to the spinous process projection on the right. Using a 22-gauge spinal needle, the soft issue and the peripedicular space and periosteum were infiltrated with Bupivacaine 0.25%. A skin incision was made at the access site. Subsequently, an 11-gauge Kyphon trocar was inserted under fluoroscopic guidance until contact with the pedicle was obtained. The trocar was inserted under light hammer tapping into the pedicle until the posterior boundaries of the vertebral body was reached. The diamond mandrill was removed and one core biopsy wasobtained. The skin was infiltrated with a 1% Lidocaine approximately 3.2 cm lateral to the spinous process projection on the left. Using a 22-gauge spinal needle, the soft issue and the peripedicular space and periosteum were infiltrated with Bupivacaine 0.25%. A skin incision was made at the access site. Subsequently, an 11-gauge Kyphon trocar was inserted under fluoroscopic guidance until contact with the pedicle was obtained. The trocar was inserted under light hammer tapping into the pedicle until the posterior boundaries of the vertebral body was reached. The diamond mandrill was removed. A bone drill was coaxially advanced within the anterior third of the vertebral body and then exchanged for inflatable Kyphon balloons. These were centered within the mid-aspect of the vertebral body. The balloons were inflated to create a void to serve as a repository for the bone cement. Both balloons were deflated and through both cannulas, under continuous fluoroscopy guidance in the AP and lateral views, the vertebral body was filled with previously mixed polymethyl-methacrylate (PMMA) added to barium for opacification. Both cannulas were later removed. The access sites were cleaned and covered with a  sterile bandage. IMPRESSION: 1. Successful fluoroscopy-guided bilateral transpedicular approach for T10 and T12 vertebral body core bone biopsy and kyphoplasty for treatment of osteoporotic fragility fracture. Bone samples obtained were sent for pathology analysis. 2. If the patient has known osteoporosis, recommend treatment as clinically indicated. If the patient's bone density status is unknown, DEXA scan is recommended. Electronically Signed   By: Baldemar Lenis M.D.   On: 08/10/2023 15:32   Scheduled Meds:  amLODipine  10 mg Oral Daily   bisacodyl  10 mg Rectal Once  feeding supplement  237 mL Oral BID BM   gabapentin  100 mg Oral TID   lidocaine  1 patch Transdermal Daily   metoprolol succinate  25 mg Oral QPM   multivitamin with minerals  1 tablet Oral Daily   pantoprazole  40 mg Oral Daily   polyethylene glycol  17 g Oral BID   pravastatin  20 mg Oral q1800   senna-docusate  1 tablet Oral BID   Continuous Infusions:  sodium chloride      LOS: 4 days   Marguerita Merles, DO [facility] Available via Epic secure chat 7am-7pm After these hours, please refer to coverage provider listed on [email or web address] 08/11/2023, 5:28 PM

## 2023-08-11 NOTE — TOC Transition Note (Signed)
Transition of Care [facility]) - Discharge Note   Patient Details  Name: Jessica [name] MRN: [id number] Date of Birth: 1929/11/15  Transition of Care [facility] CM/SW Contact:  Kermit Balo, RN Phone Number: 08/11/2023, 4:00 PM   Clinical Narrative:     Pt to discharge home today. She is requesting PTAR due to her weakness.  Frances Furbish has been arranged for [facility] therapies.  PTAR will be arranged after her CBC results this pm. Number left for staff to call and arrange PTAR.  D/c packet is at the desk.   Final next level of care: Home w Home Health Services Barriers to Discharge: No Barriers Identified   Patient Goals and CMS Choice   CMS Medicare.gov Compare Post Acute Care list provided to:: Patient Choice offered to / list presented to : Patient, Adult Children      Discharge Placement                       Discharge Plan and Services Additional resources added to the After Visit Summary for     Discharge Planning Services: CM Consult Post Acute Care Choice: Home Health                    HH Arranged: PT, OT [facility] Agency: [facility] Date [facility] Agency Contacted: 08/05/23   Representative spoke with at [facility] Agency: Kandee Keen  Social Drivers of Health (SDOH) Interventions SDOH Screenings   Food Insecurity: No Food Insecurity (08/04/2023)  Housing: Patient Declined (08/04/2023)  Transportation Needs: No Transportation Needs (08/04/2023)  Utilities: Patient Declined (08/04/2023)  Depression (PHQ2-9): Low Risk  (03/03/2023)  Financial Resource Strain: Low Risk  (10/25/2022)  Physical Activity: Unknown (10/25/2022)  Social Connections: Socially Isolated (08/05/2023)  Stress: No Stress Concern Present (10/25/2022)  Tobacco Use: Medium Risk (08/04/2023)     Readmission Risk Interventions     No data to display

## 2023-08-11 NOTE — Progress Notes (Signed)
Occupational Therapy Treatment Patient Details Name: Jessica [name] MRN: [id number] DOB: Dec 20, 1929 Today's Date: 08/11/2023   History of present illness 89 y.o. female presents to [facility] hospital on 08/04/2023 with severe back pain after recent fall. Pt found to haveT10 and T12 compression fx. 2/10 balloon kyphoplasty.  PMH includes HTN, OA, hiatal hernia, CKD, DMII.   OT comments  Jessica [name] was seen s/p balloon hyphoplasty and is making good progress towards their acute OT goals. Pt reported 5/10 pain at the start of the session and there was no overt increase in pain during functional mobility or ADLs. She continues to benefit form cues to maintain spinal precautions but did not need physical assist for transfers, short mobility with RW, toileting, hygiene or grooming at the sink. Total A needed to don TLSO and  review appropriate wear schedule. OT to continue to follow acutely to facilitate progress towards established goals. Pt will continue to benefit from [facility] and increased caregiver support for daily ADLs.       If plan is discharge home, recommend the following:  A little help with walking and/or transfers;A lot of help with bathing/dressing/bathroom;Assistance with cooking/housework;Assist for transportation;Help with stairs or ramp for entrance   Equipment Recommendations  None recommended by OT       Precautions / Restrictions Precautions Precautions: Fall;Back Precaution Booklet Issued: No Precaution/Restrictions Comments: verbally reviewed back precautions;cued only once to avoid flexion Required Braces or Orthoses: Spinal Brace Spinal Brace: Thoracolumbosacral orthotic;Applied in sitting position Restrictions Weight Bearing Restrictions Per Provider Order: No       Mobility Bed Mobility Overal bed mobility: Needs Assistance             General bed mobility comments: OOB upon arrival    Transfers Overall transfer level: Needs assistance Equipment used: Rolling  walker (2 wheels) Transfers: Sit to/from Stand Sit to Stand: Supervision           General transfer comment: from toilet and recliner     Balance Overall balance assessment: Needs assistance Sitting-balance support: Feet supported, Single extremity supported Sitting balance-Leahy Scale: Fair     Standing balance support: During functional activity Standing balance-Leahy Scale: Fair                             ADL either performed or assessed with clinical judgement   ADL Overall ADL's : Needs assistance/impaired     Grooming: Supervision/safety;Wash/dry hands;Standing Grooming Details (indicate cue type and reason): at the sink           Upper Body Dressing Details (indicate cue type and reason): total A to don brace, reviewed TLSO schedule     Toilet Transfer: Supervision/safety;Ambulation;Rolling walker (2 wheels)   Toileting- Clothing Manipulation and Hygiene: Supervision/safety;Sit to/from stand Toileting - Clothing Manipulation Details (indicate cue type and reason): rear peri care in standing     Functional mobility during ADLs: Supervision/safety;Rolling walker (2 wheels);Cueing for safety General ADL Comments: no overt increase in pain throughout session    Extremity/Trunk Assessment Upper Extremity Assessment Upper Extremity Assessment: Generalized weakness   Lower Extremity Assessment Lower Extremity Assessment: Defer to PT evaluation        Vision   Vision Assessment?: No apparent visual deficits   Perception Perception Perception: Not tested   Praxis Praxis Praxis: Not tested   Communication Communication Communication: Impaired Factors Affecting Communication: Hearing impaired   Cognition Arousal: Alert Behavior During Therapy: WFL for tasks assessed/performed Cognition: No apparent  impairments             OT - Cognition Comments: good recall of back precuations but benefits from cues during functional tasks                  Following commands: Intact                 General Comments VSS    Pertinent Vitals/ Pain       Pain Assessment Pain Assessment: 0-10 Pain Score: 5  Pain Location: back Pain Descriptors / Indicators: Aching Pain Intervention(s): Limited activity within patient's tolerance, Monitored during session         Frequency  Min 1X/week        Progress Toward Goals  OT Goals(current goals can now be found in the care plan section)  Progress towards OT goals: Progressing toward goals  Acute Rehab OT Goals Patient Stated Goal: to go home today OT Goal Formulation: With patient Time For Goal Achievement: 08/19/23 Potential to Achieve Goals: Good ADL Goals Pt Will Perform Lower Body Dressing: with modified independence;sit to/from stand;with adaptive equipment Pt Will Transfer to Toilet: with modified independence;ambulating Additional ADL Goal #1: Pt will tolerate at least 10 minutes of OOB functional activity to demonstrate improved tolerance for ADLs at home Additional ADL Goal #2: Pt will don/doff TLSO with minimal verbal cues only   AM-PAC OT "6 Clicks" Daily Activity     Outcome Measure     Help from another person taking care of personal grooming?: A Little Help from another person toileting, which includes using toliet, bedpan, or urinal?: A Little Help from another person bathing (including washing, rinsing, drying)?: A Lot Help from another person to put on and taking off regular upper body clothing?: A Little Help from another person to put on and taking off regular lower body clothing?: A Lot 6 Click Score: 13    End of Session Equipment Utilized During Treatment: Back brace;Rolling walker (2 wheels)  OT Visit Diagnosis: Unsteadiness on feet (R26.81);Other abnormalities of gait and mobility (R26.89);Muscle weakness (generalized) (M62.81);History of falling (Z91.81);Pain   Activity Tolerance Patient tolerated treatment well   Patient Left in  chair;with call bell/phone within reach;with chair alarm set   Nurse Communication Mobility status        Time: 1000-1018 OT Time Calculation (min): 18 min  Charges: OT General Charges $OT Visit: 1 Visit OT Treatments $Self Care/Home Management : 8-22 mins  Derenda Mis, OTR/L Acute Rehabilitation Services Office [phone] Secure Chat Communication Preferred   Donia Pounds 08/11/2023, 11:00 AM

## 2023-08-12 ENCOUNTER — Telehealth (HOSPITAL_COMMUNITY): Payer: Self-pay | Admitting: Pharmacy Technician

## 2023-08-12 ENCOUNTER — Inpatient Hospital Stay (HOSPITAL_COMMUNITY): Payer: Medicare Other

## 2023-08-12 ENCOUNTER — Other Ambulatory Visit (HOSPITAL_COMMUNITY): Payer: Self-pay

## 2023-08-12 DIAGNOSIS — S22080A Wedge compression fracture of T11-T12 vertebra, initial encounter for closed fracture: Secondary | ICD-10-CM | POA: Diagnosis not present

## 2023-08-12 LAB — GLUCOSE, CAPILLARY
Glucose-Capillary: 105 mg/dL — ABNORMAL HIGH (ref 70–99)
Glucose-Capillary: 86 mg/dL (ref 70–99)

## 2023-08-12 LAB — CBC WITH DIFFERENTIAL/PLATELET
Abs Immature Granulocytes: 0.27 10*3/uL — ABNORMAL HIGH (ref 0.00–0.07)
Basophils Absolute: 0.1 10*3/uL (ref 0.0–0.1)
Basophils Relative: 1 %
Eosinophils Absolute: 0.3 10*3/uL (ref 0.0–0.5)
Eosinophils Relative: 2 %
HCT: 33.8 % — ABNORMAL LOW (ref 36.0–46.0)
Hemoglobin: 11.1 g/dL — ABNORMAL LOW (ref 12.0–15.0)
Immature Granulocytes: 2 %
Lymphocytes Relative: 31 %
Lymphs Abs: 3.7 10*3/uL (ref 0.7–4.0)
MCH: 29.8 pg (ref 26.0–34.0)
MCHC: 32.8 g/dL (ref 30.0–36.0)
MCV: 90.6 fL (ref 80.0–100.0)
Monocytes Absolute: 1 10*3/uL (ref 0.1–1.0)
Monocytes Relative: 9 %
Neutro Abs: 6.5 10*3/uL (ref 1.7–7.7)
Neutrophils Relative %: 55 %
Platelets: 319 10*3/uL (ref 150–400)
RBC: 3.73 MIL/uL — ABNORMAL LOW (ref 3.87–5.11)
RDW: 14.6 % (ref 11.5–15.5)
WBC: 11.9 10*3/uL — ABNORMAL HIGH (ref 4.0–10.5)
nRBC: 0 % (ref 0.0–0.2)

## 2023-08-12 LAB — COMPREHENSIVE METABOLIC PANEL
ALT: 12 U/L (ref 0–44)
AST: 21 U/L (ref 15–41)
Albumin: 3.2 g/dL — ABNORMAL LOW (ref 3.5–5.0)
Alkaline Phosphatase: 67 U/L (ref 38–126)
Anion gap: 9 (ref 5–15)
BUN: 34 mg/dL — ABNORMAL HIGH (ref 8–23)
CO2: 23 mmol/L (ref 22–32)
Calcium: 9 mg/dL (ref 8.9–10.3)
Chloride: 105 mmol/L (ref 98–111)
Creatinine, Ser: 0.96 mg/dL (ref 0.44–1.00)
GFR, Estimated: 56 mL/min — ABNORMAL LOW (ref >60)
Glucose, Bld: 105 mg/dL — ABNORMAL HIGH (ref 70–99)
Potassium: 4.4 mmol/L (ref 3.5–5.1)
Sodium: 137 mmol/L (ref 135–145)
Total Bilirubin: 0.6 mg/dL (ref 0.0–1.2)
Total Protein: 6.9 g/dL (ref 6.5–8.1)

## 2023-08-12 LAB — C-REACTIVE PROTEIN: CRP: 4.5 mg/dL — ABNORMAL HIGH (ref <1.0)

## 2023-08-12 LAB — SEDIMENTATION RATE: Sed Rate: 65 mm/h — ABNORMAL HIGH (ref 0–22)

## 2023-08-12 LAB — PROCALCITONIN: Procalcitonin: 0.11 ng/mL

## 2023-08-12 LAB — LACTIC ACID, PLASMA: Lactic Acid, Venous: 0.8 mmol/L (ref 0.5–1.9)

## 2023-08-12 LAB — MAGNESIUM: Magnesium: 2.1 mg/dL (ref 1.7–2.4)

## 2023-08-12 LAB — PHOSPHORUS: Phosphorus: 2.9 mg/dL (ref 2.5–4.6)

## 2023-08-12 MED ORDER — HYDROCODONE-ACETAMINOPHEN 5-325 MG PO TABS
1.0000 | ORAL_TABLET | ORAL | 0 refills | Status: DC | PRN
  Filled 2023-08-12: qty 30, 3d supply, fill #0

## 2023-08-12 MED ORDER — POLYETHYLENE GLYCOL 3350 17 GM/SCOOP PO POWD
17.0000 g | Freq: Every day | ORAL | 0 refills | Status: AC
  Filled 2023-08-12: qty 238, 14d supply, fill #0

## 2023-08-12 MED ORDER — ENSURE ENLIVE PO LIQD
237.0000 mL | Freq: Two times a day (BID) | ORAL | 12 refills | Status: DC
  Filled 2023-08-12: qty 237, 1d supply, fill #0

## 2023-08-12 MED ORDER — GABAPENTIN 100 MG PO CAPS
100.0000 mg | ORAL_CAPSULE | Freq: Three times a day (TID) | ORAL | 0 refills | Status: DC | PRN
  Filled 2023-08-12 (×2): qty 90, 30d supply, fill #0

## 2023-08-12 MED ORDER — ACETAMINOPHEN 325 MG PO TABS: 650.0000 mg | ORAL_TABLET | Freq: Four times a day (QID) | ORAL | Status: AC | PRN

## 2023-08-12 MED ORDER — ONDANSETRON HCL 4 MG PO TABS
4.0000 mg | ORAL_TABLET | Freq: Four times a day (QID) | ORAL | 0 refills | Status: DC | PRN
  Filled 2023-08-12: qty 20, 5d supply, fill #0

## 2023-08-12 MED ORDER — SENNOSIDES-DOCUSATE SODIUM 8.6-50 MG PO TABS
1.0000 | ORAL_TABLET | Freq: Every day | ORAL | 0 refills | Status: AC
  Filled 2023-08-12: qty 30, 30d supply, fill #0

## 2023-08-12 MED ORDER — METHOCARBAMOL 500 MG PO TABS
500.0000 mg | ORAL_TABLET | Freq: Three times a day (TID) | ORAL | 0 refills | Status: DC | PRN
  Filled 2023-08-12 (×2): qty 15, 5d supply, fill #0

## 2023-08-12 MED ORDER — LIDOCAINE 5 % EX PTCH
1.0000 | MEDICATED_PATCH | Freq: Every day | CUTANEOUS | 0 refills | Status: DC
  Filled 2023-08-12: qty 30, 30d supply, fill #0

## 2023-08-12 NOTE — Discharge Summary (Addendum)
 Physician Discharge Summary   Patient: Jessica [name] MRN: [id number] DOB: [birth year]  Admit date:     08/04/2023  Discharge date: 08/12/23  Discharge Physician: Jonah Blue   PCP: Georganna Skeans, MD   Recommendations at discharge:   Wear TLSO brace as needed Continue gabapentin (Neurontin) up to 3 times daily as needed, Norco up with every 4 hours as needed for severe pain, methocarbamol (Robaxin) up to 3 times daily as needed for muscle spasms, and lidoderm patches as needed; do not drive or make important decisions while taking these medications Home health physical and occupational therapy have been ordered Continue aggressive bowel regimen as prescribed Follow up with orthopedic surgery (Dr. Eulah Pont) in 1-2 weeks Follow up with Dr. Andrey Campanile in 1-2 weeks  Discharge Diagnoses: Principal Problem:   T12 compression fracture (HCC) Active Problems:   Hyperlipidemia   HTN (hypertension)   Osteoarthritis   Constipation   Back pain   History of iron deficiency anemia   Chronic kidney disease, stage 3a (HCC)   Type 2 diabetes mellitus without complication, without long-term current use of insulin [facility])    Hospital Course: 89yo with h/o HTN, stage 3a CKD, and DM who presented on 2/4 with severe back pain after a fall a week prior.  She was found to have a T12 compression fracture with 35% height loss.  She had minimally improvement with pain control and a TLSO brace.  IR was consulted and she underwent kyphoplasty on 08/09/22.  Hospitalization has been complicated by constipation and persistent/worsening leukocytosis.   Assessment and Plan:  Back Pain with radiculopathy due to T10 and T12 compression fractures Failed conservative management with pain medications and TLSO brace Underwent kyphoplasty Currently able to get up to the bathroom without significant pain, feels like she will be successful at home at this time Continue TLSO as needed Home health PT/OT ordered Continue  gabapentin, prn Norco, and lidoderm patch for now and wean off as no longer needed  Osteoporosis Pathology report with disrupted bone trabeculae with marked reactive changes consistent with fracture  Recommend outpatient discussion of osteoporosis treatment    Cystocele Bowel regimen initiated     HTN  Continue amlodipine and Toprol XL Resume hydrochlorothiazide 12.5 mg daily   Leukocytosis Likely reactive in the setting of pain and acute stress Improving No current concern for infection Blood cultures negative at 24 hours   Hyperlipidemia Continue pravastatin    Type 2 Diabetes Mellitus without complication, without long-term current use of insulin  A1c is 6.0 Diet controlled There is no indication for ongoing monitoring of this issue  Constipation Bowel regimen was initiated with standing Miralax BID and Senna-S BID; Bisacodyl PRN moderate constipation  She was also given a smog enema and additional suppository which caused her to have a large bowel movement and she is now feeling better   CKD Stage 3a, improved Stable   Normocytic Anemia Mild, likely anemia of chronic disease Stable   Right Knee Pain Right knee DG done and showed " Severe patellofemoral osteoarthritis. Tiny joint effusion. No definitive acute fracture line is seen." Discussed with orthopedic surgery PA Earney Hamburg and he injected her knee with 40 mg of Solu-Medrol and 5 mL of 0.5% [facility] recommending outpatient follow-up with Dr. Eulah Pont in 1 to 2 weeks        Consultants: IR PT OT TOC team   Procedures: T10 and T12 balloon kyphoplasty   Antibiotics: Cefazolin x 1 dose    Pain control -  [place] Controlled Substance Reporting System database was reviewed. and patient was instructed, not to drive, operate heavy machinery, perform activities at heights, swimming or participation in water activities or provide baby-sitting services while on Pain, Sleep and  Anxiety Medications; until their outpatient Physician has advised to do so again. Also recommended to not to take more than prescribed Pain, Sleep and Anxiety Medications.   Disposition: Home Diet recommendation:  Discharge Diet Orders (From admission, onward)     Start     Ordered   08/11/23 0000  Diet - low sodium heart healthy        08/11/23 1721   08/11/23 0000  Diet Carb Modified        08/11/23 1721           Regular diet DISCHARGE MEDICATION: Allergies as of 08/12/2023   No Known Allergies      Medication List     STOP taking these medications    hydrochlorothiazide 12.5 MG tablet Commonly known as: HYDRODIURIL   HYDROcodone-acetaminophen 7.5-325 MG tablet Commonly known as: Norco Replaced by: HYDROcodone-acetaminophen 5-325 MG tablet       TAKE these medications    acetaminophen 325 MG tablet Commonly known as: TYLENOL Take 2 tablets (650 mg total) by mouth every 6 (six) hours as needed for mild pain (pain score 1-3) (or Fever >/= 101).   amLODipine 10 MG tablet Commonly known as: NORVASC TAKE 1 TABLET (10 MG TOTAL) BY MOUTH DAILY. DX: I10   esomeprazole 20 MG capsule Commonly known as: NEXIUM Take 1 capsule (20 mg total) by mouth daily at 12 noon. What changed: when to take this   feeding supplement Liqd Take 237 mLs by mouth 2 (two) times daily between meals.   gabapentin 100 MG capsule Commonly known as: NEURONTIN Take 1 capsule (100 mg total) by mouth 3 (three) times daily as needed (nerve pain).   GLUCOSAMINE PO Take 1 tablet by mouth every evening.   HYDROcodone-acetaminophen 5-325 MG tablet Commonly known as: NORCO/VICODIN Take 1-2 tablets by mouth every 4 (four) hours as needed for moderate pain (pain score 4-6). Replaces: HYDROcodone-acetaminophen 7.5-325 MG tablet   lidocaine 5 % Commonly known as: LIDODERM Place 1 patch onto the skin daily. Remove & Discard patch within 12 hours or as directed by MD   methocarbamol 500 MG  tablet Commonly known as: ROBAXIN Take 1 tablet (500 mg total) by mouth every 8 (eight) hours as needed for muscle spasms.   metoprolol succinate 25 MG 24 hr tablet Commonly known as: TOPROL-XL TAKE 1 TABLET (25 MG TOTAL) BY MOUTH DAILY. DX: I10 What changed:  when to take this additional instructions   multivitamin with minerals Tabs tablet Take 1 tablet by mouth daily.   ondansetron 4 MG tablet Commonly known as: ZOFRAN Take 1 tablet (4 mg total) by mouth every 6 (six) hours as needed for nausea.   polyethylene glycol 17 g packet Commonly known as: MIRALAX / GLYCOLAX Take 17 g by mouth daily.   pravastatin 20 MG tablet Commonly known as: PRAVACHOL TAKE 1 TABLET BY MOUTH EVERY DAY IN THE EVENING   senna-docusate 8.6-50 MG tablet Commonly known as: Senokot-S Take 1 tablet by mouth at bedtime.        Follow-up Information     [facility] Follow up.   Specialty: Home Health Services Why: The home health agency will contact you for the first home visit. Contact information: [address] [place] [address]  [phone]                Discharge Exam:   Subjective: Feeling fine.  Has been up to the bathroom without difficulty.  Pain is much better controlled and she is excited to go home.  Discussed by telephone with her daughter, who is also pleased.   Objective: Vitals:   08/12/23 0750 08/12/23 1122  BP: (!) 137/54 (!) 148/61  Pulse: 67 60  Resp: 16 18  Temp: 98.6 F (37 C) 97.9 F (36.6 C)  SpO2: 96% 97%    Intake/Output Summary (Last 24 hours) at 08/12/2023 1155 Last data filed at 08/12/2023 0813 Gross per 24 hour  Intake 1049.64 ml  Output 1652 ml  Net -602.36 ml   Filed Weights   08/04/23 1009 08/04/23 2344  Weight: 75 kg 75 kg    Exam:  General:  Appears calm and comfortable and is in NAD Eyes:  EOMI, normal lids, iris ENT:  grossly normal hearing, lips & tongue, mmm Neck:  no LAD, masses or  thyromegaly Cardiovascular:  RRR, no m/r/g. No LE edema.  Respiratory:   CTA bilaterally with no wheezes/rales/rhonchi.  Normal respiratory effort. Abdomen:  soft, NT, ND Skin:  no rash or induration seen on limited exam Musculoskeletal:  grossly normal tone BUE/BLE, good ROM, no bony abnormality Psychiatric:  grossly normal mood and affect, speech fluent and appropriate, AOx3 Neurologic:  CN 2-12 grossly intact, moves all extremities in coordinated fashion  Data Reviewed: I have reviewed the patient's lab results since admission.  Pertinent labs for today include:   BUN 34/Creatinine 0.96/GFR 56 - stable Albumin 3.2 CRP 4.5 Lactate 0.8 WBC 11.9, improved from 14.1 Hgb 11.1 Blood cultures NTD x 1 day    Condition at discharge: stable  The results of significant diagnostics from this hospitalization (including imaging, microbiology, ancillary and laboratory) are listed below for reference.   Imaging Studies: DG CHEST PORT 1 VIEW Result Date: 08/12/2023 CLINICAL DATA:  Leukocytosis EXAM: PORTABLE CHEST 1 VIEW COMPARISON:  09/18/2011 FINDINGS: Cardiac shadow is mildly prominent but accentuated by the portable technique. Aortic calcifications are noted. Changes of recent vertebral augmentation are seen. Lungs are well aerated bilaterally. Minimal right basilar atelectasis is seen. Patient is rotated to the right accentuating the mediastinal markings. IMPRESSION: Mild right basilar atelectasis.  No new focal abnormality is seen. Electronically Signed   By: Alcide Clever M.D.   On: 08/12/2023 09:26   IR KYPHO THORACIC WITH BONE BIOPSY Result Date: 08/10/2023 INDICATION: 89 year old female with T10 and T12 presumed osteoporotic compression fractures and intractable back pain unresponsive to medical management. EXAM: FLUOROSCOPY GUIDED T10 AND T12 CORE BONE BIOPSY AND BALLOON KYPHOPLASTY COMPARISON:  MRI of the thoracic spine August 05, 2023. MEDICATIONS: As antibiotic prophylaxis, Ancef 2 g  IV was ordered pre-procedure and administered intravenously within 1 hour of incision. All current medications are in the EMR and have been reviewed as part of this encounter. ANESTHESIA/SEDATION: Moderate (conscious) sedation was employed during this procedure. A total of Versed 2.5 mg, fentanyl 150 mcg and Toradol 15 mg were administered intravenously by the radiology nurse. Total intra-service moderate Sedation Time: 68 minutes. The patient's level of consciousness and vital signs were monitored continuously by radiology nursing throughout the procedure under my direct supervision. FLUOROSCOPY: Radiation Exposure Index (as provided by the fluoroscopic device): 2,768 mGy Kerma COMPLICATIONS: None immediate. PROCEDURE: Following a full explanation of the procedure along with the potential associated complications, an informed witnessed consent was obtained. The  patient was placed in prone position on the angiography table. The thoracic spine region was prepped and draped in a sterile fashion. Under fluoroscopy, the T10 vertebral body was delineated and the skin area was marked. The skin was infiltrated with a 1% Lidocaine approximately 3 cm lateral to the spinous process projection on the right. Using a 22-gauge spinal needle, the soft issue and the peripedicular space and periosteum were infiltrated with Bupivacaine 0.25%. A skin incision was made at the access site. Subsequently, an 11-gauge Kyphon trocar was inserted under fluoroscopic guidance until contact with the pedicle was obtained. The trocar was inserted under light hammer tapping into the pedicle until the posterior boundaries of the vertebral body was reached. The diamond mandrill was removed and one core biopsy wasobtained. The skin was infiltrated with a 1% Lidocaine approximately 3 cm lateral to the spinous process projection on the left. Using a 22-gauge spinal needle, the soft issue and the peripedicular space and periosteum were infiltrated with  Bupivacaine 0.25%. A skin incision was made at the access site. Subsequently, an 11-gauge Kyphon trocar was inserted under fluoroscopic guidance until contact with the pedicle was obtained. The trocar was inserted under light hammer tapping into the pedicle until the posterior boundaries of the vertebral body was reached. The diamond mandrill was removed. A bone drill was coaxially advanced within the anterior third of the vertebral body and then exchanged for inflatable Kyphon balloons. These were centered within the mid-aspect of the vertebral body. The balloons were inflated to create a void to serve as a repository for the bone cement. Both balloons were deflated and through both cannulas, under continuous fluoroscopy guidance in the AP and lateral views, the vertebral body was filled with previously mixed polymethyl-methacrylate (PMMA) added to barium for opacification. Both cannulas were later removed. Under fluoroscopy, the T12 vertebral body was delineated and the skin area was marked. The skin was infiltrated with a 1% Lidocaine approximately 3.2 cm lateral to the spinous process projection on the right. Using a 22-gauge spinal needle, the soft issue and the peripedicular space and periosteum were infiltrated with Bupivacaine 0.25%. A skin incision was made at the access site. Subsequently, an 11-gauge Kyphon trocar was inserted under fluoroscopic guidance until contact with the pedicle was obtained. The trocar was inserted under light hammer tapping into the pedicle until the posterior boundaries of the vertebral body was reached. The diamond mandrill was removed and one core biopsy wasobtained. The skin was infiltrated with a 1% Lidocaine approximately 3.2 cm lateral to the spinous process projection on the left. Using a 22-gauge spinal needle, the soft issue and the peripedicular space and periosteum were infiltrated with Bupivacaine 0.25%. A skin incision was made at the access site. Subsequently, an  11-gauge Kyphon trocar was inserted under fluoroscopic guidance until contact with the pedicle was obtained. The trocar was inserted under light hammer tapping into the pedicle until the posterior boundaries of the vertebral body was reached. The diamond mandrill was removed. A bone drill was coaxially advanced within the anterior third of the vertebral body and then exchanged for inflatable Kyphon balloons. These were centered within the mid-aspect of the vertebral body. The balloons were inflated to create a void to serve as a repository for the bone cement. Both balloons were deflated and through both cannulas, under continuous fluoroscopy guidance in the AP and lateral views, the vertebral body was filled with previously mixed polymethyl-methacrylate (PMMA) added to barium for opacification. Both cannulas were later removed. The access  sites were cleaned and covered with a sterile bandage. IMPRESSION: 1. Successful fluoroscopy-guided bilateral transpedicular approach for T10 and T12 vertebral body core bone biopsy and kyphoplasty for treatment of osteoporotic fragility fracture. Bone samples obtained were sent for pathology analysis. 2. If the patient has known osteoporosis, recommend treatment as clinically indicated. If the patient's bone density status is unknown, DEXA scan is recommended. Electronically Signed   By: Baldemar Lenis M.D.   On: 08/10/2023 15:32   IR KYPHO EA ADDL LEVEL THORACIC OR LUMBAR Result Date: 08/10/2023 INDICATION: 89 year old female with T10 and T12 presumed osteoporotic compression fractures and intractable back pain unresponsive to medical management. EXAM: FLUOROSCOPY GUIDED T10 AND T12 CORE BONE BIOPSY AND BALLOON KYPHOPLASTY COMPARISON:  MRI of the thoracic spine August 05, 2023. MEDICATIONS: As antibiotic prophylaxis, Ancef 2 g IV was ordered pre-procedure and administered intravenously within 1 hour of incision. All current medications are in the EMR and have  been reviewed as part of this encounter. ANESTHESIA/SEDATION: Moderate (conscious) sedation was employed during this procedure. A total of Versed 2.5 mg, fentanyl 150 mcg and Toradol 15 mg were administered intravenously by the radiology nurse. Total intra-service moderate Sedation Time: 68 minutes. The patient's level of consciousness and vital signs were monitored continuously by radiology nursing throughout the procedure under my direct supervision. FLUOROSCOPY: Radiation Exposure Index (as provided by the fluoroscopic device): 2,768 mGy Kerma COMPLICATIONS: None immediate. PROCEDURE: Following a full explanation of the procedure along with the potential associated complications, an informed witnessed consent was obtained. The patient was placed in prone position on the angiography table. The thoracic spine region was prepped and draped in a sterile fashion. Under fluoroscopy, the T10 vertebral body was delineated and the skin area was marked. The skin was infiltrated with a 1% Lidocaine approximately 3 cm lateral to the spinous process projection on the right. Using a 22-gauge spinal needle, the soft issue and the peripedicular space and periosteum were infiltrated with Bupivacaine 0.25%. A skin incision was made at the access site. Subsequently, an 11-gauge Kyphon trocar was inserted under fluoroscopic guidance until contact with the pedicle was obtained. The trocar was inserted under light hammer tapping into the pedicle until the posterior boundaries of the vertebral body was reached. The diamond mandrill was removed and one core biopsy wasobtained. The skin was infiltrated with a 1% Lidocaine approximately 3 cm lateral to the spinous process projection on the left. Using a 22-gauge spinal needle, the soft issue and the peripedicular space and periosteum were infiltrated with Bupivacaine 0.25%. A skin incision was made at the access site. Subsequently, an 11-gauge Kyphon trocar was inserted under fluoroscopic  guidance until contact with the pedicle was obtained. The trocar was inserted under light hammer tapping into the pedicle until the posterior boundaries of the vertebral body was reached. The diamond mandrill was removed. A bone drill was coaxially advanced within the anterior third of the vertebral body and then exchanged for inflatable Kyphon balloons. These were centered within the mid-aspect of the vertebral body. The balloons were inflated to create a void to serve as a repository for the bone cement. Both balloons were deflated and through both cannulas, under continuous fluoroscopy guidance in the AP and lateral views, the vertebral body was filled with previously mixed polymethyl-methacrylate (PMMA) added to barium for opacification. Both cannulas were later removed. Under fluoroscopy, the T12 vertebral body was delineated and the skin area was marked. The skin was infiltrated with a 1% Lidocaine approximately 3.2 cm  lateral to the spinous process projection on the right. Using a 22-gauge spinal needle, the soft issue and the peripedicular space and periosteum were infiltrated with Bupivacaine 0.25%. A skin incision was made at the access site. Subsequently, an 11-gauge Kyphon trocar was inserted under fluoroscopic guidance until contact with the pedicle was obtained. The trocar was inserted under light hammer tapping into the pedicle until the posterior boundaries of the vertebral body was reached. The diamond mandrill was removed and one core biopsy wasobtained. The skin was infiltrated with a 1% Lidocaine approximately 3.2 cm lateral to the spinous process projection on the left. Using a 22-gauge spinal needle, the soft issue and the peripedicular space and periosteum were infiltrated with Bupivacaine 0.25%. A skin incision was made at the access site. Subsequently, an 11-gauge Kyphon trocar was inserted under fluoroscopic guidance until contact with the pedicle was obtained. The trocar was inserted under  light hammer tapping into the pedicle until the posterior boundaries of the vertebral body was reached. The diamond mandrill was removed. A bone drill was coaxially advanced within the anterior third of the vertebral body and then exchanged for inflatable Kyphon balloons. These were centered within the mid-aspect of the vertebral body. The balloons were inflated to create a void to serve as a repository for the bone cement. Both balloons were deflated and through both cannulas, under continuous fluoroscopy guidance in the AP and lateral views, the vertebral body was filled with previously mixed polymethyl-methacrylate (PMMA) added to barium for opacification. Both cannulas were later removed. The access sites were cleaned and covered with a sterile bandage. IMPRESSION: 1. Successful fluoroscopy-guided bilateral transpedicular approach for T10 and T12 vertebral body core bone biopsy and kyphoplasty for treatment of osteoporotic fragility fracture. Bone samples obtained were sent for pathology analysis. 2. If the patient has known osteoporosis, recommend treatment as clinically indicated. If the patient's bone density status is unknown, DEXA scan is recommended. Electronically Signed   By: Baldemar Lenis M.D.   On: 08/10/2023 15:32   DG Abd 1 View Result Date: 08/07/2023 CLINICAL DATA:  Abdominal distention. EXAM: ABDOMEN - 1 VIEW COMPARISON:  08/04/2023. FINDINGS: The bowel gas pattern is normal. No radio-opaque calculi or other acute radiographic abnormality are seen. Total hip arthroplasty changes are present on the right. There are degenerative changes in the thoracolumbar spine. IMPRESSION: Nonobstructive bowel-gas pattern. Electronically Signed   By: Thornell Sartorius M.D.   On: 08/07/2023 22:23   MR THORACIC SPINE WO CONTRAST Result Date: 08/05/2023 CLINICAL DATA:  Compression fracture T12. EXAM: MRI THORACIC SPINE WITHOUT CONTRAST TECHNIQUE: Multiplanar, multisequence MR imaging of the thoracic  spine was performed. No intravenous contrast was administered. COMPARISON:  CT of the lumbar spine 08/04/2023. FINDINGS: Alignment: Slight degenerative anterolisthesis is present at T1-2. No other significant listhesis is present. Upper thoracic kyphosis is slightly exaggerated. Vertebrae: The T12 compression fracture is present. 30% loss of height is present relative to the adjacent level. Minimal retropulsed bone is present along the superior endplate. Edema is present along the superior endplate of T10 with the compression fracture revealing 25% loss of height in the midbody of T10. Mild edematous endplate changes are present anteriorly at T6-7. No other acute fractures are present. Cord:  Normal signal and morphology. Paraspinal and other soft tissues: The visualized lung fields are clear. A large hiatal hernia is present. Mild dependent atelectasis present bilaterally. The upper abdomen is unremarkable. Disc levels: Significant disc disease is present in the upper thoracic spine. Bilateral facet  hypertrophy is noted at C7-T1 without focal stenosis. A broad-based disc protrusion is present at T11-12. No focal stenosis is present. T12-L1: Broad-based disc protrusion is asymmetric to the left. Mild facet hypertrophy is noted bilaterally. No focal stenosis is present. Mild disc bulging facet hypertrophy is present at L1-2 without focal stenosis. IMPRESSION: 1. Acute compression fracture of T12 with 30% loss of height relative to the adjacent level. 2. Acute compression fracture of T10 with 25% loss of height in the midbody of T10. 3. Mild edematous endplate changes anteriorly at T6-7. 4. Multilevel spondylosis of the thoracic spine as described. 5. Large hiatal hernia. Electronically Signed   By: Marin Roberts M.D.   On: 08/05/2023 21:34   CT L-SPINE NO CHARGE Result Date: 08/04/2023 CLINICAL DATA:  Back pain since a fall last week. EXAM: CT LUMBAR SPINE WITH CONTRAST TECHNIQUE: Technique: Multiplanar CT  images of the lumbar spine were reconstructed from contemporary CT of the Abdomen and Pelvis. RADIATION DOSE REDUCTION: This exam was performed according to the departmental dose-optimization program which includes automated exposure control, adjustment of the mA and/or kV according to patient size and/or use of iterative reconstruction technique. CONTRAST:  No additional. COMPARISON:  Lumbar spine MRI 02/02/2006 FINDINGS: Segmentation: 5 lumbar type vertebrae. Alignment: Mild lumbar levoscoliosis. Trace retrolisthesis of L3 on L4. Vertebrae: T12 compression fracture with 35% vertebral body height loss centrally and recent appearance with visible fracture line. No significant retropulsion or posterior element fracture. Diffuse osteopenia. No destructive bone lesion. Paraspinal and other soft tissues: Paravertebral soft tissue edema at T12. Remainder of the abdomen and pelvis reported separately. Disc levels: Multilevel disc degeneration, most advanced on the right at L3-4 and L4-5. Mild right greater than left neural foraminal stenosis at L3-4 and mild-to-moderate right neural foraminal stenosis at L4-5. No evidence of high-grade spinal canal stenosis. IMPRESSION: Recent T12 compression fracture with 35% height loss. Electronically Signed   By: Sebastian Ache M.D.   On: 08/04/2023 15:29   CT Head Wo Contrast Result Date: 08/04/2023 CLINICAL DATA:  Head trauma, minor (Age >= 65y).  Recent fall. EXAM: CT HEAD WITHOUT CONTRAST TECHNIQUE: Contiguous axial images were obtained from the base of the skull through the vertex without intravenous contrast. RADIATION DOSE REDUCTION: This exam was performed according to the departmental dose-optimization program which includes automated exposure control, adjustment of the mA and/or kV according to patient size and/or use of iterative reconstruction technique. COMPARISON:  None Available. FINDINGS: Brain: There is no evidence of an acute infarct, intracranial hemorrhage, mass,  or midline shift. Cerebral white matter hypodensities are nonspecific but compatible with mild chronic small vessel ischemic disease. There is mild cerebral atrophy. Slightly asymmetrically prominent extra-axial CSF over the left frontal convexity is without significant mass effect and is favored to be secondary to atrophy, although a very small subdural hygroma is not excluded. Vascular: Calcified atherosclerosis at the skull base. No hyperdense vessel. Skull: No acute fracture or suspicious osseous lesion. Sinuses/Orbits: Right sphenoid sinus mucous retention cyst. Clear mastoid air cells. Bilateral cataract extraction. Other: None. IMPRESSION: 1. No evidence of acute intracranial abnormality. 2. Mild chronic small vessel ischemic disease and cerebral atrophy. Electronically Signed   By: Sebastian Ache M.D.   On: 08/04/2023 15:22   CT ABDOMEN PELVIS W CONTRAST Result Date: 08/04/2023 CLINICAL DATA:  Bowel obstruction suspected Abdominal pain, acute, nonlocalized EXAM: CT ABDOMEN AND PELVIS WITH CONTRAST TECHNIQUE: Multidetector CT imaging of the abdomen and pelvis was performed using the standard protocol following bolus administration  of intravenous contrast. RADIATION DOSE REDUCTION: This exam was performed according to the departmental dose-optimization program which includes automated exposure control, adjustment of the mA and/or kV according to patient size and/or use of iterative reconstruction technique. CONTRAST:  75mL OMNIPAQUE IOHEXOL 350 MG/ML SOLN COMPARISON:  None Available. FINDINGS: Lower chest: Moderate hiatal hernia. No pleural or pericardial effusion. Mild dependent atelectasis in the lung bases. Hepatobiliary: Gallbladder physiologically distended without calcified stones. Ectatic CBD 1.2 cm diameter. Minimal central intrahepatic biliary ductal ectasia. No focal liver lesion. Pancreas: Unremarkable. No pancreatic ductal dilatation or surrounding inflammatory changes. Spleen: Normal in size.  Coarse subcapsular calcifications. No acute lesion. Adrenals/Urinary Tract: No adrenal mass. No urolithiasis. No hydronephrosis. 3 cm 9 HU probable cyst, left lower pole; no follow-up warranted. Urinary bladder is distended. Stomach/Bowel: Moderate hiatal hernia. Stomach is nondilated. Small bowel decompressed. Appendix not identified. The colon is partially distended, with scattered sigmoid diverticula; no adjacent inflammatory change. Vascular/Lymphatic: Moderate calcified aortoiliac plaque without aneurysm. Portal vein patent. No abdominal or pelvic adenopathy. Reproductive: Status post hysterectomy. No adnexal masses. Other: No ascites.  No free air. Musculoskeletal: Mild T12 vertebral compression deformity without significant retropulsion. Multilevel spondylitic change in the lumbar spine. IMPRESSION: 1. No acute findings. 2. Moderate hiatal hernia. 3. Sigmoid diverticulosis. Electronically Signed   By: Corlis Leak M.D.   On: 08/04/2023 15:16   DG Knee Complete 4 Views Right Result Date: 08/04/2023 CLINICAL DATA:  Fall.  Right knee pain. EXAM: RIGHT KNEE - COMPLETE 4+ VIEW COMPARISON:  None Available. FINDINGS: There is diffuse decreased bone mineralization. Severe patellofemoral joint space narrowing with diffuse bone-on-bone contact, subchondral sclerosis, and moderate peripheral osteophytosis. Tiny joint effusion. Mild medial and lateral compartment chondrocalcinosis without significant joint space narrowing. No definitive acute fracture line is seen. Mild atherosclerotic calcifications. IMPRESSION: 1. Severe patellofemoral osteoarthritis. 2. Tiny joint effusion. 3. No definitive acute fracture line is seen. Electronically Signed   By: Neita Garnet M.D.   On: 08/04/2023 12:18    Microbiology: Results for orders placed or performed during the hospital encounter of 08/04/23  Culture, blood (Routine X 2) w Reflex to ID Panel     Status: None (Preliminary result)   Collection Time: 08/11/23  6:09 PM    Specimen: BLOOD RIGHT HAND  Result Value Ref Range Status   Specimen Description BLOOD RIGHT HAND  Final   Special Requests   Final    BOTTLES DRAWN AEROBIC ONLY Blood Culture results may not be optimal due to an inadequate volume of blood received in culture bottles   Culture   Final    NO GROWTH < 24 HOURS Performed at [facility], [address] [place], [place] [address]    Report Status PENDING  Incomplete  Culture, blood (Routine X 2) w Reflex to ID Panel     Status: None (Preliminary result)   Collection Time: 08/11/23  6:10 PM   Specimen: BLOOD LEFT HAND  Result Value Ref Range Status   Specimen Description BLOOD LEFT HAND  Final   Special Requests   Final    BOTTLES DRAWN AEROBIC ONLY Blood Culture results may not be optimal due to an inadequate volume of blood received in culture bottles   Culture   Final    NO GROWTH < 24 HOURS Performed at [facility], [address], [place], [place] [address]    Report Status PENDING  Incomplete    Labs: CBC: Recent Labs  Lab 08/09/23 [phone] 08/10/23 0540 08/11/23  9528 08/11/23 1548 08/12/23 0543  WBC 11.3* 11.3* 11.6* 14.1* 11.9*  NEUTROABS 6.7 6.0 7.1 8.9* 6.5  HGB 12.0 11.2* 11.4* 11.5* 11.1*  HCT 35.8* 34.0* 34.5* 34.9* 33.8*  MCV 88.8 90.2 91.3 90.4 90.6  PLT 324 327 351 354 319   Basic Metabolic Panel: Recent Labs  Lab 08/08/23 0539 08/09/23 0653 08/10/23 0540 08/11/23 0526 08/12/23 0543  NA 134* 133* 136 136 137  K 4.5 4.2 4.4 4.6 4.4  CL 99 100 101 100 105  CO2 22 22 22  21* 23  GLUCOSE 112* 101* 98 98 105*  BUN 18 21 36* 35* 34*  CREATININE 0.88 0.87 1.07* 1.06* 0.96  CALCIUM 9.3 9.4 9.0 9.3 9.0  MG 1.9 2.0 2.2 2.1 2.1  PHOS 3.8 3.6 4.4 3.7 2.9   Liver Function Tests: Recent Labs  Lab 08/08/23 0539 08/09/23 0653 08/10/23 0540 08/11/23 0526 08/12/23 0543  AST 19 20 16 23 21   ALT 15 15 15 14 12   ALKPHOS 65 65 61 77 67  BILITOT 0.5 0.7 0.5 0.4 0.6  PROT 7.4 7.4 6.8 6.9 6.9   ALBUMIN 3.3* 3.4* 3.2* 3.3* 3.2*   CBG: Recent Labs  Lab 08/11/23 1215 08/11/23 1625 08/11/23 2106 08/12/23 0602 08/12/23 1124  GLUCAP 143* 142* 117* 105* 86    Discharge time spent: greater than 30 minutes.  Signed: Jonah Blue, MD [facility] 08/12/2023

## 2023-08-12 NOTE — Telephone Encounter (Signed)
Pharmacy Patient Advocate Encounter  Received notification from [facility] that Prior Authorization for  Lidocaine 5% patches  has been APPROVED from 08/12/2023 to 06/29/2024   PA #/Case ID/Reference #: [id number]

## 2023-08-12 NOTE — TOC Transition Note (Signed)
Transition of Care [facility]) - Discharge Note   Patient Details  Name: Jessica [name] MRN: [id number] Date of Birth: 05/07/1930  [facility] CM/SW Contact:  Kermit Balo, RN Phone Number: 08/12/2023, 12:10 PM   Clinical Narrative:     Pt stayed overnight due to elevated WBC. Today she can d/c home. Her daughter in law is at the bedside and will provide transportation home. Son has caregivers arranged for her at home.  [facility] will contact her for the first home visit.  Final next level of care: Home w Home Health Services Barriers to Discharge: No Barriers Identified   Patient Goals and CMS Choice   CMS Medicare.gov Compare Post Acute Care list provided to:: Patient Choice offered to / list presented to : Patient, Adult Children      Discharge Placement                       Discharge Plan and Services Additional resources added to the After Visit Summary for     Discharge Planning Services: CM Consult Post Acute Care Choice: Home Health                    HH Arranged: PT, OT [facility] Agency: [facility] Care Date [facility] Agency Contacted: 08/05/23   Representative spoke with at [facility] Agency: Kandee Keen  Social Drivers of Health (SDOH) Interventions SDOH Screenings   Food Insecurity: No Food Insecurity (08/04/2023)  Housing: Patient Declined (08/04/2023)  Transportation Needs: No Transportation Needs (08/04/2023)  Utilities: Patient Declined (08/04/2023)  Depression (PHQ2-9): Low Risk  (03/03/2023)  Financial Resource Strain: Low Risk  (10/25/2022)  Physical Activity: Unknown (10/25/2022)  Social Connections: Socially Isolated (08/05/2023)  Stress: No Stress Concern Present (10/25/2022)  Tobacco Use: Medium Risk (08/04/2023)     Readmission Risk Interventions     No data to display

## 2023-08-12 NOTE — Telephone Encounter (Signed)
Pharmacy Patient Advocate Encounter   Received notification from Fax that prior authorization for Lidocaine 5% patches is required/requested.   Insurance verification completed.   The patient is insured through [place] .   Per test claim: PA required; PA submitted to above mentioned insurance via CoverMyMeds Key/confirmation #/EOC [id number] Status is pending

## 2023-08-12 NOTE — Progress Notes (Signed)
Physical Therapy Treatment Patient Details Name: Jessica [name] MRN: [id number] DOB: Nov 05, 1929 Today's Date: 08/12/2023   History of Present Illness 89 y.o. female presents to [facility] hospital on 08/04/2023 with severe back pain after recent fall. Pt found to haveT10 and T12 compression fx. 2/10 balloon kyphoplasty.  PMH includes HTN, OA, hiatal hernia, CKD, DMII.    PT Comments  Pt resting in bed on arrival, daughter at bedside and supportive. Session focused on education re; safe car entry/exit, importance of continued brace wear, appropriate activity progression, continued RW use and all precautions. Handout provided and reviewed with pt as pt expressing concerns with what she can and cannot do, educated pt on modifications with tasks that pt expressed interest in (cooking etc) with placing objects at counter height to avoid bending and reaching and having family/aides assist as needed. Pt without questions or concerns at end of session. Anticipate safe discharge, with assist level outlined below, once medically cleared, will continue to follow acutely.     If plan is discharge home, recommend the following: A little help with walking and/or transfers;A lot of help with bathing/dressing/bathroom;Assistance with cooking/housework;Assist for transportation;Help with stairs or ramp for entrance   Can travel by private vehicle        Equipment Recommendations  None recommended by PT    Recommendations for Other Services       Precautions / Restrictions Precautions Precautions: Fall;Back Precaution Booklet Issued: Yes (comment) Recall of Precautions/Restrictions: Intact Precaution/Restrictions Comments: handout provided and reviewed, Required Braces or Orthoses: Spinal Brace Spinal Brace:  (reviwed importnce of wear) Restrictions Weight Bearing Restrictions Per Provider Order: No     Mobility  Bed Mobility Overal bed mobility: Needs Assistance             General bed mobility  comments: session focused on education per pt request, pt reporting she had no difficulty with bed mobility and ambualting to<>from bathrrom earlier in day    Transfers                        Ambulation/Gait                   Stairs             Wheelchair Mobility     Tilt Bed    Modified Rankin (Stroke Patients Only)       Balance Overall balance assessment: Needs assistance                                          Communication Communication Communication: Impaired Factors Affecting Communication: Hearing impaired  Cognition Arousal: Alert Behavior During Therapy: WFL for tasks assessed/performed   PT - Cognitive impairments: No apparent impairments                       PT - Cognition Comments: some repitition needed Following commands: Intact      Cueing Cueing Techniques: Verbal cues  Exercises      General Comments General comments (skin integrity, edema, etc.): VSS, daughter present and supportive, precatuion handout issued and reviewed and all pt questions and concerns addressed      Pertinent Vitals/Pain Pain Assessment Pain Assessment: Faces Faces Pain Scale: Hurts little more Pain Location: back Pain Descriptors / Indicators: Aching Pain Intervention(s): Monitored during session    Home Living  Prior Function            PT Goals (current goals can now be found in the care plan section) Acute Rehab PT Goals Patient Stated Goal: to reduce pain, improve ambulation PT Goal Formulation: With patient/family Time For Goal Achievement: 08/19/23 Progress towards PT goals: Progressing toward goals    Frequency    Min 1X/week      PT Plan      Co-evaluation              AM-PAC PT "6 Clicks" Mobility   Outcome Measure  Help needed turning from your back to your side while in a flat bed without using bedrails?: A Little Help needed moving from  lying on your back to sitting on the side of a flat bed without using bedrails?: A Little Help needed moving to and from a bed to a chair (including a wheelchair)?: A Little Help needed standing up from a chair using your arms (e.g., wheelchair or bedside chair)?: A Little Help needed to walk in hospital room?: A Little Help needed climbing 3-5 steps with a railing? : A Lot 6 Click Score: 17    End of Session   Activity Tolerance: No increased pain;Patient tolerated treatment well Patient left: with call bell/phone within reach;in bed;with family/visitor present;with nursing/sitter in room (due to 10/10 pain wanted to return to bed/sidelying) Nurse Communication: Mobility status PT Visit Diagnosis: Other abnormalities of gait and mobility (R26.89);Muscle weakness (generalized) (M62.81);Pain Pain - part of body:  (back)     Time: 1211-1224 PT Time Calculation (min) (ACUTE ONLY): 13 min  Charges:    $Self Care/Home Management: 8-22 PT General Charges $$ ACUTE PT VISIT: 1 Visit                     Joshua Zeringue R. PTA Acute Rehabilitation Services Office: [phone]   Catalina Antigua 08/12/2023, 12:41 PM

## 2023-08-13 ENCOUNTER — Telehealth: Payer: Self-pay

## 2023-08-13 DIAGNOSIS — M8008XD Age-related osteoporosis with current pathological fracture, vertebra(e), subsequent encounter for fracture with routine healing: Secondary | ICD-10-CM | POA: Diagnosis not present

## 2023-08-13 DIAGNOSIS — E1122 Type 2 diabetes mellitus with diabetic chronic kidney disease: Secondary | ICD-10-CM | POA: Diagnosis not present

## 2023-08-13 DIAGNOSIS — I129 Hypertensive chronic kidney disease with stage 1 through stage 4 chronic kidney disease, or unspecified chronic kidney disease: Secondary | ICD-10-CM | POA: Diagnosis not present

## 2023-08-13 DIAGNOSIS — N1831 Chronic kidney disease, stage 3a: Secondary | ICD-10-CM | POA: Diagnosis not present

## 2023-08-13 DIAGNOSIS — M4724 Other spondylosis with radiculopathy, thoracic region: Secondary | ICD-10-CM | POA: Diagnosis not present

## 2023-08-13 LAB — SURGICAL PATHOLOGY

## 2023-08-13 NOTE — Transitions of Care (Post Inpatient/ED Visit) (Signed)
   08/13/2023  Name: Jessica [name] MRN: [id number] DOB: Jan 29, 1930  Today's TOC FU Call Status: Today's TOC FU Call Status:: Unsuccessful Call (1st Attempt) Unsuccessful Call (1st Attempt) Date: 08/13/23  Attempted to reach the patient regarding the most recent Inpatient/ED visit.  Follow Up Plan: Additional outreach attempts will be made to reach the patient to complete the Transitions of Care (Post Inpatient/ED visit) call.   Signature  Robyne Peers, RN

## 2023-08-14 DIAGNOSIS — I129 Hypertensive chronic kidney disease with stage 1 through stage 4 chronic kidney disease, or unspecified chronic kidney disease: Secondary | ICD-10-CM | POA: Diagnosis not present

## 2023-08-14 DIAGNOSIS — M4724 Other spondylosis with radiculopathy, thoracic region: Secondary | ICD-10-CM | POA: Diagnosis not present

## 2023-08-14 DIAGNOSIS — N1831 Chronic kidney disease, stage 3a: Secondary | ICD-10-CM | POA: Diagnosis not present

## 2023-08-14 DIAGNOSIS — M8008XD Age-related osteoporosis with current pathological fracture, vertebra(e), subsequent encounter for fracture with routine healing: Secondary | ICD-10-CM | POA: Diagnosis not present

## 2023-08-14 DIAGNOSIS — E1122 Type 2 diabetes mellitus with diabetic chronic kidney disease: Secondary | ICD-10-CM | POA: Diagnosis not present

## 2023-08-14 NOTE — Telephone Encounter (Signed)
Verbal order was called in for patient PT

## 2023-08-16 LAB — CULTURE, BLOOD (ROUTINE X 2)
Culture: NO GROWTH
Culture: NO GROWTH

## 2023-08-17 ENCOUNTER — Telehealth: Payer: Self-pay

## 2023-08-17 NOTE — Transitions of Care (Post Inpatient/ED Visit) (Signed)
   08/17/2023  Name: Jessica [name] MRN: [id number] DOB: 02-13-1930  Today's TOC FU Call Status: Today's TOC FU Call Status:: Successful TOC FU Call Completed Unsuccessful Call (1st Attempt) Date: 08/13/23 [facility] FU Call Complete Date: 08/17/23 Patient's Name and Date of Birth confirmed.  Transition Care Management Follow-up Telephone Call Date of Discharge: 08/12/23 Discharge Facility: [facility]) Type of Discharge: Inpatient Admission Primary Inpatient Discharge Diagnosis:: T12 compression fracture How have you been since you were released from the hospital?: Better (call completed with patient's daughter, Jessica [name] who said her mother is doing well.  Her pain has disipated.) Any questions or concerns?: No  Items Reviewed: Did you receive and understand the discharge instructions provided?: Yes Medications obtained,verified, and reconciled?: Partial Review Completed Reason for Partial Mediation Review: Jessica [name] said they have all medications and she did not have any questions about the med regime and did not need to review the med list. She said she has thoroughliy reviewed the instructions from the hospital Any new allergies since your discharge?: No Dietary orders reviewed?: Yes Type of Diet Ordered:: heart healhy, low sodium Do you have support at home?: Yes People in Home: child(ren), adult Name of Support/Comfort Primary Source: Jessica [name] is her primary support but she said they also have addtional home health aides who assist  Medications Reviewed Today: Medications Reviewed Today   Medications were not reviewed in this encounter     Home Care and Equipment/Supplies: Were Home Health Services Ordered?: Yes Name of Home Health Agency:: [facility] - PT, OT Has Agency set up a time to come to your home?: Yes First Home Health Visit Date: 08/13/23 Any new equipment or medical supplies ordered?: No  Functional Questionnaire: Do you need assistance with bathing/showering or dressing?:  Yes (The aide assists as needed) Do you need assistance with meal preparation?: Yes (Jessica [name] and the aide assist) Do you need assistance with eating?: No Do you have difficulty maintaining continence: No Do you need assistance with getting out of bed/getting out of a chair/moving?: Yes (has RW to use with ambulation.  She also has a TLSO that she put on when walking around. She has been working with PT) Do you have difficulty managing or taking your medications?: Yes (Jessica [name] oversees the med regime)  Follow up appointments reviewed: PCP Follow-up appointment confirmed?: Yes Date of PCP follow-up appointment?: 08/25/23 [facility] Provider[facility] Follow-up appointment confirmed?: No Reason Specialist Follow-Up Not Confirmed: Patient has Specialist Provider Number and will Call for Appointment Jessica [name] has the number for the Dr Eulah Pont and she said will call to schedule the appointment after she sees what weather  the upcoming storm brings/) Do you need transportation to your follow-up appointment?: No Do you understand care options if your condition(s) worsen?: Yes-patient verbalized understanding    SIGNATURE Robyne Peers, RN

## 2023-08-18 ENCOUNTER — Telehealth: Payer: Self-pay | Admitting: Family Medicine

## 2023-08-18 DIAGNOSIS — M4724 Other spondylosis with radiculopathy, thoracic region: Secondary | ICD-10-CM | POA: Diagnosis not present

## 2023-08-18 DIAGNOSIS — I129 Hypertensive chronic kidney disease with stage 1 through stage 4 chronic kidney disease, or unspecified chronic kidney disease: Secondary | ICD-10-CM | POA: Diagnosis not present

## 2023-08-18 DIAGNOSIS — M8008XD Age-related osteoporosis with current pathological fracture, vertebra(e), subsequent encounter for fracture with routine healing: Secondary | ICD-10-CM | POA: Diagnosis not present

## 2023-08-18 DIAGNOSIS — N1831 Chronic kidney disease, stage 3a: Secondary | ICD-10-CM | POA: Diagnosis not present

## 2023-08-18 DIAGNOSIS — E1122 Type 2 diabetes mellitus with diabetic chronic kidney disease: Secondary | ICD-10-CM | POA: Diagnosis not present

## 2023-08-18 NOTE — Telephone Encounter (Signed)
 Jessica [name]

## 2023-08-20 ENCOUNTER — Telehealth: Payer: Self-pay | Admitting: Family Medicine

## 2023-08-20 DIAGNOSIS — M8008XD Age-related osteoporosis with current pathological fracture, vertebra(e), subsequent encounter for fracture with routine healing: Secondary | ICD-10-CM | POA: Diagnosis not present

## 2023-08-20 DIAGNOSIS — E1122 Type 2 diabetes mellitus with diabetic chronic kidney disease: Secondary | ICD-10-CM | POA: Diagnosis not present

## 2023-08-20 DIAGNOSIS — M4724 Other spondylosis with radiculopathy, thoracic region: Secondary | ICD-10-CM | POA: Diagnosis not present

## 2023-08-20 DIAGNOSIS — I129 Hypertensive chronic kidney disease with stage 1 through stage 4 chronic kidney disease, or unspecified chronic kidney disease: Secondary | ICD-10-CM | POA: Diagnosis not present

## 2023-08-20 DIAGNOSIS — N1831 Chronic kidney disease, stage 3a: Secondary | ICD-10-CM | POA: Diagnosis not present

## 2023-08-20 NOTE — Telephone Encounter (Signed)
A document form has been faxed: Home Health Certificate (Order ID [id number]), to be filled out by provider. Send document back via Fax within 5-days. Document is located in providers tray at front office.          Fax number:  [phone]

## 2023-08-21 NOTE — Telephone Encounter (Signed)
I faxed paper work back.

## 2023-08-25 ENCOUNTER — Inpatient Hospital Stay: Payer: Medicare Other | Admitting: Family Medicine

## 2023-08-25 DIAGNOSIS — N1831 Chronic kidney disease, stage 3a: Secondary | ICD-10-CM | POA: Diagnosis not present

## 2023-08-25 DIAGNOSIS — I129 Hypertensive chronic kidney disease with stage 1 through stage 4 chronic kidney disease, or unspecified chronic kidney disease: Secondary | ICD-10-CM | POA: Diagnosis not present

## 2023-08-25 DIAGNOSIS — E1122 Type 2 diabetes mellitus with diabetic chronic kidney disease: Secondary | ICD-10-CM | POA: Diagnosis not present

## 2023-08-25 DIAGNOSIS — M4724 Other spondylosis with radiculopathy, thoracic region: Secondary | ICD-10-CM | POA: Diagnosis not present

## 2023-08-25 DIAGNOSIS — M8008XD Age-related osteoporosis with current pathological fracture, vertebra(e), subsequent encounter for fracture with routine healing: Secondary | ICD-10-CM | POA: Diagnosis not present

## 2023-08-27 DIAGNOSIS — N1831 Chronic kidney disease, stage 3a: Secondary | ICD-10-CM | POA: Diagnosis not present

## 2023-08-27 DIAGNOSIS — M4724 Other spondylosis with radiculopathy, thoracic region: Secondary | ICD-10-CM | POA: Diagnosis not present

## 2023-08-27 DIAGNOSIS — E1122 Type 2 diabetes mellitus with diabetic chronic kidney disease: Secondary | ICD-10-CM | POA: Diagnosis not present

## 2023-08-27 DIAGNOSIS — M8008XD Age-related osteoporosis with current pathological fracture, vertebra(e), subsequent encounter for fracture with routine healing: Secondary | ICD-10-CM | POA: Diagnosis not present

## 2023-08-27 DIAGNOSIS — I129 Hypertensive chronic kidney disease with stage 1 through stage 4 chronic kidney disease, or unspecified chronic kidney disease: Secondary | ICD-10-CM | POA: Diagnosis not present

## 2023-08-28 ENCOUNTER — Telehealth: Payer: Self-pay | Admitting: Family Medicine

## 2023-08-28 NOTE — Telephone Encounter (Signed)
 Copied from CRM [id number]. Topic: Clinical - Home Health Verbal Orders >> Aug 28, 2023  4:01 PM Martha Clan wrote: Caller/Agency: [facility] Callback Number: [phone] Service Requested: Physical Therapy Frequency: Discharging from care on March 5th due to significant improvements. No longer requires home health. Any new concerns about the patient? No

## 2023-09-01 ENCOUNTER — Ambulatory Visit: Payer: Medicare Other | Admitting: Family Medicine

## 2023-09-02 DIAGNOSIS — H353221 Exudative age-related macular degeneration, left eye, with active choroidal neovascularization: Secondary | ICD-10-CM | POA: Diagnosis not present

## 2023-09-03 DIAGNOSIS — M8008XD Age-related osteoporosis with current pathological fracture, vertebra(e), subsequent encounter for fracture with routine healing: Secondary | ICD-10-CM | POA: Diagnosis not present

## 2023-09-03 DIAGNOSIS — M4724 Other spondylosis with radiculopathy, thoracic region: Secondary | ICD-10-CM | POA: Diagnosis not present

## 2023-09-03 DIAGNOSIS — E1122 Type 2 diabetes mellitus with diabetic chronic kidney disease: Secondary | ICD-10-CM | POA: Diagnosis not present

## 2023-09-03 DIAGNOSIS — N1831 Chronic kidney disease, stage 3a: Secondary | ICD-10-CM | POA: Diagnosis not present

## 2023-09-03 DIAGNOSIS — I129 Hypertensive chronic kidney disease with stage 1 through stage 4 chronic kidney disease, or unspecified chronic kidney disease: Secondary | ICD-10-CM | POA: Diagnosis not present

## 2023-09-08 ENCOUNTER — Telehealth: Payer: Self-pay | Admitting: Family

## 2023-09-08 ENCOUNTER — Ambulatory Visit (INDEPENDENT_AMBULATORY_CARE_PROVIDER_SITE_OTHER): Admitting: Family

## 2023-09-08 VITALS — BP 148/92 | HR 75 | Temp 97.9°F | Ht 63.0 in | Wt 156.0 lb

## 2023-09-08 DIAGNOSIS — I10 Essential (primary) hypertension: Secondary | ICD-10-CM | POA: Diagnosis not present

## 2023-09-08 DIAGNOSIS — M810 Age-related osteoporosis without current pathological fracture: Secondary | ICD-10-CM | POA: Diagnosis not present

## 2023-09-08 DIAGNOSIS — S22080D Wedge compression fracture of T11-T12 vertebra, subsequent encounter for fracture with routine healing: Secondary | ICD-10-CM | POA: Diagnosis not present

## 2023-09-08 DIAGNOSIS — N1831 Chronic kidney disease, stage 3a: Secondary | ICD-10-CM

## 2023-09-08 DIAGNOSIS — K5900 Constipation, unspecified: Secondary | ICD-10-CM

## 2023-09-08 DIAGNOSIS — E785 Hyperlipidemia, unspecified: Secondary | ICD-10-CM | POA: Diagnosis not present

## 2023-09-08 DIAGNOSIS — M1990 Unspecified osteoarthritis, unspecified site: Secondary | ICD-10-CM | POA: Diagnosis not present

## 2023-09-08 DIAGNOSIS — Z09 Encounter for follow-up examination after completed treatment for conditions other than malignant neoplasm: Secondary | ICD-10-CM

## 2023-09-08 DIAGNOSIS — Z862 Personal history of diseases of the blood and blood-forming organs and certain disorders involving the immune mechanism: Secondary | ICD-10-CM | POA: Diagnosis not present

## 2023-09-08 DIAGNOSIS — M549 Dorsalgia, unspecified: Secondary | ICD-10-CM | POA: Diagnosis not present

## 2023-09-08 DIAGNOSIS — E119 Type 2 diabetes mellitus without complications: Secondary | ICD-10-CM

## 2023-09-08 MED ORDER — GABAPENTIN 100 MG PO CAPS: 100.0000 mg | ORAL_CAPSULE | Freq: Three times a day (TID) | ORAL | 0 refills | Status: DC | PRN

## 2023-09-08 MED ORDER — PRAVASTATIN SODIUM 20 MG PO TABS
20.0000 mg | ORAL_TABLET | Freq: Every day | ORAL | 0 refills | Status: DC
Start: 2023-09-08 — End: 2023-10-13

## 2023-09-08 MED ORDER — METOPROLOL SUCCINATE ER 25 MG PO TB24
25.0000 mg | ORAL_TABLET | Freq: Every day | ORAL | 0 refills | Status: DC
Start: 2023-09-08 — End: 2023-10-13

## 2023-09-08 MED ORDER — AMLODIPINE BESYLATE 10 MG PO TABS: 10.0000 mg | ORAL_TABLET | Freq: Every day | ORAL | 0 refills | Status: DC

## 2023-09-08 MED ORDER — METHOCARBAMOL 500 MG PO TABS: 500.0000 mg | ORAL_TABLET | Freq: Three times a day (TID) | ORAL | 0 refills | Status: DC | PRN

## 2023-09-08 NOTE — Progress Notes (Signed)
 Patient ID: EMBREE BRAWLEY, female    DOB: 07/07/1929  MRN: [id number]  CC: Hospital Follow-Up  Subjective: Jessica [name] is a 89 y.o. female who presents for hospital follow-up. She is accompanied by a representative from [employer].  Her concerns today include:  08/04/2023 - 08/12/2023 [facility] per MD note:  Recommendations at discharge:    Wear TLSO brace as needed Continue gabapentin (Neurontin) up to 3 times daily as needed, Norco up with every 4 hours as needed for severe pain, methocarbamol (Robaxin) up to 3 times daily as needed for muscle spasms, and lidoderm patches as needed; do not drive or make important decisions while taking these medications Home health physical and occupational therapy have been ordered Continue aggressive bowel regimen as prescribed Follow up with orthopedic surgery (Dr. Eulah Pont) in 1-2 weeks Follow up with Dr. Andrey Campanile in 1-2 weeks   Discharge Diagnoses: Principal Problem:   T12 compression fracture (HCC) Active Problems:   Hyperlipidemia   HTN (hypertension)   Osteoarthritis   Constipation   Back pain   History of iron deficiency anemia   Chronic kidney disease, stage 3a (HCC)   Type 2 diabetes mellitus without complication, without long-term current use of insulin (HCC)   Assessment and Plan:   Back Pain with radiculopathy due to T10 and T12 compression fractures Failed conservative management with pain medications and TLSO brace Underwent kyphoplasty Currently able to get up to the bathroom without significant pain, feels like she will be successful at home at this time Continue TLSO as needed Home health PT/OT ordered Continue gabapentin, prn Norco, and lidoderm patch for now and wean off as no longer needed   Osteoporosis Pathology report with disrupted bone trabeculae with marked reactive changes consistent with fracture  Recommend outpatient discussion of osteoporosis treatment    Cystocele Bowel  regimen initiated     HTN  Continue amlodipine and Toprol XL Resume hydrochlorothiazide 12.5 mg daily   Leukocytosis Likely reactive in the setting of pain and acute stress Improving No current concern for infection Blood cultures negative at 24 hours   Hyperlipidemia Continue pravastatin    Type 2 Diabetes Mellitus without complication, without long-term current use of insulin  A1c is 6.0 Diet controlled There is no indication for ongoing monitoring of this issue   Constipation Bowel regimen was initiated with standing Miralax BID and Senna-S BID; Bisacodyl PRN moderate constipation  She was also given a smog enema and additional suppository which caused her to have a large bowel movement and she is now feeling better   CKD Stage 3a, improved Stable   Normocytic Anemia Mild, likely anemia of chronic disease Stable   Right Knee Pain Right knee DG done and showed " Severe patellofemoral osteoarthritis. Tiny joint effusion. No definitive acute fracture line is seen." Discussed with orthopedic surgery PA Earney Hamburg and he injected her knee with 40 mg of Solu-Medrol and 5 mL of 0.5% [facility] recommending outpatient follow-up with Dr. Eulah Pont in 1 to 2 weeks  Follow-Ups: Follow up with [facility]); The home health agency will contact you for the first home visit.   Today's office visit 09/08/2023: - Back pain persisting since hospital discharge. Denies recent trauma/injury and red flag symptoms. Doing well on current regimen. She has not established with Orthopedics.  - Doing well on Amlodipine and Metoprolol, no issues/concerns. States Hydrochlorothiazide was discontinued at hospital discharge. She does not complain of red flag symptoms  such as but not limited to chest pain, shortness of breath, worst headache of life, nausea/vomiting.  - Doing well on Atorvastatin, no issues/concerns.  - States Georganna Skeans, MD told her  that she will manage her chronic kidney disease. - Reports constipation improved. Denies red flag symptoms. - Needs medication refills of Amlodipine, Metoprolol, Pravastatin, Methocarbamol, and Gabapentin. Doing well on regimen, no issues/concerns.  Patient Active Problem List   Diagnosis Date Noted   T12 compression fracture (HCC) 08/04/2023   Hyponatremia 08/04/2023   Dehydration 08/04/2023   Type 2 diabetes mellitus without complication, without long-term current use of insulin (HCC) 08/01/2020   Palpitations 11/13/2019   Chronic kidney disease, stage 3a (HCC) 10/05/2019   History of iron deficiency anemia 02/16/2016   Chronic Cameron ulcers 11/20/2015   Hiatal hernia - 1o cm 11/20/2015   BMI 30.0-30.9,adult 11/14/2014   Lymphocytic colitis    Overactive bladder    Impaired hearing    Glaucoma    Cataracts, bilateral    Constipation    Back pain    GERD (gastroesophageal reflux disease)    Cystocele    Low bone mass    Prediabetes    Osteoarthritis    COLONIC POLYPS, ADENOMATOUS 09/09/2007   Hyperlipidemia 09/09/2007   HTN (hypertension) 09/09/2007     Current Outpatient Medications on File Prior to Visit  Medication Sig Dispense Refill   acetaminophen (TYLENOL) 325 MG tablet Take 2 tablets (650 mg total) by mouth every 6 (six) hours as needed for mild pain (pain score 1-3) (or Fever >/= 101).     esomeprazole (NEXIUM) 20 MG capsule Take 1 capsule (20 mg total) by mouth daily at 12 noon. (Patient taking differently: Take 20 mg by mouth every evening.) 90 capsule 3   feeding supplement (ENSURE ENLIVE / ENSURE PLUS) LIQD Take 237 mLs by mouth 2 (two) times daily between meals. 237 mL 12   GLUCOSAMINE PO Take 1 tablet by mouth every evening.     HYDROcodone-acetaminophen (NORCO/VICODIN) 5-325 MG tablet Take 1-2 tablets by mouth every 4 (four) hours as needed for moderate pain (pain score 4-6). 30 tablet 0   lidocaine (LIDODERM) 5 % Place 1 patch onto the skin daily. Remove &  Discard patch within 12 hours or as directed by MD 30 patch 0   Multiple Vitamin (MULTIVITAMIN WITH MINERALS) TABS tablet Take 1 tablet by mouth daily. 30 tablet 0   ondansetron (ZOFRAN) 4 MG tablet Take 1 tablet (4 mg total) by mouth every 6 (six) hours as needed for nausea. 20 tablet 0   polyethylene glycol powder (GLYCOLAX/MIRALAX) 17 GM/SCOOP powder Mix as directed and take 17 g by mouth daily. 238 g 0   senna-docusate (SENOKOT-S) 8.6-50 MG tablet Take 1 tablet by mouth at bedtime. 30 tablet 0   No current facility-administered medications on file prior to visit.    No Known Allergies  Social History   Socioeconomic History   Marital status: Widowed    Spouse name: n/a   Number of children: 2   Years of education: 11th grade   Highest education level: 11th grade  Occupational History   Occupation: Retired Print production planner  Tobacco Use   Smoking status: Former    Current packs/day: 0.00    Types: Cigarettes    Quit date: 07/07/1995    Years since quitting: 28.1   Smokeless tobacco: Never  Vaping Use   Vaping status: Never Used  Substance and Sexual Activity   Alcohol use: No  Drug use: No   Sexual activity: Never  Other Topics Concern   Not on file  Social History Narrative   Widowed - retired Chief of Staff   Daughter (a retired Pensions consultant) lives with patient - she has medical problems related to obesity.   Her son and his family live across the street.   2 caffeine drinks/day      Social Drivers of Corporate investment banker Strain: Low Risk  (10/25/2022)   Overall Financial Resource Strain (CARDIA)    Difficulty of Paying Living Expenses: Not very hard  Food Insecurity: No Food Insecurity (08/04/2023)   Hunger Vital Sign    Worried About Running Out of Food in the Last Year: Never true    Ran Out of Food in the Last Year: Never true  Transportation Needs: No Transportation Needs (08/04/2023)   PRAPARE - Administrator, Civil Service  (Medical): No    Lack of Transportation (Non-Medical): No  Physical Activity: Unknown (10/25/2022)   Exercise Vital Sign    Days of Exercise per Week: 0 days    Minutes of Exercise per Session: Not on file  Stress: No Stress Concern Present (10/25/2022)   [employer] of Occupational Health - Occupational Stress Questionnaire    Feeling of Stress : Not at all  Social Connections: Socially Isolated (08/05/2023)   Social Connection and Isolation Panel [NHANES]    Frequency of Communication with Friends and Family: Never    Frequency of Social Gatherings with Friends and Family: Twice a week    Attends Religious Services: Never    Database administrator or Organizations: No    Attends Banker Meetings: Never    Marital Status: Widowed  Intimate Partner Violence: Patient Declined (08/04/2023)   Humiliation, Afraid, Rape, and Kick questionnaire    Fear of Current or Ex-Partner: Patient declined    Emotionally Abused: Patient declined    Physically Abused: Patient declined    Sexually Abused: Patient declined    Family History  Problem Relation Age of Onset   Stroke Mother 88   Hypertension Mother    Heart disease Father 65       Endocarditis   Rheumatic fever Father    Obesity Daughter        unable to walk very far   Arthritis Daughter    Anesthesia problems Neg Hx    Hypotension Neg Hx    Pseudochol deficiency Neg Hx    Malignant hyperthermia Neg Hx    Colon cancer Neg Hx    Esophageal cancer Neg Hx    Pancreatic cancer Neg Hx    Rectal cancer Neg Hx    Stomach cancer Neg Hx     Past Surgical History:  Procedure Laterality Date   BASAL CELL CARCINOMA EXCISION Left 12/14/2014   LEFT temple   cataract surgery 04/25/13; 05/16/2013     COLONOSCOPY     IR KYPHO EA ADDL LEVEL THORACIC OR LUMBAR  08/10/2023   IR KYPHO THORACIC WITH BONE BIOPSY  08/10/2023   JOINT REPLACEMENT N/A    Phreesia 07/29/2020   TOE SURGERY     bilateral   TONSILLECTOMY AND  ADENOIDECTOMY     TOTAL HIP ARTHROPLASTY  09/24/2011   Procedure: TOTAL HIP ARTHROPLASTY;  Surgeon: Loreta Ave, MD;  Location: [facility] OR;  Service: Orthopedics;  Laterality: Right;    ROS: Review of Systems Negative except as stated above  PHYSICAL EXAM: BP (!) 148/92  Pulse 75   Temp 97.9 F (36.6 C) (Oral)   Ht 5\' 3"  (1.6 m)   Wt 156 lb (70.8 kg)   SpO2 94%   BMI 27.63 kg/m   Physical Exam HENT:     Head: Normocephalic and atraumatic.     Nose: Nose normal.     Mouth/Throat:     Mouth: Mucous membranes are moist.     Pharynx: Oropharynx is clear.  Eyes:     Extraocular Movements: Extraocular movements intact.     Conjunctiva/sclera: Conjunctivae normal.     Pupils: Pupils are equal, round, and reactive to light.  Cardiovascular:     Rate and Rhythm: Normal rate and regular rhythm.     Pulses: Normal pulses.     Heart sounds: Normal heart sounds.  Pulmonary:     Effort: Pulmonary effort is normal.     Breath sounds: Normal breath sounds.  Musculoskeletal:        General: Normal range of motion.     Cervical back: Normal, normal range of motion and neck supple.     Thoracic back: Normal.     Lumbar back: Normal.     Comments: TLSO brace in place.  Neurological:     General: No focal deficit present.     Mental Status: She is alert and oriented to person, place, and time.  Psychiatric:        Mood and Affect: Mood normal.        Behavior: Behavior normal.     ASSESSMENT AND PLAN: 1. Hospital discharge follow-up (Primary) - Reviewed hospital course, current medications, ensured proper follow-up in place, and addressed concerns.   2. Compression fracture of T12 vertebra with routine healing, subsequent encounter 3. Osteoarthritis, unspecified osteoarthritis type, unspecified site 4. Back pain, unspecified back location, unspecified back pain laterality, unspecified chronicity - Continue Methocarbamol and Gabapentin as prescribed. Counseled on medication  adherence/adverse effects. - Referral to Orthopedic Surgery for evaluation/management.  - Follow-up with primary provider in 1 week or sooner if needed. - Ambulatory referral to Orthopedic Surgery - methocarbamol (ROBAXIN) 500 MG tablet; Take 1 tablet (500 mg total) by mouth every 8 (eight) hours as needed for muscle spasms.  Dispense: 15 tablet; Refill: 0 - gabapentin (NEURONTIN) 100 MG capsule; Take 1 capsule (100 mg total) by mouth 3 (three) times daily as needed (nerve pain).  Dispense: 90 capsule; Refill: 0  5. Primary hypertension - Continue Amlodipine and Metoprolol Succinate as prescribed.  - Counseled on blood pressure goal of less than 140/90, low-sodium, DASH diet, medication compliance, 150 minutes of moderate intensity exercise per week as tolerated. Discussed medication compliance, adverse effects. - Follow-up with primary provider in 1 week or sooner if needed. - amLODipine (NORVASC) 10 MG tablet; Take 1 tablet (10 mg total) by mouth daily. Dx: I10  Dispense: 90 tablet; Refill: 0 - metoprolol succinate (TOPROL-XL) 25 MG 24 hr tablet; Take 1 tablet (25 mg total) by mouth daily. Dx: I10  Dispense: 90 tablet; Refill: 0  6. Type 2 diabetes mellitus without complication, without long-term current use of insulin (HCC) - Hemoglobin A1c 6.0% on 08/05/2023.  - Diet controlled. - Follow-up with primary provider as scheduled.  7. Hyperlipidemia, unspecified hyperlipidemia type - Continue Pravastatin as prescribed. Counseled on medication adherence/adverse effects.  - Follow-up with primary provider as scheduled. - pravastatin (PRAVACHOL) 20 MG tablet; Take 1 tablet (20 mg total) by mouth daily.  Dispense: 90 tablet; Refill: 0  8. Chronic kidney disease, stage 3a (  [facility]) - Patient states Georganna Skeans, MD told her that she would manage patient's chronic kidney disease.  9. History of iron deficiency anemia - Stable at hospital discharge.  10. Osteoporosis, unspecified osteoporosis type,  unspecified pathological fracture presence - Referral to Rheumatology for evaluation/management. - Ambulatory referral to Rheumatology  11. Constipation, unspecified constipation type - Patient reports improved. - Continue present management. - Follow-up with primary provider as scheduled.  Patient was given the opportunity to ask questions.  Patient verbalized understanding of the plan and was able to repeat key elements of the plan. Patient was given clear instructions to go to Emergency Department or return to medical center if symptoms don't improve, worsen, or new problems develop.The patient verbalized understanding.   Orders Placed This Encounter  Procedures   Ambulatory referral to Orthopedic Surgery   Ambulatory referral to Rheumatology     Requested Prescriptions   Signed Prescriptions Disp Refills   amLODipine (NORVASC) 10 MG tablet 90 tablet 0    Sig: Take 1 tablet (10 mg total) by mouth daily. Dx: I10   pravastatin (PRAVACHOL) 20 MG tablet 90 tablet 0    Sig: Take 1 tablet (20 mg total) by mouth daily.   metoprolol succinate (TOPROL-XL) 25 MG 24 hr tablet 90 tablet 0    Sig: Take 1 tablet (25 mg total) by mouth daily. Dx: I10   methocarbamol (ROBAXIN) 500 MG tablet 15 tablet 0    Sig: Take 1 tablet (500 mg total) by mouth every 8 (eight) hours as needed for muscle spasms.   gabapentin (NEURONTIN) 100 MG capsule 90 capsule 0    Sig: Take 1 capsule (100 mg total) by mouth 3 (three) times daily as needed (nerve pain).    Return in 1 week (on 09/15/2023) for Follow-Up or next available with Georganna Skeans, MD .  Rema Fendt, NP

## 2023-09-08 NOTE — Progress Notes (Signed)
 Patient states she want sot discuss pain in her back.

## 2023-09-11 DIAGNOSIS — M8008XD Age-related osteoporosis with current pathological fracture, vertebra(e), subsequent encounter for fracture with routine healing: Secondary | ICD-10-CM | POA: Diagnosis not present

## 2023-09-11 DIAGNOSIS — N1831 Chronic kidney disease, stage 3a: Secondary | ICD-10-CM | POA: Diagnosis not present

## 2023-09-11 DIAGNOSIS — E1122 Type 2 diabetes mellitus with diabetic chronic kidney disease: Secondary | ICD-10-CM | POA: Diagnosis not present

## 2023-09-11 DIAGNOSIS — I129 Hypertensive chronic kidney disease with stage 1 through stage 4 chronic kidney disease, or unspecified chronic kidney disease: Secondary | ICD-10-CM | POA: Diagnosis not present

## 2023-09-11 DIAGNOSIS — M4724 Other spondylosis with radiculopathy, thoracic region: Secondary | ICD-10-CM | POA: Diagnosis not present

## 2023-09-15 DIAGNOSIS — I129 Hypertensive chronic kidney disease with stage 1 through stage 4 chronic kidney disease, or unspecified chronic kidney disease: Secondary | ICD-10-CM | POA: Diagnosis not present

## 2023-09-15 DIAGNOSIS — M4724 Other spondylosis with radiculopathy, thoracic region: Secondary | ICD-10-CM | POA: Diagnosis not present

## 2023-09-15 DIAGNOSIS — M8008XD Age-related osteoporosis with current pathological fracture, vertebra(e), subsequent encounter for fracture with routine healing: Secondary | ICD-10-CM | POA: Diagnosis not present

## 2023-09-15 DIAGNOSIS — E1122 Type 2 diabetes mellitus with diabetic chronic kidney disease: Secondary | ICD-10-CM | POA: Diagnosis not present

## 2023-09-15 DIAGNOSIS — N1831 Chronic kidney disease, stage 3a: Secondary | ICD-10-CM | POA: Diagnosis not present

## 2023-09-16 ENCOUNTER — Ambulatory Visit: Admitting: Family Medicine

## 2023-09-17 ENCOUNTER — Ambulatory Visit: Admitting: Physician Assistant

## 2023-09-17 ENCOUNTER — Other Ambulatory Visit (INDEPENDENT_AMBULATORY_CARE_PROVIDER_SITE_OTHER): Payer: Self-pay

## 2023-09-17 ENCOUNTER — Encounter: Payer: Self-pay | Admitting: Physician Assistant

## 2023-09-17 DIAGNOSIS — M545 Low back pain, unspecified: Secondary | ICD-10-CM

## 2023-09-17 DIAGNOSIS — M549 Dorsalgia, unspecified: Secondary | ICD-10-CM | POA: Diagnosis not present

## 2023-09-17 DIAGNOSIS — G8929 Other chronic pain: Secondary | ICD-10-CM

## 2023-09-17 DIAGNOSIS — M1711 Unilateral primary osteoarthritis, right knee: Secondary | ICD-10-CM | POA: Insufficient documentation

## 2023-09-17 NOTE — Progress Notes (Signed)
 Office Visit Note   Patient: Jessica [name]           Date of Birth: 11/06/1929           MRN: [id number] Visit Date: 09/17/2023              Requested by: Rema Fendt, NP [address] [place],  [place] [address] PCP: Georganna Skeans, MD   Assessment & Plan: Visit Diagnoses:  1. Lumbar pain   2. Mid back pain, chronic   3. Unilateral primary osteoarthritis, right knee     Plan: Jessica [name] is a pleasant 89 year old woman who is accompanied by a friend.  She lives with her daughter.  They are both handicapped.  She was unfortunately hospitalized in February for a T10 and T12 compression fractures.  She had significant pain and was admitted to the hospital.  She says these occurred after her right knee gave out.  Since while she was in the hospital she was diagnosed with osteoarthritis of her right knee was given a brace.  She also underwent kyphoplasty at both T10 and T12.  She was engaging with physical therapy but now complains of burning all up and down her spine.  No change in bowel or bladder control.  She was also given a steroid injection into her knee in the hospital which helped but was not long-lasting more than a month.  I reviewed her x-rays today with Dr. Christell Constant she does have an inferior endplate abnormality at T9 which could be causing a lot of her pain.  Certainly a difficult problem.  He recommended that she could do pain management with her primary care.  From an orthopedic standpoint would not have any surgical or procedural options for her.  Could be referred to neurosurgery to see if there is something they might be able to do.  She does have osteoporosis has not had a bone density in a while and she did go through menopause in her 62s and never did any estrogen replacement I did recommend she have a bone density and vitamin D drawn she has declined this at this time.  She is interested in getting viscosupplementation as an option to see if her knee can feel  better may follow-up when this is approved This patient is diagnosed with osteoarthritis of the knee(s).    Radiographs show evidence of joint space narrowing, osteophytes, subchondral sclerosis and/or subchondral cysts.  This patient has knee pain which interferes with functional and activities of daily living.    This patient has experienced inadequate response, adverse effects and/or intolerance with conservative treatments such as acetaminophen, NSAIDS, topical creams, physical therapy or regular exercise, knee bracing and/or weight loss.   This patient has experienced inadequate response or has a contraindication to intra articular steroid injections for at least 3 months.   This patient is not scheduled to have a total knee replacement within 6 months of starting treatment with viscosupplementation.   Follow-Up Instructions: No follow-ups on file.   Orders:  Orders Placed This Encounter  Procedures   XR Thoracic Spine 2 View   XR Lumbar Spine 2-3 Views   No orders of the defined types were placed in this encounter.     Procedures: No procedures performed   Clinical Data: No additional findings.   Subjective: No chief complaint on file.   HPI Ms. Jessica [name] is a pleasant 89 year old woman who comes in today with a chief complaint of thoracic back pain and  pain burning going all the way up and down her spine.  Denies any paresthesias or radicular symptoms.  In February she underwent kyphoplasty at T10 and T12 when she was hospitalized with interventional radiology.  She was not given any specific follow-up other than to follow-up with rheumatology she was referred today for return of back pain.  She has trouble taking narcotic medication because of constipation.  She also is complaining of right knee pain which was the originating factor for why her knee gave out.  Pain in her back is better when she is lying down provoked when she is walking and standing  Review of Systems  All  other systems reviewed and are negative.    Objective: Vital Signs: There were no vitals taken for this visit.  Physical Exam Constitutional:      Appearance: Normal appearance.  Pulmonary:     Effort: Pulmonary effort is normal.  Neurological:     General: No focal deficit present.     Mental Status: She is alert and oriented to person, place, and time.  Psychiatric:        Mood and Affect: Mood normal.        Behavior: Behavior normal.    Ortho Exam Examination she ambulates well with her walker.  She has good strength in her lower extremities appropriate for age.  Compartments are soft and nontender she is neurologically intact in her lower extremities she has no particular step-offs in her spine but does have some focal tenderness over the mid spine in the area of her T10 kyphoplasty Examination of her right knee she does have some crepitus but no effusion no erythema no signs of infection compartments are soft and compressible Specialty Comments:  No specialty comments available.  Imaging: XR Thoracic Spine 2 View Result Date: 09/17/2023 Thoracic films were reviewed today and I did review these with Dr. Christell Constant.  She does have status post kyphoplasty at T10 and T12.  There is changes at the inferior endplate of T9.  Could be consistent with a compression injury  XR Lumbar Spine 2-3 Views Result Date: 09/17/2023 Multilevel degenerative changes cannot appreciate any acute fractures calcification of the inferior aorta without aneurysm.  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   Hiatal hernia - 1o cm 11/20/2015   BMI 30.0-30.9,adult 11/14/2014   Lymphocytic colitis    Overactive bladder    Impaired hearing    Glaucoma    Cataracts, bilateral    Constipation    Thoracic back pain    GERD (gastroesophageal reflux disease)    Cystocele    Low bone mass    Prediabetes    Osteoarthritis    COLONIC POLYPS, ADENOMATOUS 09/09/2007   Hyperlipidemia 09/09/2007   HTN (hypertension) 09/09/2007   Past Medical History:  Diagnosis Date   Allergy    Back pain    herniated disc   Basal cell carcinoma of face 11/2014   LEFT Temple   Cataracts, bilateral    bilateral removed   Colon polyps  Cystocele    Essential hypertension, benign    takes Amlodipine and Prinizide daily   GERD (gastroesophageal reflux disease)    takes Nexium daily   Glaucoma    pt denies   Hyperlipidemia    takes Pravastatin daily   Impaired hearing    Incontinence    occasionally   Iron deficiency anemia due to chronic blood loss 02/16/2016   Due to chronic cameron ulcers. See GI notes. Continue PPI and iron supplementation.   Low bone mass 02/2008   Lymphocytic colitis    Osteoarthritis    hip, knee; severe   Osteoporosis    Postoperative anemia due to acute blood loss 09/26/2011   Type II or unspecified type diabetes mellitus without mention of complication, not stated as uncontrolled 08/2010   Diet controlled    Family History  Problem Relation Age of Onset   Stroke Mother 32   Hypertension Mother    Heart disease Father 24       Endocarditis   Rheumatic fever Father    Obesity Daughter        unable to walk very far   Arthritis Daughter    Anesthesia problems Neg Hx    Hypotension Neg Hx    Pseudochol deficiency Neg Hx    Malignant hyperthermia Neg Hx    Colon cancer Neg Hx    Esophageal cancer Neg Hx    Pancreatic cancer Neg Hx    Rectal cancer Neg Hx    Stomach cancer  Neg Hx     Past Surgical History:  Procedure Laterality Date   BASAL CELL CARCINOMA EXCISION Left 12/14/2014   LEFT temple   cataract surgery 04/25/13; 05/16/2013     COLONOSCOPY     IR KYPHO EA ADDL LEVEL THORACIC OR LUMBAR  08/10/2023   IR KYPHO THORACIC WITH BONE BIOPSY  08/10/2023   JOINT REPLACEMENT N/A    [employer] 07/29/2020   TOE SURGERY     bilateral   TONSILLECTOMY AND ADENOIDECTOMY     TOTAL HIP ARTHROPLASTY  09/24/2011   Procedure: TOTAL HIP ARTHROPLASTY;  Surgeon: Loreta Ave, MD;  Location: [facility] OR;  Service: Orthopedics;  Laterality: Right;   Social History   Occupational History   Occupation: Retired Print production planner  Tobacco Use   Smoking status: Former    Current packs/day: 0.00    Types: Cigarettes    Quit date: 07/07/1995    Years since quitting: 28.2   Smokeless tobacco: Never  Vaping Use   Vaping status: Never Used  Substance and Sexual Activity   Alcohol use: No   Drug use: No   Sexual activity: Never

## 2023-09-29 ENCOUNTER — Ambulatory Visit: Admitting: Physical Medicine and Rehabilitation

## 2023-09-30 ENCOUNTER — Encounter: Payer: Self-pay | Admitting: Family Medicine

## 2023-09-30 ENCOUNTER — Ambulatory Visit (INDEPENDENT_AMBULATORY_CARE_PROVIDER_SITE_OTHER): Admitting: Family Medicine

## 2023-09-30 VITALS — BP 138/90 | HR 75 | Temp 97.5°F | Resp 16 | Ht 63.0 in | Wt 152.4 lb

## 2023-09-30 DIAGNOSIS — N1831 Chronic kidney disease, stage 3a: Secondary | ICD-10-CM

## 2023-09-30 DIAGNOSIS — G8929 Other chronic pain: Secondary | ICD-10-CM | POA: Diagnosis not present

## 2023-09-30 DIAGNOSIS — Z862 Personal history of diseases of the blood and blood-forming organs and certain disorders involving the immune mechanism: Secondary | ICD-10-CM

## 2023-09-30 MED ORDER — TRAMADOL HCL 50 MG PO TABS
50.0000 mg | ORAL_TABLET | Freq: Four times a day (QID) | ORAL | 0 refills | Status: DC | PRN
Start: 2023-09-30 — End: 2024-01-15

## 2023-09-30 MED ORDER — KETOROLAC TROMETHAMINE 60 MG/2ML IM SOLN
60.0000 mg | Freq: Once | INTRAMUSCULAR | Status: AC
  Administered 2023-09-30: 60 mg via INTRAMUSCULAR

## 2023-10-02 ENCOUNTER — Encounter: Payer: Self-pay | Admitting: Family Medicine

## 2023-10-02 LAB — CBC WITH DIFFERENTIAL/PLATELET
Basophils Absolute: 0.1 10*3/uL (ref 0.0–0.2)
Basos: 1 %
EOS (ABSOLUTE): 0.1 10*3/uL (ref 0.0–0.4)
Eos: 1 %
Hematocrit: 38.5 % (ref 34.0–46.6)
Hemoglobin: 12.3 g/dL (ref 11.1–15.9)
Immature Grans (Abs): 0 10*3/uL (ref 0.0–0.1)
Immature Granulocytes: 0 %
Lymphocytes Absolute: 3.6 10*3/uL — ABNORMAL HIGH (ref 0.7–3.1)
Lymphs: 32 %
MCH: 29.9 pg (ref 26.6–33.0)
MCHC: 31.9 g/dL (ref 31.5–35.7)
MCV: 93 fL (ref 79–97)
Monocytes Absolute: 0.8 10*3/uL (ref 0.1–0.9)
Monocytes: 7 %
Neutrophils Absolute: 6.7 10*3/uL (ref 1.4–7.0)
Neutrophils: 59 %
Platelets: 313 10*3/uL (ref 150–450)
RBC: 4.12 x10E6/uL (ref 3.77–5.28)
RDW: 14.7 % (ref 11.7–15.4)
WBC: 11.4 10*3/uL — ABNORMAL HIGH (ref 3.4–10.8)

## 2023-10-02 LAB — BASIC METABOLIC PANEL WITH GFR
BUN/Creatinine Ratio: 20 (ref 12–28)
BUN: 16 mg/dL (ref 10–36)
CO2: 17 mmol/L — ABNORMAL LOW (ref 20–29)
Calcium: 10.1 mg/dL (ref 8.7–10.3)
Chloride: 103 mmol/L (ref 96–106)
Creatinine, Ser: 0.82 mg/dL (ref 0.57–1.00)
Glucose: 78 mg/dL (ref 70–99)
Potassium: 4.1 mmol/L (ref 3.5–5.2)
Sodium: 143 mmol/L (ref 134–144)
eGFR: 67 mL/min/{1.73_m2} (ref >59)

## 2023-10-02 NOTE — Progress Notes (Signed)
 Established Patient Office Visit  Subjective    Patient ID: Jessica [name], female    DOB: Mar 15, 1930  Age: 89 y.o. MRN: [id number]  CC:  Chief Complaint  Patient presents with   discuss pain medication    Blood work for kidney    HPI Jessica [name] presents with complaint of back pain. Patient fell and broke vertebrae in her back. She is now in a back brace and using her rollator as recommended.   Outpatient Encounter Medications as of 09/30/2023  Medication Sig   acetaminophen (TYLENOL) 325 MG tablet Take 2 tablets (650 mg total) by mouth every 6 (six) hours as needed for mild pain (pain score 1-3) (or Fever >/= 101).   amLODipine (NORVASC) 10 MG tablet Take 1 tablet (10 mg total) by mouth daily. Dx: I10   esomeprazole (NEXIUM) 20 MG capsule Take 1 capsule (20 mg total) by mouth daily at 12 noon. (Patient taking differently: Take 20 mg by mouth every evening.)   gabapentin (NEURONTIN) 100 MG capsule Take 1 capsule (100 mg total) by mouth 3 (three) times daily as needed (nerve pain).   GLUCOSAMINE PO Take 1 tablet by mouth every evening.   methocarbamol (ROBAXIN) 500 MG tablet Take 1 tablet (500 mg total) by mouth every 8 (eight) hours as needed for muscle spasms.   metoprolol succinate (TOPROL-XL) 25 MG 24 hr tablet Take 1 tablet (25 mg total) by mouth daily. Dx: I10   Multiple Vitamin (MULTIVITAMIN WITH MINERALS) TABS tablet Take 1 tablet by mouth daily.   polyethylene glycol powder (GLYCOLAX/MIRALAX) 17 GM/SCOOP powder Mix as directed and take 17 g by mouth daily.   pravastatin (PRAVACHOL) 20 MG tablet Take 1 tablet (20 mg total) by mouth daily.   senna-docusate (SENOKOT-S) 8.6-50 MG tablet Take 1 tablet by mouth at bedtime.   traMADol (ULTRAM) 50 MG tablet Take 1 tablet (50 mg total) by mouth every 6 (six) hours as needed.   feeding supplement (ENSURE ENLIVE / ENSURE PLUS) LIQD Take 237 mLs by mouth 2 (two) times daily between meals.   HYDROcodone-acetaminophen  (NORCO/VICODIN) 5-325 MG tablet Take 1-2 tablets by mouth every 4 (four) hours as needed for moderate pain (pain score 4-6). (Patient not taking: Reported on 09/30/2023)   lidocaine (LIDODERM) 5 % Place 1 patch onto the skin daily. Remove & Discard patch within 12 hours or as directed by MD   ondansetron (ZOFRAN) 4 MG tablet Take 1 tablet (4 mg total) by mouth every 6 (six) hours as needed for nausea.   [EXPIRED] ketorolac (TORADOL) injection 60 mg    No facility-administered encounter medications on file as of 09/30/2023.    Past Medical History:  Diagnosis Date   Allergy    Back pain    herniated disc   Basal cell carcinoma of face 11/2014   LEFT Temple   Cataracts, bilateral    bilateral removed   Colon polyps    Cystocele    Essential hypertension, benign    takes Amlodipine and Prinizide daily   GERD (gastroesophageal reflux disease)    takes Nexium daily   Glaucoma    pt denies   Hyperlipidemia    takes Pravastatin daily   Impaired hearing    Incontinence    occasionally   Iron deficiency anemia due to chronic blood loss 02/16/2016   Due to chronic cameron ulcers. See GI notes. Continue PPI and iron supplementation.   Low bone mass 02/2008   Lymphocytic colitis  Osteoarthritis    hip, knee; severe   Osteoporosis    Postoperative anemia due to acute blood loss 09/26/2011   Type II or unspecified type diabetes mellitus without mention of complication, not stated as uncontrolled 08/2010   Diet controlled    Past Surgical History:  Procedure Laterality Date   BASAL CELL CARCINOMA EXCISION Left 12/14/2014   LEFT temple   cataract surgery 04/25/13; 05/16/2013     COLONOSCOPY     IR KYPHO EA ADDL LEVEL THORACIC OR LUMBAR  08/10/2023   IR KYPHO THORACIC WITH BONE BIOPSY  08/10/2023   JOINT REPLACEMENT N/A    Phreesia 07/29/2020   TOE SURGERY     bilateral   TONSILLECTOMY AND ADENOIDECTOMY     TOTAL HIP ARTHROPLASTY  09/24/2011   Procedure: TOTAL HIP ARTHROPLASTY;  Surgeon:  Loreta Ave, MD;  Location: [facility] OR;  Service: Orthopedics;  Laterality: Right;    Family History  Problem Relation Age of Onset   Stroke Mother 14   Hypertension Mother    Heart disease Father 43       Endocarditis   Rheumatic fever Father    Obesity Daughter        unable to walk very far   Arthritis Daughter    Anesthesia problems Neg Hx    Hypotension Neg Hx    Pseudochol deficiency Neg Hx    Malignant hyperthermia Neg Hx    Colon cancer Neg Hx    Esophageal cancer Neg Hx    Pancreatic cancer Neg Hx    Rectal cancer Neg Hx    Stomach cancer Neg Hx     Social History   Socioeconomic History   Marital status: Widowed    Spouse name: n/a   Number of children: 2   Years of education: 11th grade   Highest education level: 12th grade  Occupational History   Occupation: Retired Print production planner  Tobacco Use   Smoking status: Former    Current packs/day: 0.00    Types: Cigarettes    Quit date: 07/07/1995    Years since quitting: 28.2   Smokeless tobacco: Never  Vaping Use   Vaping status: Never Used  Substance and Sexual Activity   Alcohol use: No   Drug use: No   Sexual activity: Never  Other Topics Concern   Not on file  Social History Narrative   Widowed - retired Chief of Staff   Daughter (a retired Pensions consultant) lives with patient - she has medical problems related to obesity.   Her son and his family live across the street.   2 caffeine drinks/day      Social Drivers of Corporate investment banker Strain: Low Risk  (09/12/2023)   Overall Financial Resource Strain (CARDIA)    Difficulty of Paying Living Expenses: Not hard at all  Food Insecurity: No Food Insecurity (09/12/2023)   Hunger Vital Sign    Worried About Running Out of Food in the Last Year: Never true    Ran Out of Food in the Last Year: Never true  Transportation Needs: No Transportation Needs (09/12/2023)   PRAPARE - Administrator, Civil Service (Medical): No     Lack of Transportation (Non-Medical): No  Physical Activity: Unknown (09/12/2023)   Exercise Vital Sign    Days of Exercise per Week: 0 days    Minutes of Exercise per Session: Not on file  Stress: No Stress Concern Present (09/12/2023)   [employer] of Occupational Health -  Occupational Stress Questionnaire    Feeling of Stress : Not at all  Social Connections: Socially Isolated (09/12/2023)   Social Connection and Isolation Panel [NHANES]    Frequency of Communication with Friends and Family: Never    Frequency of Social Gatherings with Friends and Family: Once a week    Attends Religious Services: Never    Database administrator or Organizations: No    Attends Banker Meetings: Never    Marital Status: Widowed  Intimate Partner Violence: Patient Declined (08/04/2023)   Humiliation, Afraid, Rape, and Kick questionnaire    Fear of Current or Ex-Partner: Patient declined    Emotionally Abused: Patient declined    Physically Abused: Patient declined    Sexually Abused: Patient declined    Review of Systems  All other systems reviewed and are negative.       Objective    BP (!) 138/90   Pulse 75   Temp (!) 97.5 F (36.4 C) (Oral)   Resp 16   Ht 5\' 3"  (1.6 m)   Wt 152 lb 6.4 oz (69.1 kg)   SpO2 95%   BMI 27.00 kg/m   Physical Exam Vitals and nursing note reviewed.  Constitutional:      General: She is not in acute distress. Cardiovascular:     Rate and Rhythm: Normal rate and regular rhythm.  Pulmonary:     Effort: Pulmonary effort is normal.     Breath sounds: Normal breath sounds.  Abdominal:     Palpations: Abdomen is soft.     Tenderness: There is no abdominal tenderness.  Musculoskeletal:     Comments: Utilizing seated walker - patient with back brace on  Neurological:     General: No focal deficit present.     Mental Status: She is alert and oriented to person, place, and time.         Assessment & Plan:   Other chronic pain -      Ketorolac Tromethamine  Chronic kidney disease, stage 3a (HCC) -     Basic metabolic panel with GFR  History of iron deficiency anemia -     CBC with Differential/Platelet  Other orders -     traMADol HCl; Take 1 tablet (50 mg total) by mouth every 6 (six) hours as needed.  Dispense: 60 tablet; Refill: 0     No follow-ups on file.   Tommie Raymond, MD

## 2023-10-13 ENCOUNTER — Telehealth: Admitting: Family Medicine

## 2023-10-13 DIAGNOSIS — I10 Essential (primary) hypertension: Secondary | ICD-10-CM

## 2023-10-13 DIAGNOSIS — E785 Hyperlipidemia, unspecified: Secondary | ICD-10-CM | POA: Diagnosis not present

## 2023-10-13 DIAGNOSIS — G8929 Other chronic pain: Secondary | ICD-10-CM | POA: Diagnosis not present

## 2023-10-13 MED ORDER — AMLODIPINE BESYLATE 10 MG PO TABS: 10.0000 mg | ORAL_TABLET | Freq: Every day | ORAL | 0 refills | Status: DC

## 2023-10-13 MED ORDER — PRAVASTATIN SODIUM 20 MG PO TABS: 20.0000 mg | ORAL_TABLET | Freq: Every day | ORAL | 0 refills | Status: DC

## 2023-10-13 MED ORDER — METOPROLOL SUCCINATE ER 25 MG PO TB24: 25.0000 mg | ORAL_TABLET | Freq: Every day | ORAL | 0 refills | Status: DC

## 2023-10-14 ENCOUNTER — Encounter: Payer: Self-pay | Admitting: Family Medicine

## 2023-10-14 ENCOUNTER — Telehealth: Payer: Self-pay

## 2023-10-14 ENCOUNTER — Other Ambulatory Visit: Payer: Self-pay | Admitting: Family Medicine

## 2023-10-14 NOTE — Telephone Encounter (Signed)
 VOB has been submitted for Durolane, right knee

## 2023-10-14 NOTE — Progress Notes (Signed)
 Virtual Visit via Video Note  I connected with Jessica [name] on 10/14/23 at 11:00 AM EDT by a video enabled telemedicine application and verified that I am speaking with the correct person using two identifiers.  Location: Patient: Jessica [name] Provider: [place]   I discussed the limitations of evaluation and management by telemedicine and the availability of in person appointments. The patient expressed understanding and agreed to proceed.  History of Present Illness:  Patient  for follow up of back pain and reports that the tramadol is working well and would like a refill. Her back pain is improving.    Observations/Objective:   Assessment and Plan: 1. Other chronic pain (Primary) Improving. Tramadol refilled.  2. Primary hypertension  - metoprolol succinate (TOPROL-XL) 25 MG 24 hr tablet; Take 1 tablet (25 mg total) by mouth daily. Dx: I10  Dispense: 90 tablet; Refill: 0 - amLODipine (NORVASC) 10 MG tablet; Take 1 tablet (10 mg total) by mouth daily. Dx: I10  Dispense: 90 tablet; Refill: 0  3. Hyperlipidemia, unspecified hyperlipidemia type  - pravastatin (PRAVACHOL) 20 MG tablet; Take 1 tablet (20 mg total) by mouth daily.  Dispense: 90 tablet; Refill: 0   Follow Up Instructions:    I discussed the assessment and treatment plan with the patient. The patient was provided an opportunity to ask questions and all were answered. The patient agreed with the plan and demonstrated an understanding of the instructions.   The patient was advised to call back or seek an in-person evaluation if the symptoms worsen or if the condition fails to improve as anticipated.  I provided 5 minutes of non-face-to-face time during this encounter.   Arlo Lama, MD

## 2023-10-14 NOTE — Telephone Encounter (Signed)
-----   Message from [facility] Latoria L sent at 09/17/2023  2:30 PM EDT ----- Regarding: gel injections Per Maryanne can we please get Authorization for this patient for visco supplementation for the right knee

## 2023-11-12 ENCOUNTER — Encounter: Payer: Self-pay | Admitting: Family Medicine

## 2023-11-13 ENCOUNTER — Other Ambulatory Visit: Payer: Self-pay

## 2023-11-13 ENCOUNTER — Telehealth: Payer: Self-pay

## 2023-11-13 DIAGNOSIS — M1711 Unilateral primary osteoarthritis, right knee: Secondary | ICD-10-CM

## 2023-11-13 NOTE — Telephone Encounter (Signed)
 Talked with patients daughter Carmelina Chinchilla and scheduled patient for gel injection.

## 2023-11-17 ENCOUNTER — Encounter: Payer: Self-pay | Admitting: Physician Assistant

## 2023-11-17 ENCOUNTER — Ambulatory Visit: Admitting: Physician Assistant

## 2023-11-17 DIAGNOSIS — M1711 Unilateral primary osteoarthritis, right knee: Secondary | ICD-10-CM | POA: Diagnosis not present

## 2023-11-17 MED ORDER — SODIUM HYALURONATE 60 MG/3ML IX PRSY
60.0000 mg | PREFILLED_SYRINGE | INTRA_ARTICULAR | Status: AC | PRN
  Administered 2023-11-17: 60 mg via INTRA_ARTICULAR

## 2023-11-17 NOTE — Progress Notes (Signed)
 Office Visit Note   Patient: Jessica [name]           Date of Birth: 01-18-1930           MRN: [id number] Visit Date: 11/17/2023              Requested by: Abraham Abo, MD [address] [place],  [place] [address] PCP: Abraham Abo, MD  No chief complaint on file.     HPI: Patient is a 89 year old woman with a history of arthritis of her right knee.  She has been approved for Duralone injection of her right knee  Assessment & Plan: Visit Diagnoses:  1. Unilateral primary osteoarthritis, right knee     Plan: Patient injected without difficulty may follow-up as needed  Follow-Up Instructions: Return if symptoms worsen or fail to improve.   Ortho Exam  Patient is alert, oriented, no adenopathy, well-dressed, normal affect, normal respiratory effort. Right knee no erythema no effusion compartments are soft and compressible she is neurovascular intact  Imaging: No results found. No images are attached to the encounter.  Labs: Lab Results  Component Value Date   HGBA1C 6.0 (H) 08/05/2023   HGBA1C 5.8 (A) 10/29/2022   HGBA1C 5.6 04/30/2022   ESRSEDRATE 65 (H) 08/12/2023   CRP 4.5 (H) 08/12/2023   REPTSTATUS 08/16/2023 FINAL 08/11/2023   CULT  08/11/2023    NO GROWTH 5 DAYS Performed at [facility], [address] [place], [place] [address]      Lab Results  Component Value Date   ALBUMIN 3.2 (L) 08/12/2023   ALBUMIN 3.3 (L) 08/11/2023   ALBUMIN 3.2 (L) 08/10/2023    Lab Results  Component Value Date   MG 2.1 08/12/2023   MG 2.1 08/11/2023   MG 2.2 08/10/2023   Lab Results  Component Value Date   VD25OH 44.73 08/04/2023   VD25OH 38 07/18/2014    No results found for: "PREALBUMIN"    Latest Ref Rng & Units 09/30/2023    2:08 PM 08/12/2023    5:43 AM 08/11/2023    3:48 PM  CBC EXTENDED  WBC 3.4 - 10.8 x10E3/uL 11.4  11.9  14.1   RBC 3.77 - 5.28 x10E6/uL 4.12  3.73  3.86   Hemoglobin 11.1 - 15.9 g/dL 25.3  66.4  40.3   HCT  34.0 - 46.6 % 38.5  33.8  34.9   Platelets 150 - 450 x10E3/uL 313  319  354   NEUT# 1.4 - 7.0 x10E3/uL 6.7  6.5  8.9   Lymph# 0.7 - 3.1 x10E3/uL 3.6  3.7  3.4      There is no height or weight on file to calculate BMI.  Orders:  No orders of the defined types were placed in this encounter.  No orders of the defined types were placed in this encounter.    Procedures: Large Joint Inj on 11/17/2023 1:19 PM Indications: pain and diagnostic evaluation Details: 22 G 1.5 in needle, anteromedial approach  Arthrogram: No  Medications: 60 mg Sodium Hyaluronate 60 MG/3ML Outcome: tolerated well, no immediate complications Procedure, treatment alternatives, risks and benefits explained, specific risks discussed. Consent was given by the patient.    Clinical Data: No additional findings.  ROS:  All other systems negative, except as noted in the HPI. Review of Systems  Objective: Vital Signs: There were no vitals taken for this visit.  Specialty Comments:  No specialty comments available.  PMFS History: Patient Active Problem List  Diagnosis Date Noted  . Unilateral primary osteoarthritis, right knee 09/17/2023  . T12 compression fracture (HCC) 08/04/2023  . Hyponatremia 08/04/2023  . Dehydration 08/04/2023  . Type 2 diabetes mellitus without complication, without long-term current use of insulin  (HCC) 08/01/2020  . Palpitations 11/13/2019  . Chronic kidney disease, stage 3a (HCC) 10/05/2019  . History of iron deficiency anemia 02/16/2016  . 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 Hiatal hernia - 1o cm 11/20/2015  . BMI 30.0-30.9,adult 11/14/2014  . Lymphocytic colitis   . Overactive bladder   . Impaired hearing   . Glaucoma   . Cataracts, bilateral   . Constipation   . Thoracic back pain   . GERD (gastroesophageal reflux disease)   . Cystocele   . Low bone mass   . Prediabetes   . Osteoarthritis   . COLONIC POLYPS, ADENOMATOUS 09/09/2007  . Hyperlipidemia  09/09/2007  . HTN (hypertension) 09/09/2007   Past Medical History:  Diagnosis Date  . Allergy   . Back pain    herniated disc  . Basal cell carcinoma of face 11/2014   LEFT Temple  . Cataracts, bilateral    bilateral removed  . Colon polyps   . Cystocele   . Essential hypertension, benign    takes Amlodipine  and Prinizide daily  . GERD (gastroesophageal reflux disease)    takes Nexium  daily  . Glaucoma    pt denies  . Hyperlipidemia    takes Pravastatin  daily  . Impaired hearing   . Incontinence    occasionally  . Iron deficiency anemia due to chronic blood loss 02/16/2016   Due to chronic cameron ulcers. See GI notes. Continue PPI and iron supplementation.  . Low bone mass 02/2008  . Lymphocytic colitis   . Osteoarthritis    hip, knee; severe  . Osteoporosis   . Postoperative anemia due to acute blood loss 09/26/2011  . Type II or unspecified type diabetes mellitus without mention of complication, not stated as uncontrolled 08/2010   Diet controlled    Family History  Problem Relation Age of Onset  . Stroke Mother 51  . Hypertension Mother   . Heart disease Father 68       Endocarditis  . Rheumatic fever Father   . Obesity Daughter        unable to walk very far  . Arthritis Daughter   . Anesthesia problems Neg Hx   . Hypotension Neg Hx   . Pseudochol deficiency Neg Hx   . Malignant hyperthermia Neg Hx   . Colon cancer Neg Hx   . Esophageal cancer Neg Hx   . Pancreatic cancer Neg Hx   . Rectal cancer Neg Hx   . Stomach cancer Neg Hx     Past Surgical History:  Procedure Laterality Date  . BASAL CELL CARCINOMA EXCISION Left 12/14/2014   LEFT temple  . cataract surgery 04/25/13; 05/16/2013    . COLONOSCOPY    . IR KYPHO EA ADDL LEVEL THORACIC OR LUMBAR  08/10/2023  . IR KYPHO THORACIC WITH BONE BIOPSY  08/10/2023  . JOINT REPLACEMENT N/A    Phreesia 07/29/2020  . TOE SURGERY     bilateral  . TONSILLECTOMY AND ADENOIDECTOMY    . TOTAL HIP ARTHROPLASTY   09/24/2011   Procedure: TOTAL HIP ARTHROPLASTY;  Surgeon: Ferd Householder, MD;  Location: [facility] OR;  Service: Orthopedics;  Laterality: Right;   Social History   Occupational History  . Occupation: Retired Print production planner  Tobacco Use  . Smoking  status: Former    Current packs/day: 0.00    Types: Cigarettes    Quit date: 07/07/1995    Years since quitting: 28.3  . Smokeless tobacco: Never  Vaping Use  . Vaping status: Never Used  Substance and Sexual Activity  . Alcohol use: No  . Drug use: No  . Sexual activity: Never

## 2024-01-09 ENCOUNTER — Other Ambulatory Visit: Payer: Self-pay | Admitting: Family

## 2024-01-09 DIAGNOSIS — I10 Essential (primary) hypertension: Secondary | ICD-10-CM

## 2024-01-14 ENCOUNTER — Other Ambulatory Visit: Payer: Self-pay | Admitting: Family Medicine

## 2024-01-16 ENCOUNTER — Other Ambulatory Visit: Payer: Self-pay | Admitting: Family Medicine

## 2024-01-16 DIAGNOSIS — I10 Essential (primary) hypertension: Secondary | ICD-10-CM

## 2024-01-19 ENCOUNTER — Ambulatory Visit (INDEPENDENT_AMBULATORY_CARE_PROVIDER_SITE_OTHER): Admitting: Family Medicine

## 2024-01-19 VITALS — BP 157/89 | HR 77 | Ht 63.0 in | Wt 149.2 lb

## 2024-01-19 DIAGNOSIS — I10 Essential (primary) hypertension: Secondary | ICD-10-CM | POA: Diagnosis not present

## 2024-01-19 DIAGNOSIS — G8929 Other chronic pain: Secondary | ICD-10-CM | POA: Diagnosis not present

## 2024-01-19 DIAGNOSIS — E785 Hyperlipidemia, unspecified: Secondary | ICD-10-CM | POA: Diagnosis not present

## 2024-01-19 DIAGNOSIS — M545 Low back pain, unspecified: Secondary | ICD-10-CM | POA: Diagnosis not present

## 2024-01-19 DIAGNOSIS — M810 Age-related osteoporosis without current pathological fracture: Secondary | ICD-10-CM

## 2024-01-19 DIAGNOSIS — H9193 Unspecified hearing loss, bilateral: Secondary | ICD-10-CM

## 2024-01-20 ENCOUNTER — Encounter: Payer: Self-pay | Admitting: Family Medicine

## 2024-01-20 NOTE — Progress Notes (Signed)
 Established Patient Office Visit  Subjective    Patient ID: Jessica [name], female    DOB: Apr 29, 1930  Age: 89 y.o. MRN: [id number]  CC:  Chief Complaint  Patient presents with   Medical Management of Chronic Issues    HPI [name] presents for routine follow up of chronic med issues including chronic back pain and hypertension. Patient reports med compliance and denies acute complaints.   Outpatient Encounter Medications as of 01/19/2024  Medication Sig   acetaminophen  (TYLENOL ) 325 MG tablet Take 2 tablets (650 mg total) by mouth every 6 (six) hours as needed for mild pain (pain score 1-3) (or Fever >/= 101).   amLODipine  (NORVASC ) 10 MG tablet TAKE 1 TABLET (10 MG TOTAL) BY MOUTH DAILY. DX: I10   esomeprazole  (NEXIUM ) 20 MG capsule Take 1 capsule (20 mg total) by mouth daily at 12 noon. (Patient taking differently: Take 20 mg by mouth every evening.)   gabapentin  (NEURONTIN ) 100 MG capsule Take 1 capsule (100 mg total) by mouth 3 (three) times daily as needed (nerve pain).   GLUCOSAMINE PO Take 1 tablet by mouth every evening.   metoprolol  succinate (TOPROL -XL) 25 MG 24 hr tablet Take 1 tablet (25 mg total) by mouth daily. Dx: I10   Multiple Vitamin (MULTIVITAMIN WITH MINERALS) TABS tablet Take 1 tablet by mouth daily.   polyethylene glycol powder (GLYCOLAX /MIRALAX ) 17 GM/SCOOP powder Mix as directed and take 17 g by mouth daily.   pravastatin  (PRAVACHOL ) 20 MG tablet Take 1 tablet (20 mg total) by mouth daily.   senna-docusate (SENOKOT-S) 8.6-50 MG tablet Take 1 tablet by mouth at bedtime.   traMADol  (ULTRAM ) 50 MG tablet TAKE 1 TABLET BY MOUTH EVERY 6 HOURS AS NEEDED.   feeding supplement (ENSURE ENLIVE / ENSURE PLUS) LIQD Take 237 mLs by mouth 2 (two) times daily between meals.   No facility-administered encounter medications on file as of 01/19/2024.    Past Medical History:  Diagnosis Date   Allergy    Back pain    herniated disc   Basal cell carcinoma of face  11/2014   LEFT Temple   Cataracts, bilateral    bilateral removed   Colon polyps    Cystocele    Essential hypertension, benign    takes Amlodipine  and Prinizide daily   GERD (gastroesophageal reflux disease)    takes Nexium  daily   Glaucoma    pt denies   Hyperlipidemia    takes Pravastatin  daily   Impaired hearing    Incontinence    occasionally   Iron deficiency anemia due to chronic blood loss 02/16/2016   Due to chronic cameron ulcers. See GI notes. Continue PPI and iron supplementation.   Low bone mass 02/2008   Lymphocytic colitis    Osteoarthritis    hip, knee; severe   Osteoporosis    Postoperative anemia due to acute blood loss 09/26/2011   Type II or unspecified type diabetes mellitus without mention of complication, not stated as uncontrolled 08/2010   Diet controlled    Past Surgical History:  Procedure Laterality Date   BASAL CELL CARCINOMA EXCISION Left 12/14/2014   LEFT temple   cataract surgery 04/25/13; 05/16/2013     COLONOSCOPY     IR KYPHO EA ADDL LEVEL THORACIC OR LUMBAR  08/10/2023   IR KYPHO THORACIC WITH BONE BIOPSY  08/10/2023   JOINT REPLACEMENT N/A    Phreesia 07/29/2020   TOE SURGERY     bilateral   TONSILLECTOMY AND ADENOIDECTOMY  TOTAL HIP ARTHROPLASTY  09/24/2011   Procedure: TOTAL HIP ARTHROPLASTY;  Surgeon: Toribio JULIANNA Chancy, MD;  Location: [facility] OR;  Service: Orthopedics;  Laterality: Right;    Family History  Problem Relation Age of Onset   Stroke Mother 41   Hypertension Mother    Heart disease Father 66       Endocarditis   Rheumatic fever Father    Obesity Daughter        unable to walk very far   Arthritis Daughter    Anesthesia problems Neg Hx    Hypotension Neg Hx    Pseudochol deficiency Neg Hx    Malignant hyperthermia Neg Hx    Colon cancer Neg Hx    Esophageal cancer Neg Hx    Pancreatic cancer Neg Hx    Rectal cancer Neg Hx    Stomach cancer Neg Hx     Social History   Socioeconomic History   Marital status:  Widowed    Spouse name: n/a   Number of children: 2   Years of education: 11th grade   Highest education level: 12th grade  Occupational History   Occupation: Retired Print production planner  Tobacco Use   Smoking status: Former    Current packs/day: 0.00    Types: Cigarettes    Quit date: 07/07/1995    Years since quitting: 28.5   Smokeless tobacco: Never  Vaping Use   Vaping status: Never Used  Substance and Sexual Activity   Alcohol use: No   Drug use: No   Sexual activity: Never  Other Topics Concern   Not on file  Social History Narrative   Widowed - retired Chief of Staff   Daughter (a retired Pensions consultant) lives with patient - she has medical problems related to obesity.   Her son and his family live across the street.   2 caffeine drinks/day      Social Drivers of Corporate investment banker Strain: Low Risk  (01/14/2024)   Overall Financial Resource Strain (CARDIA)    Difficulty of Paying Living Expenses: Not hard at all  Food Insecurity: No Food Insecurity (01/14/2024)   Hunger Vital Sign    Worried About Running Out of Food in the Last Year: Never true    Ran Out of Food in the Last Year: Never true  Transportation Needs: No Transportation Needs (01/14/2024)   PRAPARE - Administrator, Civil Service (Medical): No    Lack of Transportation (Non-Medical): No  Physical Activity: Inactive (01/14/2024)   Exercise Vital Sign    Days of Exercise per Week: 0 days    Minutes of Exercise per Session: Not on file  Stress: No Stress Concern Present (01/14/2024)   [employer] of Occupational Health - Occupational Stress Questionnaire    Feeling of Stress: Only a little  Social Connections: Unknown (01/14/2024)   Social Connection and Isolation Panel    Frequency of Communication with Friends and Family: Never    Frequency of Social Gatherings with Friends and Family: Patient declined    Attends Religious Services: Never    Database administrator or  Organizations: No    Attends Engineer, structural: Not on file    Marital Status: Widowed  Intimate Partner Violence: Patient Declined (08/04/2023)   Humiliation, Afraid, Rape, and Kick questionnaire    Fear of Current or Ex-Partner: Patient declined    Emotionally Abused: Patient declined    Physically Abused: Patient declined    Sexually Abused: Patient  declined    Review of Systems  All other systems reviewed and are negative.       Objective    BP (!) 157/89   Pulse 77   Ht 5' 3 (1.6 m)   Wt 149 lb 3.2 oz (67.7 kg)   SpO2 94%   BMI 26.43 kg/m   Physical Exam Vitals and nursing note reviewed.  Constitutional:      General: She is not in acute distress. HENT:     Ears:     Comments: Bilateral hearing aids  Cardiovascular:     Rate and Rhythm: Normal rate and regular rhythm.  Pulmonary:     Effort: Pulmonary effort is normal.     Breath sounds: Normal breath sounds.  Abdominal:     Palpations: Abdomen is soft.     Tenderness: There is no abdominal tenderness.  Musculoskeletal:     Comments: Utilizing seated walker - patient with back brace on  Neurological:     General: No focal deficit present.     Mental Status: She is alert and oriented to person, place, and time.         Assessment & Plan:   1. Primary hypertension (Primary) Slightly elevated readings. Continue   2. Hyperlipidemia, unspecified hyperlipidemia type Continue   3. Osteoporosis, unspecified osteoporosis type, unspecified pathological fracture presence  4. Chronic midline low back pain without sciatica Continue with tramadol  - 1-2 daily prn  5. Decreased hearing of both ears Patient to follow up with audiologist  Return in about 3 months (around 04/20/2024).   Tanda Raguel SQUIBB, MD

## 2024-01-27 DIAGNOSIS — H353221 Exudative age-related macular degeneration, left eye, with active choroidal neovascularization: Secondary | ICD-10-CM | POA: Diagnosis not present

## 2024-04-12 ENCOUNTER — Encounter: Payer: Self-pay | Admitting: Physician Assistant

## 2024-04-12 ENCOUNTER — Other Ambulatory Visit

## 2024-04-12 ENCOUNTER — Ambulatory Visit: Admitting: Physician Assistant

## 2024-04-12 DIAGNOSIS — M1711 Unilateral primary osteoarthritis, right knee: Secondary | ICD-10-CM | POA: Diagnosis not present

## 2024-04-12 DIAGNOSIS — M25511 Pain in right shoulder: Secondary | ICD-10-CM | POA: Diagnosis not present

## 2024-04-12 MED ORDER — METHYLPREDNISOLONE ACETATE 40 MG/ML IJ SUSP
40.0000 mg | INTRAMUSCULAR | Status: AC | PRN
  Administered 2024-04-12: 40 mg via INTRA_ARTICULAR

## 2024-04-12 MED ORDER — LIDOCAINE HCL 1 % IJ SOLN
3.0000 mL | INTRAMUSCULAR | Status: AC | PRN
  Administered 2024-04-12: 3 mL

## 2024-04-12 NOTE — Progress Notes (Signed)
 Office Visit Note   Patient: Jessica [name]           Date of Birth: 06-Mar-1930           MRN: [id number] Visit Date: 04/12/2024              Requested by: Blush Bleacher, MD [address] [place],  [place] [address] PCP: Blush Bleacher, MD   Assessment & Plan: Visit Diagnoses:  1. Right shoulder pain, unspecified chronicity   2. Unilateral primary osteoarthritis, right knee     Plan: Pleasant 89 year old woman comes in today with right knee pain.  She has advanced arthritis especially of the patellofemoral joint.  She did try Durolane back in May did not think it was much help.  Will go forward with an steroid injection today talked about wearing a supportive brace and continuing exercises she has learned for patellofemoral arthritis.  Also comes in with right shoulder pain.  Seems to be more rotator cuff related over the [facility] joint and into the rotator cuff.  Will try a subacromial injection if this does not help her would refer her for a glenohumeral injection  Follow-Up Instructions: Return if symptoms worsen or fail to improve.   Orders:  Orders Placed This Encounter  Procedures  . XR Shoulder Right   No orders of the defined types were placed in this encounter.     Procedures: Large Joint Inj: R knee on 04/12/2024 11:29 AM Indications: pain and diagnostic evaluation Details: 25 G 1.5 in needle, anteromedial approach  Arthrogram: No  Medications: 40 mg methylPREDNISolone  acetate 40 MG/ML; 3 mL lidocaine  1 % Outcome: tolerated well, no immediate complications Procedure, treatment alternatives, risks and benefits explained, specific risks discussed. Consent was given by the patient.    Large Joint Inj: R subacromial bursa on 04/12/2024 11:30 AM Indications: diagnostic evaluation and pain Details: 25 G 1.5 in needle, posterior approach  Arthrogram: No  Medications: 40 mg methylPREDNISolone  acetate 40 MG/ML Outcome: tolerated well, no immediate  complications Procedure, treatment alternatives, risks and benefits explained, specific risks discussed. Consent was given by the patient.       Clinical Data: No additional findings.   Subjective: Chief Complaint  Patient presents with  . Right Shoulder - Pain  . Right Knee - Pain    HPI pleasant 89 year old woman comes in today complaining of right shoulder pain and right knee pain.  Denies any injuries has had difficulties with the right knee in the past and had injections viscosupplementation was not particular helpful for her  Review of Systems  All other systems reviewed and are negative.    Objective: Vital Signs: There were no vitals taken for this visit.  Physical Exam Constitutional:      Appearance: Normal appearance.  Pulmonary:     Effort: Pulmonary effort is normal.  Skin:    General: Skin is warm and dry.  Neurological:     General: No focal deficit present.     Mental Status: She is alert and oriented to person, place, and time.  Psychiatric:        Mood and Affect: Mood normal.     Ortho Exam Right knee no effusion no erythema very limited patellofemoral mobility.  No redness compartments are soft and nontender she has more pain with flexion of the knee. Right shoulder she has pain with forward elevation internal rotation behind her back positive empty can sign she does also have some tenderness with external rotation of  her shoulder and is mildly stiff.  She is neurovascular intact Specialty Comments:  No specialty comments available.  Imaging: XR Shoulder Right Result Date: 04/12/2024 Radiographs of her shoulder demonstrate degenerative changes AC and glenohumeral joint no acute fractures    PMFS History: Patient Active Problem List   Diagnosis Date Noted  . Unilateral primary osteoarthritis, right knee 09/17/2023  . T12 compression fracture (HCC) 08/04/2023  . Hyponatremia 08/04/2023  . Dehydration 08/04/2023  . Type 2 diabetes  mellitus without complication, without long-term current use of insulin  (HCC) 08/01/2020  . Palpitations 11/13/2019  . Chronic kidney disease, stage 3a (HCC) 10/05/2019  . History of iron deficiency anemia 02/16/2016  . 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 Hiatal hernia - 1o cm 11/20/2015  . BMI 30.0-30.9,adult 11/14/2014  . Lymphocytic colitis   . Overactive bladder   . Impaired hearing   . Glaucoma   . Cataracts, bilateral   . Constipation   . Thoracic back pain   . GERD (gastroesophageal reflux disease)   . Cystocele   . Low bone mass   . Prediabetes   . Osteoarthritis   . COLONIC POLYPS, ADENOMATOUS 09/09/2007  . Hyperlipidemia 09/09/2007  . HTN (hypertension) 09/09/2007   Past Medical History:  Diagnosis Date  . Allergy   . Back pain    herniated disc  . Basal cell carcinoma of face 11/2014   LEFT Temple  . Cataracts, bilateral    bilateral removed  . Colon polyps   . Cystocele   . Essential hypertension, benign    takes Amlodipine  and Prinizide daily  . GERD (gastroesophageal reflux disease)    takes Nexium  daily  . Glaucoma    pt denies  . Hyperlipidemia    takes Pravastatin  daily  . Impaired hearing   . Incontinence    occasionally  . Iron deficiency anemia due to chronic blood loss 02/16/2016   Due to chronic cameron ulcers. See GI notes. Continue PPI and iron supplementation.  . Low bone mass 02/2008  . Lymphocytic colitis   . Osteoarthritis    hip, knee; severe  . Osteoporosis   . Postoperative anemia due to acute blood loss 09/26/2011  . Type II or unspecified type diabetes mellitus without mention of complication, not stated as uncontrolled 08/2010   Diet controlled    Family History  Problem Relation Age of Onset  . Stroke Mother 63  . Hypertension Mother   . Heart disease Father 68       Endocarditis  . Rheumatic fever Father   . Obesity Daughter        unable to walk very far  . Arthritis Daughter   . Anesthesia problems Neg Hx   .  Hypotension Neg Hx   . Pseudochol deficiency Neg Hx   . Malignant hyperthermia Neg Hx   . Colon cancer Neg Hx   . Esophageal cancer Neg Hx   . Pancreatic cancer Neg Hx   . Rectal cancer Neg Hx   . Stomach cancer Neg Hx     Past Surgical History:  Procedure Laterality Date  . BASAL CELL CARCINOMA EXCISION Left 12/14/2014   LEFT temple  . cataract surgery 04/25/13; 05/16/2013    . COLONOSCOPY    . IR KYPHO EA ADDL LEVEL THORACIC OR LUMBAR  08/10/2023  . IR KYPHO THORACIC WITH BONE BIOPSY  08/10/2023  . JOINT REPLACEMENT N/A    Phreesia 07/29/2020  . TOE SURGERY     bilateral  . TONSILLECTOMY AND ADENOIDECTOMY    .  TOTAL HIP ARTHROPLASTY  09/24/2011   Procedure: TOTAL HIP ARTHROPLASTY;  Surgeon: Toribio JULIANNA Chancy, MD;  Location: [facility] OR;  Service: Orthopedics;  Laterality: Right;   Social History   Occupational History  . Occupation: Retired Print production planner  Tobacco Use  . Smoking status: Former    Current packs/day: 0.00    Types: Cigarettes    Quit date: 07/07/1995    Years since quitting: 28.7  . Smokeless tobacco: Never  Vaping Use  . Vaping status: Never Used  Substance and Sexual Activity  . Alcohol use: No  . Drug use: No  . Sexual activity: Never

## 2024-04-13 ENCOUNTER — Other Ambulatory Visit: Payer: Self-pay | Admitting: Family Medicine

## 2024-04-13 ENCOUNTER — Encounter: Payer: Self-pay | Admitting: Family Medicine

## 2024-04-13 DIAGNOSIS — E785 Hyperlipidemia, unspecified: Secondary | ICD-10-CM

## 2024-04-13 DIAGNOSIS — I10 Essential (primary) hypertension: Secondary | ICD-10-CM

## 2024-04-14 ENCOUNTER — Other Ambulatory Visit: Payer: Self-pay | Admitting: Family Medicine

## 2024-04-14 MED ORDER — TRAMADOL HCL 50 MG PO TABS: 50.0000 mg | ORAL_TABLET | Freq: Four times a day (QID) | ORAL | 0 refills | Status: DC | PRN

## 2024-04-14 NOTE — Telephone Encounter (Signed)
 Requested Prescriptions  Pending Prescriptions Disp Refills   pravastatin  (PRAVACHOL ) 20 MG tablet [Pharmacy Med Name: PRAVASTATIN  SODIUM 20 MG TAB] 90 tablet 0    Sig: TAKE 1 TABLET BY MOUTH EVERY DAY     Cardiovascular:  Antilipid - Statins Failed - 04/14/2024  4:09 PM      Failed - Lipid Panel in normal range within the last 12 months    Cholesterol, Total  Date Value Ref Range Status  10/29/2022 164 100 - 199 mg/dL Final   LDL Chol Calc (NIH)  Date Value Ref Range Status  10/29/2022 93 0 - 99 mg/dL Final   HDL  Date Value Ref Range Status  10/29/2022 47 >39 mg/dL Final   Triglycerides  Date Value Ref Range Status  10/29/2022 139 0 - 149 mg/dL Final         Passed - Patient is not pregnant      Passed - Valid encounter within last 12 months    Recent Outpatient Visits           2 months ago Primary hypertension   [place] [facility], MD   6 months ago Other chronic pain   [place] [facility], Raguel, MD   6 months ago Other chronic pain   [place] [facility], Raguel, MD   7 months ago Hospital discharge follow-up   [facility], Amy J, NP   1 year ago Type 2 diabetes mellitus with stage 3b chronic kidney disease, without long-term current use of insulin  [facility])   [place] [facility], Raguel, MD               metoprolol  succinate (TOPROL -XL) 25 MG 24 hr tablet [Pharmacy Med Name: METOPROLOL  SUCC ER 25 MG TAB] 90 tablet 0    Sig: TAKE 1 TABLET (25 MG TOTAL) BY MOUTH DAILY     Cardiovascular:  Beta Blockers Failed - 04/14/2024  4:09 PM      Failed - Last BP in normal range    BP Readings from Last 1 Encounters:  01/19/24 (!) 157/89         Passed - Last Heart Rate in normal range    Pulse Readings from Last 1 Encounters:  01/19/24 77         Passed - Valid encounter within last 6 months     Recent Outpatient Visits           2 months ago Primary hypertension   [place] [facility], MD   6 months ago Other chronic pain   [place] [facility], MD   6 months ago Other chronic pain   [place] [facility], MD   7 months ago Hospital discharge follow-up   [facility], Amy J, NP   1 year ago Type 2 diabetes mellitus with stage 3b chronic kidney disease, without long-term current use of insulin  [facility])   [place] [facility], MD

## 2024-04-19 ENCOUNTER — Ambulatory Visit: Admitting: Family Medicine

## 2024-04-19 ENCOUNTER — Encounter: Payer: Self-pay | Admitting: Family Medicine

## 2024-04-19 VITALS — BP 173/76 | HR 68 | Ht 63.0 in | Wt 145.4 lb

## 2024-04-19 DIAGNOSIS — Z23 Encounter for immunization: Secondary | ICD-10-CM | POA: Diagnosis not present

## 2024-04-19 DIAGNOSIS — I10 Essential (primary) hypertension: Secondary | ICD-10-CM | POA: Diagnosis not present

## 2024-04-20 ENCOUNTER — Encounter: Payer: Self-pay | Admitting: Family Medicine

## 2024-04-20 NOTE — Progress Notes (Signed)
 Established Patient Office Visit  Subjective    Patient ID: Jessica [name], female    DOB: May 26, 1930  Age: 89 y.o. MRN: [id number]  CC:  Chief Complaint  Patient presents with   Medical Management of Chronic Issues    Pt would like blood work to check kidney function  Pt also would like flu shot     HPI Jessica [name] presents for routine follow up of hypertension. Patient reports med compliance and denies acute complaints.   Outpatient Encounter Medications as of 04/19/2024  Medication Sig   acetaminophen  (TYLENOL ) 325 MG tablet Take 2 tablets (650 mg total) by mouth every 6 (six) hours as needed for mild pain (pain score 1-3) (or Fever >/= 101).   amLODipine  (NORVASC ) 10 MG tablet TAKE 1 TABLET (10 MG TOTAL) BY MOUTH DAILY. DX: I10   metoprolol  succinate (TOPROL -XL) 25 MG 24 hr tablet TAKE 1 TABLET (25 MG TOTAL) BY MOUTH DAILY   Multiple Vitamin (MULTIVITAMIN WITH MINERALS) TABS tablet Take 1 tablet by mouth daily.   polyethylene glycol powder (GLYCOLAX /MIRALAX ) 17 GM/SCOOP powder Mix as directed and take 17 g by mouth daily.   pravastatin  (PRAVACHOL ) 20 MG tablet TAKE 1 TABLET BY MOUTH EVERY DAY   senna-docusate (SENOKOT-S) 8.6-50 MG tablet Take 1 tablet by mouth at bedtime.   traMADol  (ULTRAM ) 50 MG tablet Take 1 tablet (50 mg total) by mouth every 6 (six) hours as needed.   esomeprazole  (NEXIUM ) 20 MG capsule Take 1 capsule (20 mg total) by mouth daily at 12 noon. (Patient taking differently: Take 20 mg by mouth every evening.)   gabapentin  (NEURONTIN ) 100 MG capsule Take 1 capsule (100 mg total) by mouth 3 (three) times daily as needed (nerve pain).   GLUCOSAMINE PO Take 1 tablet by mouth every evening.   hydrochlorothiazide  (HYDRODIURIL ) 12.5 MG tablet TAKE 1 TABLET BY MOUTH EVERY DAY   No facility-administered encounter medications on file as of 04/19/2024.    Past Medical History:  Diagnosis Date   Allergy    Back pain    herniated disc   Basal cell carcinoma  of face 11/2014   LEFT Temple   Cataracts, bilateral    bilateral removed   Colon polyps    Cystocele    Essential hypertension, benign    takes Amlodipine  and Prinizide daily   GERD (gastroesophageal reflux disease)    takes Nexium  daily   Glaucoma    pt denies   Hyperlipidemia    takes Pravastatin  daily   Impaired hearing    Incontinence    occasionally   Iron deficiency anemia due to chronic blood loss 02/16/2016   Due to chronic cameron ulcers. See GI notes. Continue PPI and iron supplementation.   Low bone mass 02/2008   Lymphocytic colitis    Osteoarthritis    hip, knee; severe   Osteoporosis    Postoperative anemia due to acute blood loss 09/26/2011   Type II or unspecified type diabetes mellitus without mention of complication, not stated as uncontrolled 08/2010   Diet controlled    Past Surgical History:  Procedure Laterality Date   BASAL CELL CARCINOMA EXCISION Left 12/14/2014   LEFT temple   cataract surgery 04/25/13; 05/16/2013     COLONOSCOPY     IR KYPHO EA ADDL LEVEL THORACIC OR LUMBAR  08/10/2023   IR KYPHO THORACIC WITH BONE BIOPSY  08/10/2023   JOINT REPLACEMENT N/A    Phreesia 07/29/2020   TOE SURGERY     bilateral  TONSILLECTOMY AND ADENOIDECTOMY     TOTAL HIP ARTHROPLASTY  09/24/2011   Procedure: TOTAL HIP ARTHROPLASTY;  Surgeon: Toribio JULIANNA Chancy, MD;  Location: [facility] OR;  Service: Orthopedics;  Laterality: Right;    Family History  Problem Relation Age of Onset   Stroke Mother 10   Hypertension Mother    Heart disease Father 5       Endocarditis   Rheumatic fever Father    Obesity Daughter        unable to walk very far   Arthritis Daughter    Anesthesia problems Neg Hx    Hypotension Neg Hx    Pseudochol deficiency Neg Hx    Malignant hyperthermia Neg Hx    Colon cancer Neg Hx    Esophageal cancer Neg Hx    Pancreatic cancer Neg Hx    Rectal cancer Neg Hx    Stomach cancer Neg Hx     Social History   Socioeconomic History   Marital  status: Widowed    Spouse name: n/a   Number of children: 2   Years of education: 11th grade   Highest education level: 12th grade  Occupational History   Occupation: Retired Print production planner  Tobacco Use   Smoking status: Former    Current packs/day: 0.00    Types: Cigarettes    Quit date: 07/07/1995    Years since quitting: 28.8   Smokeless tobacco: Never  Vaping Use   Vaping status: Never Used  Substance and Sexual Activity   Alcohol use: No   Drug use: No   Sexual activity: Never  Other Topics Concern   Not on file  Social History Narrative   Widowed - retired Chief of Staff   Daughter (a retired Pensions consultant) lives with patient - she has medical problems related to obesity.   Her son and his family live across the street.   2 caffeine drinks/day      Social Drivers of Corporate investment banker Strain: Low Risk  (04/15/2024)   Overall Financial Resource Strain (CARDIA)    Difficulty of Paying Living Expenses: Not hard at all  Food Insecurity: No Food Insecurity (04/15/2024)   Hunger Vital Sign    Worried About Running Out of Food in the Last Year: Never true    Ran Out of Food in the Last Year: Never true  Transportation Needs: No Transportation Needs (04/15/2024)   PRAPARE - Administrator, Civil Service (Medical): No    Lack of Transportation (Non-Medical): No  Physical Activity: Inactive (04/15/2024)   Exercise Vital Sign    Days of Exercise per Week: 0 days    Minutes of Exercise per Session: Not on file  Stress: No Stress Concern Present (04/15/2024)   [employer] of Occupational Health - Occupational Stress Questionnaire    Feeling of Stress: Not at all  Social Connections: Unknown (04/15/2024)   Social Connection and Isolation Panel    Frequency of Communication with Friends and Family: Patient declined    Frequency of Social Gatherings with Friends and Family: Once a week    Attends Religious Services: Never    Automotive engineer or Organizations: No    Attends Engineer, structural: Not on file    Marital Status: Widowed  Intimate Partner Violence: Patient Declined (08/04/2023)   Humiliation, Afraid, Rape, and Kick questionnaire    Fear of Current or Ex-Partner: Patient declined    Emotionally Abused: Patient declined    Physically  Abused: Patient declined    Sexually Abused: Patient declined    Review of Systems  All other systems reviewed and are negative.       Objective    BP (!) 173/76   Pulse 68   Ht 5' 3 (1.6 m)   Wt 145 lb 6.4 oz (66 kg)   SpO2 95%   BMI 25.76 kg/m   Physical Exam Vitals and nursing note reviewed.  Constitutional:      General: She is not in acute distress. HENT:     Ears:     Comments: Bilateral hearing aids  Cardiovascular:     Rate and Rhythm: Normal rate and regular rhythm.  Pulmonary:     Effort: Pulmonary effort is normal.     Breath sounds: Normal breath sounds.  Abdominal:     Palpations: Abdomen is soft.     Tenderness: There is no abdominal tenderness.  Musculoskeletal:     Comments: Utilizing seated walker   Neurological:     General: No focal deficit present.     Mental Status: She is alert and oriented to person, place, and time.         Assessment & Plan:   Essential hypertension -     Basic metabolic panel with GFR  Encounter for immunization -     Flu vaccine HIGH DOSE PF(Fluzone Trivalent)     Return in about 3 months (around 07/20/2024) for follow up, chronic med issues.   Tanda Raguel SQUIBB, MD

## 2024-05-02 ENCOUNTER — Encounter: Payer: Self-pay | Admitting: Radiology

## 2024-06-29 ENCOUNTER — Other Ambulatory Visit: Payer: Self-pay | Admitting: Family Medicine

## 2024-06-29 DIAGNOSIS — E785 Hyperlipidemia, unspecified: Secondary | ICD-10-CM

## 2024-07-10 ENCOUNTER — Other Ambulatory Visit: Payer: Self-pay | Admitting: Family

## 2024-07-10 ENCOUNTER — Other Ambulatory Visit: Payer: Self-pay | Admitting: Family Medicine

## 2024-07-10 DIAGNOSIS — I10 Essential (primary) hypertension: Secondary | ICD-10-CM

## 2024-07-15 ENCOUNTER — Other Ambulatory Visit: Payer: Self-pay | Admitting: Family Medicine

## 2024-07-15 NOTE — Telephone Encounter (Signed)
 Requested medications are due for refill today.  yes  Requested medications are on the active medications list.  yes  Last refill. 04/14/2024 #60 0 rf  Future visit scheduled.   yes  Notes to clinic.  Refill not delegated.    Requested Prescriptions  Pending Prescriptions Disp Refills   traMADol  (ULTRAM ) 50 MG tablet [Pharmacy Med Name: TRAMADOL  HCL 50 MG TABLET] 60 tablet 0    Sig: TAKE 1 TABLET BY MOUTH EVERY 6 HOURS AS NEEDED.     Not Delegated - Analgesics:  Opioid Agonists Failed - 07/15/2024  3:15 PM      Failed - This refill cannot be delegated      Failed - Urine Drug Screen completed in last 360 days      Passed - Valid encounter within last 3 months    Recent Outpatient Visits           2 months ago Essential hypertension   [place] [facility], MD   5 months ago Primary hypertension   [place] [facility], MD   9 months ago Other chronic pain   [place] [facility], MD   9 months ago Other chronic pain   [place] [facility], MD   10 months ago Hospital discharge follow-up   [facility], Greig PARAS, NP

## 2024-07-18 ENCOUNTER — Encounter: Payer: Self-pay | Admitting: Family Medicine

## 2024-07-19 ENCOUNTER — Other Ambulatory Visit: Payer: Self-pay | Admitting: Family Medicine

## 2024-07-19 MED ORDER — TRAMADOL HCL 50 MG PO TABS: 50.0000 mg | ORAL_TABLET | Freq: Four times a day (QID) | ORAL | 0 refills | Status: AC | PRN

## 2024-07-26 ENCOUNTER — Ambulatory Visit: Admitting: Family Medicine

## 2024-09-06 ENCOUNTER — Ambulatory Visit: Admitting: Family Medicine
